# Patient Record
Sex: Male | Born: 1935 | Race: White | Hispanic: No | Marital: Married | Smoking: Former smoker
Health system: Southern US, Community
[De-identification: ages and names within clinical notes are randomized; demographics above are authoritative.]

## PROBLEM LIST (undated history)

## (undated) DIAGNOSIS — I509 Heart failure, unspecified: Secondary | ICD-10-CM

## (undated) DIAGNOSIS — I2510 Atherosclerotic heart disease of native coronary artery without angina pectoris: Secondary | ICD-10-CM

## (undated) DIAGNOSIS — E119 Type 2 diabetes mellitus without complications: Secondary | ICD-10-CM

## (undated) DIAGNOSIS — K219 Gastro-esophageal reflux disease without esophagitis: Secondary | ICD-10-CM

## (undated) DIAGNOSIS — F32A Depression, unspecified: Secondary | ICD-10-CM

## (undated) DIAGNOSIS — F419 Anxiety disorder, unspecified: Secondary | ICD-10-CM

## (undated) DIAGNOSIS — I10 Essential (primary) hypertension: Secondary | ICD-10-CM

## (undated) DIAGNOSIS — N183 Chronic kidney disease, stage 3 unspecified: Secondary | ICD-10-CM

## (undated) DIAGNOSIS — A159 Respiratory tuberculosis unspecified: Secondary | ICD-10-CM

## (undated) DIAGNOSIS — E1165 Type 2 diabetes mellitus with hyperglycemia: Secondary | ICD-10-CM

## (undated) DIAGNOSIS — J189 Pneumonia, unspecified organism: Secondary | ICD-10-CM

## (undated) DIAGNOSIS — F329 Major depressive disorder, single episode, unspecified: Secondary | ICD-10-CM

## (undated) DIAGNOSIS — E785 Hyperlipidemia, unspecified: Secondary | ICD-10-CM

## (undated) DIAGNOSIS — T6701XA Heatstroke and sunstroke, initial encounter: Secondary | ICD-10-CM

## (undated) DIAGNOSIS — M6200 Separation of muscle (nontraumatic), unspecified site: Secondary | ICD-10-CM

## (undated) DIAGNOSIS — N289 Disorder of kidney and ureter, unspecified: Secondary | ICD-10-CM

## (undated) DIAGNOSIS — C44301 Unspecified malignant neoplasm of skin of nose: Secondary | ICD-10-CM

## (undated) DIAGNOSIS — M545 Low back pain: Secondary | ICD-10-CM

## (undated) DIAGNOSIS — E1129 Type 2 diabetes mellitus with other diabetic kidney complication: Secondary | ICD-10-CM

## (undated) DIAGNOSIS — R972 Elevated prostate specific antigen [PSA]: Secondary | ICD-10-CM

## (undated) DIAGNOSIS — I359 Nonrheumatic aortic valve disorder, unspecified: Secondary | ICD-10-CM

## (undated) DIAGNOSIS — I119 Hypertensive heart disease without heart failure: Secondary | ICD-10-CM

## (undated) HISTORY — PX: SKIN CANCER DESTRUCTION: SHX778

## (undated) HISTORY — DX: Nonrheumatic aortic valve disorder, unspecified: I35.9

## (undated) HISTORY — DX: Disorder of kidney and ureter, unspecified: N28.9

## (undated) HISTORY — DX: Type 2 diabetes mellitus with hyperglycemia: E11.65

## (undated) HISTORY — DX: Gastro-esophageal reflux disease without esophagitis: K21.9

## (undated) HISTORY — DX: Hyperlipidemia, unspecified: E78.5

## (undated) HISTORY — DX: Atherosclerotic heart disease of native coronary artery without angina pectoris: I25.10

## (undated) HISTORY — DX: Low back pain: M54.5

## (undated) HISTORY — DX: Separation of muscle (nontraumatic), unspecified site: M62.00

## (undated) HISTORY — DX: Type 2 diabetes mellitus with other diabetic kidney complication: E11.29

## (undated) HISTORY — DX: Hypertensive heart disease without heart failure: I11.9

## (undated) HISTORY — DX: Elevated prostate specific antigen (PSA): R97.20

---
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
## 1999-03-03 HISTORY — PX: SP INTRO URET CATH PERC: HXRAD350

## 2000-03-18 ENCOUNTER — Inpatient Hospital Stay (HOSPITAL_COMMUNITY): Admission: EM | Admit: 2000-03-18 | Discharge: 2000-03-20 | Payer: Self-pay | Admitting: Emergency Medicine

## 2000-03-18 ENCOUNTER — Encounter: Payer: Self-pay | Admitting: Emergency Medicine

## 2000-03-22 ENCOUNTER — Ambulatory Visit (HOSPITAL_COMMUNITY): Admission: RE | Admit: 2000-03-22 | Discharge: 2000-03-22 | Payer: Self-pay | Admitting: Internal Medicine

## 2000-05-03 HISTORY — PX: CORONARY ANGIOPLASTY WITH STENT PLACEMENT: SHX49

## 2000-05-08 ENCOUNTER — Ambulatory Visit (HOSPITAL_COMMUNITY): Admission: RE | Admit: 2000-05-08 | Discharge: 2000-05-09 | Payer: Self-pay | Admitting: Cardiology

## 2000-10-31 HISTORY — PX: TRANSURETHRAL RESECTION OF PROSTATE: SHX73

## 2000-11-25 ENCOUNTER — Encounter (INDEPENDENT_AMBULATORY_CARE_PROVIDER_SITE_OTHER): Payer: Self-pay | Admitting: Specialist

## 2000-11-25 ENCOUNTER — Inpatient Hospital Stay (HOSPITAL_COMMUNITY): Admission: RE | Admit: 2000-11-25 | Discharge: 2000-11-27 | Payer: Self-pay | Admitting: Urology

## 2002-05-06 DIAGNOSIS — G4730 Sleep apnea, unspecified: Secondary | ICD-10-CM | POA: Insufficient documentation

## 2004-11-13 DIAGNOSIS — H5410 Blindness, one eye, low vision other eye, unspecified eyes: Secondary | ICD-10-CM | POA: Insufficient documentation

## 2004-11-13 DIAGNOSIS — N400 Enlarged prostate without lower urinary tract symptoms: Secondary | ICD-10-CM | POA: Insufficient documentation

## 2005-04-25 DIAGNOSIS — Z125 Encounter for screening for malignant neoplasm of prostate: Secondary | ICD-10-CM | POA: Insufficient documentation

## 2006-06-17 DIAGNOSIS — E785 Hyperlipidemia, unspecified: Secondary | ICD-10-CM | POA: Insufficient documentation

## 2007-06-27 ENCOUNTER — Ambulatory Visit: Payer: Self-pay | Admitting: Cardiology

## 2007-06-27 ENCOUNTER — Inpatient Hospital Stay (HOSPITAL_COMMUNITY): Admission: EM | Admit: 2007-06-27 | Discharge: 2007-06-30 | Payer: Self-pay | Admitting: Emergency Medicine

## 2007-06-30 ENCOUNTER — Encounter (INDEPENDENT_AMBULATORY_CARE_PROVIDER_SITE_OTHER): Payer: Self-pay | Admitting: Internal Medicine

## 2007-09-26 DIAGNOSIS — R609 Edema, unspecified: Secondary | ICD-10-CM | POA: Insufficient documentation

## 2007-09-30 DIAGNOSIS — R809 Proteinuria, unspecified: Secondary | ICD-10-CM | POA: Insufficient documentation

## 2010-08-10 DIAGNOSIS — G4730 Sleep apnea, unspecified: Secondary | ICD-10-CM

## 2010-08-10 DIAGNOSIS — E785 Hyperlipidemia, unspecified: Secondary | ICD-10-CM

## 2010-08-10 DIAGNOSIS — N183 Chronic kidney disease, stage 3 unspecified: Secondary | ICD-10-CM | POA: Insufficient documentation

## 2010-08-10 DIAGNOSIS — I2510 Atherosclerotic heart disease of native coronary artery without angina pectoris: Secondary | ICD-10-CM

## 2010-08-10 DIAGNOSIS — N400 Enlarged prostate without lower urinary tract symptoms: Secondary | ICD-10-CM

## 2010-08-10 DIAGNOSIS — I10 Essential (primary) hypertension: Secondary | ICD-10-CM

## 2010-08-10 DIAGNOSIS — M6200 Separation of muscle (nontraumatic), unspecified site: Secondary | ICD-10-CM

## 2010-08-10 DIAGNOSIS — L723 Sebaceous cyst: Secondary | ICD-10-CM

## 2010-08-10 DIAGNOSIS — R609 Edema, unspecified: Secondary | ICD-10-CM

## 2010-08-10 DIAGNOSIS — Z79899 Other long term (current) drug therapy: Secondary | ICD-10-CM

## 2010-08-10 DIAGNOSIS — R413 Other amnesia: Secondary | ICD-10-CM

## 2010-08-10 DIAGNOSIS — R972 Elevated prostate specific antigen [PSA]: Secondary | ICD-10-CM

## 2010-08-10 DIAGNOSIS — L570 Actinic keratosis: Secondary | ICD-10-CM

## 2010-08-10 DIAGNOSIS — R490 Dysphonia: Secondary | ICD-10-CM

## 2010-08-10 DIAGNOSIS — N289 Disorder of kidney and ureter, unspecified: Secondary | ICD-10-CM

## 2010-08-10 DIAGNOSIS — Z125 Encounter for screening for malignant neoplasm of prostate: Secondary | ICD-10-CM

## 2010-08-10 DIAGNOSIS — R809 Proteinuria, unspecified: Secondary | ICD-10-CM

## 2010-08-10 DIAGNOSIS — E1165 Type 2 diabetes mellitus with hyperglycemia: Secondary | ICD-10-CM

## 2010-08-15 NOTE — H&P (Signed)
NAMEGABRIELLA, GUILE                ACCOUNT NO.:  [id number]   MEDICAL RECORD NO.:  [id number]          PATIENT TYPE:  EMS   LOCATION:  MAJO                         FACILITY:  [facility]   PHYSICIAN:  Lonia Blood, M.D.      DATE OF BIRTH:  February 20, 1929   DATE OF ADMISSION:  06/27/2007  DATE OF DISCHARGE:                              HISTORY & PHYSICAL   PRIMARY CARE PHYSICIAN:  Lenon Curt. Chilton Si, M.D.   PRESENTING COMPLAINT:  Dizziness.   HISTORY OF PRESENT ILLNESS:  The patient is a 77 year old gentleman with  a history of hypertension and diabetes who has apparently been on a diet  for the past 4 months.  He has lost about 40 pounds within this period.  In the last month alone he has lost about 15 pounds.  The patient has  experienced slight dizziness in the past 3 weeks.  He went to see his  primary care physician today, that is Dr. Chilton Si, who reviewed his  medications.  At that time his blood pressure systolic according to the  patient was about 100.  While in the office the dose of his Hyzaar was  halved secondary to presumed low blood pressure and he was asked to  return in about a month.  After leaving the office the patient felt  dizzy and weak.  He checked his blood pressure at home and the systolic  blood pressure was in the 80s.  He called his doctor and was asked to  come to the hospital.  Here in the ED his blood pressure on arrival was  found to be 88/48.  He denied any chest pain.  No fever.  No recent  nausea, vomiting or diarrhea.  No abdominal pain.  The patient denied  any falls.   PAST MEDICAL HISTORY:  Significant for dyslipidemia, hypertension,  diabetes and reflux.   ALLERGIES:  He has no known drug allergies.   MEDICATIONS:  Include Hyzaar, atenolol, metformin, omeprazole and  simvastatin.   SOCIAL HISTORY:  The patient lives with his wife.  He denied any tobacco  or alcohol or IV drug use.   FAMILY HISTORY:  Is noncontributory due to the patient's age.   REVIEW OF SYSTEMS:  A 12 point review of systems performed is  essentially negative except per HPI.   PHYSICAL EXAMINATION:  VITAL SIGNS:  On his exam temperature 98.6.  Initial blood pressure 88/48, blood pressure has dropped to 73/41, pulse  68, respiratory rate of 12, sats 96% on room air.  GENERAL:  He is a pleasant man in no acute distress.  HEENT:  PERRL.  EOMI.  NECK:  Neck is supple.  No JVD, no lymphadenopathy.  RESPIRATORY:  He has good air entry bilaterally.  No wheezes, no rales.  CARDIOVASCULAR:  He has S1-S2.  No murmurs.  ABDOMEN:  Abdomen is soft, nontender with positive bowel sounds.  EXTREMITIES:  Extremities show no edema, cyanosis or clubbing.   LABS:  White count is 8.2, hemoglobin 13.0, platelet count 186.  Initial  cardiac enzymes negative.  Sodium 137, potassium 3.4, chloride 102,  BUN  35, creatinine 2.7, glucose 126, calcium ionized 0.99.  Chest x-ray  shows old granulomatous disease but no acute findings.  EKG shows normal  sinus rhythm with a rate of 71, poor R-wave progression and slightly low  voltage but no ST-T wave changes.  EKG is unchanged from previous in  August 2002.   ASSESSMENT:  Therefore, this is a 77 year old gentleman presenting with  hypotension.  His hypotension is most likely secondary to multiple blood  pressure medications in the setting of weight loss.  The fact that the  patient has lost significant weight in the past 4 months makes him  require less medication to control his blood pressure and most likely  this contributed to this hypotension.  He also has an element of acute  renal failure as according to the patient his labs have always been  fine.  We do not have an old creatinine but 2.7 is today.   PLAN:  1. Hypotension.  Will admit the patient, hold all antihypertensive      medications.  Hydrate him extensively with boluses of normal saline      and continue at high dose of 250 an hour.  We will check serial      cardiac  enzymes to rule out other differentials including acute      coronary syndrome.  Will also follow his renal function closely.  2. Acute renal failure.  Again, I believe this is prerenal.  Will      hydrate the patient and recheck his renal function.  If there is no      improvement we will do a full renal workup.  3. Hypokalemia.  This seems to be probably primarily from his      diuresis.  I will therefore replete his potassium.  4. Dyslipidemia.  Will check fasting lipid panel and continue with his      home medications as necessary.  5. Gastroesophageal reflux disease.  I will continue with his PPI in      the hospital.   Further treatment will depend on how the patient responds to initial  treatment in the hospital.      Lonia Blood, M.D.  Electronically Signed     LG/MEDQ  D:  06/27/2007  T:  06/29/2007  Job:  [id number]

## 2010-08-15 NOTE — Discharge Summary (Signed)
Jimmy Dawson, PRIES                ACCOUNT NO.:  [id number]   MEDICAL RECORD NO.:  [id number]          PATIENT TYPE:  INP   LOCATION:  6742                         FACILITY:  [facility]   PHYSICIAN:  Marcellus Scott, MD     DATE OF BIRTH:  08-25-1928   DATE OF ADMISSION:  06/27/2007  DATE OF DISCHARGE:  06/30/2007                               DISCHARGE SUMMARY   PRIMARY CARE PHYSICIAN:  Lenon Curt. Chilton Si, M.D.   CARDIOLOGIST:  Jaclyn Prime. Lucas Mallow, M.D.   DISCHARGE DIAGNOSES:  1. Hypotension.  2. Acute renal failure - resolved.  3. Coronary artery disease status post stent.  4. An episode of 8 beats nonsustained ventricular tachycardia.  5. Type 2 diabetes mellitus.  6. Hyperlipidemia.  7. Anemia.  8. Urinary tract infection.   DISCHARGE MEDICATIONS:  1. Atenolol 50 mg p.o. daily.  2. Metformin 500 mg p.o. daily.  3. Omeprazole 40 mg p.o. daily.  4. Simvastatin 80 mg p.o. daily.  5. Lasix 40 mg p.o. p.r.n.  6. Aspirin enteric-coated 325 mg p.o. daily.  7. Multivitamins one p.o. daily.  8. Metamucil.  9. Cipro 250 mg p.o. twice daily for 3 days.   MEDICATION THAT IS HELD:  Hyzaar.   PROCEDURE:  1. 2-D echocardiogram.  Summary:  Overall left ventricular systolic      function was normal.  Left ventricular ejection fraction was      estimated at range being 55-60%.  There were no left ventricular      regional wall motion abnormalities.  Left ventricular wall      thickness was mildly increased.  Noncoronary cusp appears to be      calcified with reduced excursions.  There was trivial aortic      valvular regurgitation.  There was mild mitral annular      calcification.  The left atrium was mildly dilated.  The estimated      peak pulmonary artery systolic pressure was mildly increased.  2. Chest x-ray on June 27, 2007.  Impression:  Old granulomatous      disease.  No acute thoracic findings are identified.   PERTINENT LABORATORY DATA:  Urine culture greater than 100,000 E coli.  Basic metabolic panel today was unremarkable with BUN 13, creatinine  0.95.  CBC with hemoglobin 11.9, hematocrit 35.3, white blood cell 6.9,  platelets 159.  Troponin was negative and so were the rest of the  cardiac enzymes.  Serum cortisol 5.8.  TSH 1.454.  Hemoglobin A1c 6.7.  Lipid panel with cholesterol 82, triglyceride 61, HDL 22, LDL 48, VLDL  is 12.  BNP of less than 30.  Urinalysis with small leukocytes, 3-6  white blood cells and rare bacteria.   CONSULTATIONS:  None.   HOSPITAL COURSE AND PATIENT DISPOSITION:  Please refer to the history  and physical note for initial admission details.  In summary, Jimmy Dawson  is a 77 year old gentleman with history of hypertension, diabetes,  coronary artery disease who has been dieting for 4 months and has  intentionally lost about 40 pounds of weight.  He had been experiencing  dizziness in the past 3 weeks and saw his primary care physician who  reviewed his medications, and his blood pressure was noted to be 100  mmHg systolic following which his Hyzaar was reduced and he was asked to  return in a month's time.  The patient indicates that over the last few  weeks, the patient has been running low blood pressures in the 80s even  at home.  In any event, on further evaluation in the ED, he was found to  have a blood pressure of 88/48 and even dropped down to 73/41 mmHg, and  he had acute renal failure with BUN of 35, creatinine of 2.7.  Following  this, the patient was admitted to the hospital for further evaluation  and management.   Problem 1.  Hypotension.  This was most likely secondary to his  antihypertensive medications and dehydration.  The patient's  antihypertensive medications were held.  The patient was hydrated with  IV normal saline with improvement in his blood pressures.  Then his  atenolol was started at a low-dose which he has tolerated and no further  symptoms.   Problem 2.  Acute renal failure secondary to ARB and  dehydration.  The  patient's medications were held.  The patient was hydrated with IV  fluids, and there was resolution of his acute renal failure.   Problem 3.  Coronary artery disease status post stent.  No symptoms.  Echocardiogram was done with good LV function.  The patient is to follow  up with his cardiologist and continue his home medications.   Problem 4.  Episode of nonsustained ventricular tachycardia,  asymptomatic.  The patient's electrolytes were checked and repleted as  needed.  His echocardiogram was checked, and he has normal LV function.  The patient is to continue his beta blockers and follow up with his  cardiologist.   Problem 5.  Type 2 diabetes.  Will need tighter control as an  outpatient.   Problem 6.  Urinary tract infection in a male patient with significant  urine analysis.  Will treat empirically with 3 days of Cipro and  followup.      Marcellus Scott, MD  Electronically Signed     AH/MEDQ  D:  06/30/2007  T:  06/30/2007  Job:  [id number]   cc:   Lenon Curt Chilton Si, M.D.  Jaclyn Prime. Lucas Mallow, M.D.

## 2010-08-18 NOTE — Cardiovascular Report (Signed)
[place]. [facility]  Patient:    Jimmy Dawson, Jimmy Dawson                  MRN: [id number] Proc. Date: 05/08/00 Adm. Date:  [id number] Attending:  Clovis Cao CC:         Trudee Kuster, M.D.  Cath Lab   Cardiac Catheterization  PROCEDURE: 1. Left heart catheterization. 2. Coronary coronary angiography. 3. Left internal mammary artery coronary angiography. 4. Left ventricular coronary angiography. 5. Abdominal aortogram. 6. Angioplasty with PTCA and stent placement of the mid LAD. 7. Perclose of the right femoral artery.  INDICATIONS:  This 77 year old male patient of Dr. Jearl Klinefelter had the recent finding of an abnormal EKG.  An echocardiogram done at [facility] showed left ventricular dysfunction in the anterior segment.  He denies any history of chest pain.  He has a history of hypertension, mild diabetes, and mild problems with his cholesterol.  He has a positive family history of coronary artery disease.  DESCRIPTION OF PROCEDURE:  After signing an informed consent, the patient was premedicated with 50 mg of Benadryl intravenously and brought to the cardiac catheterization lab.  His right groin was prepped and draped in the usual sterile fashion and anesthetized locally with 1% lidocaine.  A 6 French introducer sheath was inserted percutaneously into the right femoral artery. A 6 French #4 Judkins coronary catheters were used to make injections into the coronary arteries.  The right coronary catheter was used to make a midstream injection into the left subclavian artery visualizing the left internal mammary artery.  A 6 French pigtail catheter was used to measure pressures in the left ventricle and aorta and to make midstream injections into the left ventricle and abdominal aorta.  After noting total occlusion of his mid-LAD we discussed this finding with the patient and with his permission, we proceeded with an angioplasty  procedure.  We selected a 6 [place] guide catheter which was advanced to the root of the aorta.  The Hi-Torque floppy guide wire was then inserted through the guide catheter and advanced into the LAD.  After mild difficulty it was passed through the totally occluded lesion in the mid LAD and safely positioned in the distal LAD.  We then selected a 3.0 x 15 mm crossail balloon catheter which was advanced over the guide wire and positioned within the LAD lesion.  Two inflations were made, the first at 6 atmospheres for 18 seconds and the second at 12 atmospheres for 30 seconds. After this second inflation of the balloon catheter, it was removed and injections showed a partial opening and reestablishment of slow antegrade flow.  Nitroglycerin 100 units intracoronary was then given into the LAD.  We then selected a 3.5 x 13 mm Penta stent deployment system.  After proper preparation, it was inserted over the guide wire in position within the mid LAD lesion.  The stent was then deployed with one inflation at 18 atmospheres for 27 seconds.  After the stent was deployed, the deployment balloon was removed and injection again into the LAD showed an excellent angiographic result with 0% residual lesion and reestablishment of normal Timi 3 antegrade flow.  There was no evidence of clot and no evidence of dissection.  Also noted was a large first anterolateral branch just prior to this lesion which has a severe 95% stenosis at its ostium.  Multiple attempts were made at passing the guide wire into this first anterolateral branch without  success. At the end of the procedure, the catheter and sheath were removed from the right femoral artery and hemostasis was easily obtained with a Perclose closure system.  MEDICATIONS GIVEN:  Heparin 5000 units IV, Integrilin IV bolus and drip per pharmacy protocol.  Nitroglycerin intracoronary 100 units.  HEYMODYNAMIC DATA:  Left ventricular pressure 140/20-38,  aortic pressure 140/74 with a mean of 103.  Left ventricular ejection fraction was estimated at 40%.  CINE FINDINGS:  Coronary coronary angiography.  Left coronary artery.  The ostium and left main appear normal.  Left anterior descending.  The mid LAD is totally occluded and the mid and distal segment is filled retrograde during injections into the right coronary artery.  The first two septal branches appear normal.  The first diagonal branch has a severe 95% stenosis at its ostium.  There was slow antegrade flow.  There were 0 antegrade flow in the mid LAD.  Circumflex coronary artery.  The circumflex coronary artery has a mildly dilated area of ectasia in its middle segment, otherwise, the circumflex appears normal with very good antegrade flow.  Right coronary artery.  The right coronary artery is mildly irregular in its proximal segment.  There are several mild areas of ectasia in the proximal and middle segment.  There is no significant stenotic area.  The distal right coronary artery appears normal and is a large dominant vessel supplying the posterior descending and posterolateral circulation.  The mid distal and second anterolateral branch of the LAD fill retrograde during injections into the right coronary artery.  Left internal mammary artery.  Appears normal.  Left ventricular cineangiogram.  The left ventricular chamber size is mildly enlarged.  The overall left ventricular contractility is mild to moderately decreased with an ejection fraction estimated at approximately 40%.  There is moderate to severe anterior hypokinesia.  Abdominal aortogram.  The abdominal aorta and renal arteries appear normal.  ANGIOPLASTY CINES:  Cines taken during the angioplasty procedure shows proper positioning of the guide wire through the totally occluded mid LAD lesion. Further cines shows partial opening after the balloon angioplasty with Timi 2  antegrade flow.  Further cines  showed proper positioning of the stent deployment system and final injections into the LAD shows an excellent angiographic result with proper positioning of the stent with full deployment and reestablishment of normal Timi 3 antegrade flow.  There is no evidence of dissection or clot.  The first anterolateral branch remained unchanged and no crossing was made with the guide wire.  FINAL DIAGNOSES: 1. Single vessel coronary artery disease with total occlusion of the mid    left anterior descending and 95% lesion in the first diagonal branch. 2. Normal left internal mammary artery. 3. Mild ectasia in the circumflex and right coronary arteries. 4. Moderate left ventricular dysfunction with ejection fraction of    approximately 40% and severe anterior hypokinesia. 5. Normal abdominal aorta and renal arteries. 6. Successful angioplasty with percutaneous transluminal coronary angioplasty    and stenting of the mid left anterior descending lesion. 7. Unsuccessful attempt to cross the first anterolateral lesion. 8. Successful Perclose of the right femoral artery.  DISPOSITION:  The patient was admitted to 6500 for monitoring overnight and will plan to discharge in the a.m. DD:  05/08/00 TD:  05/09/00 Job: [id number]

## 2010-08-18 NOTE — Discharge Summary (Signed)
[place]. [facility]  Patient:    Jimmy Dawson, Jimmy Dawson                  MRN: [id number] Adm. Date:  [id number] Attending:  Clovis Cao CC:         Aram Candela. Aleen Campi, M.D.  Trudee Kuster, M.D.  Veverly Fells. Vernie Ammons, M.D.   Discharge Summary  CHIEF COMPLAINT:  This 77 year old male, patient of Dr. Jearl Klinefelter, was admitted because of recent abnormal findings, including abnormal electrocardiography, shortness of breath with exertion, as well as a strong family history of coronary artery disease.  In light of these findings, he was admitted for cardiac catheterization rather than undergoing stress test beforehand.  PAST MEDICAL HISTORY: FAMILY HISTORY: SOCIAL HISTORY: REVIEW OF SYSTEMS:  Included in the history and physical and copy of the outside consult note to be found in the chart.  PHYSICAL EXAMINATION:  VITAL SIGNS: Blood pressure 120/70, pulse 75 and regular, respirations 18 and unlabored. GENERAL: He was a well-developed, well-nourished man in no acute distress.  His respiratory effort was normal. The apical impulse was cryptic.  The left border of the cardiac illness was ill-defined.  The heart rate, which had been 100 beats per minute when he was seen in the office, was down to 75.  There was no gallop sound.  The remainder of the physical examination was unremarkable.  LABORATORY DATA:  Unremarkable.  HOSPITAL COURSE:  The patient was taken to the cardiac catheterization on May 08, 2000, by Dr. Aleen Campi and cardiac catheterization revealed single vessel coronary artery disease with total occlusion of the mid left anterior descending and 95% stenosis of the first diagonal.  There was moderate left ventricular dysfunction.  Ejection fraction was estimated at 40%, as had been the case in the echocardiogram.  PROCEDURE:  There was successful PTA and stenting of the mid LAD.  There was an unsuccessful attempt to cross the first diagonal  lesion.  There was successful Perclose of the right femoral artery and postoperative antiplatelet therapy was carried out with double bolus Integrilin.  The patient had minimal oozing.  There was Perclose closure.  His postoperative course was unremarkable.  The following morning, he walked well.  He noted that he could already feel a difference in his breathing, and that he can now take deep breaths more easily than he could previously.  The patient was considered stable and ready for discharge after walking without destabilization of the groin.  FINAL DIAGNOSES: 1. Congestive heart failure. 2. Coronary artery disease. 3. Benign prostatic hypertrophy, followed by Dr. Vernie Ammons.  RECOMMENDATIONS:  Diet; cholesterol lowering diet.  Activity; walking as tolerated.  He is to do no heavy lifting for three days.  Wound care; he may drive short distances, especially important in view of his wifes current invalid status.  Smoking; do not smoke.  The essential nature of [place] great objections to his smoking are strongly emphasized.  DISCHARGE MEDICATIONS: 1. Plavix 75 mg daily. 2. Aspirin 325 mg daily. 3. Darvocet-N 100 as needed for pain (he has previously had back pain, but    ibuprofen is now inappropriate). 4. Toprol XL 25 mg daily. 5. Altace 5 mg daily. 6. Proscar. 7. Flomax.  FOLLOW-UP:  With Dr. Lucas Mallow in one week.  CONDITION ON DISCHARGE:  Improved and stable.  PROCEDURE:  Cardiac catheterization with PTCA and stent as noted above. Complications from operation none. DD:  05/09/00 TD:  05/09/00 Job: [id number]

## 2010-08-18 NOTE — Consult Note (Signed)
[place]. [facility]  Patient:    Jimmy Dawson, Jimmy Dawson                  MRN: [id number] Proc. Date: 03/18/00 Adm. Date:  [id number] Attending:  Thedore Mins CC:         Trudee Kuster, M.D.   Consultation Report  REASON FOR CONSULTATION:  Urinary retention.  BRIEF HISTORY:  This nice 77 year old male is admitted to the hospital at this time for acute urinary retention.  He was recently seen in [facility] office for bronchitis.  The patient had a new patient evaluation at that time as well.  He did have mild to moderate voiding symptomatology, specifically nocturia x 2-3, somewhat decreased force of stream, frequency, and mild urgency.  He denies any prior history of kidney infections, bladder infections, or urologic problems other than one past kidney stone and erectile dysfunction.  He was seen within the past two to three years by a urologist in [place]. Apparently his prostate "checked out all right."  After being on Detrol for urinary frequency and being placed on a decongestant the patient developed decreased urinary flow last week and eventually, by Friday, was having difficulty urinating.  He was taken off the Detrol at that time.  A PSA was drawn then returning 22.4.  He continued to have voiding problems over the weekend and presented to Dr. Antonietta Breach office today.  He was sent to the emergency room where a catheter was placed and over 2 L was obtained.  He is admitted at this time for further evaluation and management of an elevated PSA, acute urinary retention, and a possible clinical picture of bowel obstruction.  PHYSICAL EXAMINATION:  GENERAL:  Very pleasant elderly male.  He was eating a liquid diet, sitting at bed side.  VITAL SIGNS:  Blood pressure 130/73, pulse 88, respiratory rate 20.  He is afebrile.  ABDOMEN:  Obese and somewhat distended with tympany in the upper abdomen. There was no rebound,  guarding, or masses.  No hepatosplenomegaly was present. There were no inguinal hernias.  There was no flank masses or CVA tenderness.  GENITOURINARY:  Phallus was uncircumcised.  No plaques, lesions, or fibrotic areas were noted.  Foreskin was easily retracted without lesions.  There was a Foley catheter present at the meatus.  RECTAL:  Normal sphincter tone.  Gland 50 g.  No tenderness.  No nodularity. Nothing suspicious in nature was present on this examination.  IMPRESSION: 1. Acute urinary retention, resolved with Foley catheter placement. 2. Hematuria most likely due to over distention of bladder and mucosal    bleeding, should resolve. 3. Elevated PSA.  I feel that this is most likely due to acute urinary    retention as this was taken Friday after the patient had been on his    anticholinergic and decongestant. 4. Benign prostatic hypertrophy with mild to moderate symptomatology prior to    recent medical management.  PLAN: 1. Stop anticholinergic (Detrol) and decongestant. 2. Start Flomax 0.4 mg q.d.  This will provide an alpha blockade and decrease    outlet resistance. 3. Leave catheter in two to three days.  I think that we should give him a    voiding trial after that with removing the catheter in the morning.  Most    likely this will be able to be done with the patient at home if he goes    home within the next day or two. 4.  Repeat PSA in three to four weeks.  With the PSA bumping up with the    retention and now with urethral catheterization, I feel it is most likely    best to wait quite a while.  I would expect that this PSA will go back down    to normal.  I am not suspicious for prostate cancer at this time. DD:  03/19/00 TD:  03/19/00 Job: [id number]

## 2010-08-18 NOTE — Op Note (Signed)
[facility]  Patient:    Jimmy Dawson, Jimmy Dawson Visit Number: [id number] MRN: [id number]          Service Type: SUR Location: 3W 0349 01 Attending Physician:  Liborio Nixon Proc. Date: 11/25/00 Adm. Date:  11/25/2000   CC:         Lenon Curt. Cassell Clement, M.D.  Jaclyn Prime. Lucas Mallow, M.D.   Operative Report  PREOPERATIVE DIAGNOSIS:  Benign prostatic hypertrophy with retention.  POSTOPERATIVE DIAGNOSIS:  Benign prostatic hypertrophy with retention.  PRINCIPAL PROCEDURE:  TURP.  SURGEON:  Bertram Millard. Dahlstedt, M.D.  ANESTHESIA:  Subarachnoid block.  COMPLICATIONS:  None.  BRIEF HISTORY:  A 77 year old man, who presented to my attention in December of last year with retention.  At that time, he was on an anticholinergic and an alpha agonist for upper respiratory symptoms.  Despite being off of both of these medications, he remained in retention for some time.  He was on intermittent catheterization.  This eventually resolved over a period of a month or two.  However, the patient has had significant urinary symptoms since that time.  His treatment has necessitated Flomax and Proscar for maximal medical management.  Despite this, he has persistent, severe urinary symptoms and desires management.  The risks and complications as well as alternatives of TURP were discussed with the patient.  He understands these and desires to proceed.  DESCRIPTION OF PROCEDURE:  The patient was administered a subarachnoid block. Preoperative IV antibiotics were administered, and he was placed in the dorsal lithotomy position.  Genitalia and perineum were prepped and draped.  The urethra was calibrated to 32 [place] with [employer].  Resectoscope sheath was placed with the element.  Resection was begun at the 12 oclock position from bladder neck down to the verumontanum area.  Visualization was then afforded of both ureteral orifices which were right near a  large, intravesical median lobe.  The median lobe was resected down to the surgical capsule, taking care not to injure either ureteral orifice.  The right and left lateral lobes were then resected down to the surgical capsule, starting anteriorly and working posteriorly.  In this manner, the prostatic fossa opened up quite nicely.  Small bleeders were electrocoagulated.  Chips were removed from the bladder.  The prostate and bladder were reinspected.  No chips were seen, and no bleeding was seen in the prostatic fossa.  At this point, the scope was removed, 24 [place] three-way Foley catheter was placed and the balloon filled with 30 cc of saline.  This was hooked to overhead irrigation.  The patient tolerated the procedure well.  He was taken to PACU in stable condition. Attending Physician:  Liborio Nixon DD:  11/25/00 TD:  11/26/00 Job: [id number]

## 2010-08-18 NOTE — H&P (Signed)
[facility]  Patient:    Jimmy Dawson, Jimmy Dawson                  MRN: [id number] Adm. Date:  [id number] Attending:  Thedore Mins                         History and Physical  CHIEF COMPLAINT: Urinary retention.  HISTORY OF PRESENT ILLNESS: This patient is a 77 year old man seen in the outpatient setting three days ago for complaint of inability to void.  The patient had been started on Detrol approximately two days prior as he was complaining of urinary frequency and some nocturia with no obvious cause.  The patient had examination at that time which did not show any gross abnormalities.  The patient was told to stop the Detrol and we would follow this up.  An attempt was made at in-and-out catheterization during the outpatient visit but this was unsuccessful.  The patient was told to return or come to the ED if he continued to have voiding difficulties, which he in fact did continue to have.  The patient came to the emergency department and after successful Foley catheter placement 2300 cc of urine was obtained.  The patient has also been complaining of inability to have a bowel movement over the past several days to a week.  The patient denies any melena or hematochezia.  The patient denies any nausea or vomiting.  PAST MEDICAL HISTORY: None.  MEDICATIONS:  1. Allegra p.r.n.  2. Recent Detrol samples given.  FAMILY HISTORY: No history of malignancy.  Father had coronary artery disease. Siblings with diabetes and coronary artery disease.  SOCIAL HISTORY: The patient does not use tobacco or alcohol.  He is married. He was a Psychologist, forensic.  REVIEW OF SYSTEMS: As above, the patient had been having some urinary urgency prior to samples of Detrol.  The patient denies any chest pain or shortness of breath.  The patient denies any changes in his weight.  The patient did have episodes of constipation over the past week but typically had normal  bowel habits.  PHYSICAL EXAMINATION:  VITAL SIGNS: The patient is afebrile with stable vital signs.  HEENT: Head normocephalic.  PERRL.  HEART: Regular rhythm.  No murmurs, rubs, or gallops.  LUNGS: Clear bilaterally.  ABDOMEN: Distended, with positive bowel sounds.  EXTREMITIES: No clubbing, cyanosis, or edema.  GU/RECTAL: Prostate smooth and round, no obvious mass per my examination or per examination of EDP.  Hematuria in Foley bag after initial clear urine. LABORATORY DATA: KUB shows distended bowel.  CT scan was done without IV contrast secondary to creatinine of 2.3 but no masses were shown, but a bowel obstruction or dilatation was evident.  PSA level from March 15, 2000 was 22.4.  ASSESSMENT/PLAN:  1. Urinary retention.  The patient had urinary retention which is probably a     combination of an enlarged prostate and Detrol.  The patient did retain     2300 cc of urine so a Foley will need to be placed and remain for several     days.  Urology service consultation will be obtained to clarify issues of     prostatic specific antigen being so increased, and the patients symptoms.     The patient probably will need to have some type of study performed by     urology and I will defer to their opinion.  2. Bowel obstruction.  This is of some concern, as the patient could possibly     have prostate cancer.  The patient does have positive bowel sounds and did     have a bowel movement prior to my examining him.  For now we will place     the patient on a clear liquid diet and advance this as tolerated.     Should this problem continue, then the patient may need general surgery     service evaluation.  3. Acute renal insufficiency.  The patient has a creatinine of 2.3 on the     obtained i-STAT.  His initial creatinine was 1.6 in my office.  The     patient has no history of renal problems, so the most likely culprit is     acute retention/obstruction.  We will follow this  during his     hospitalization.  I suspect this will resolve with hydration and time. D:  03/18/00 TD:  03/19/00 Job: [id number]

## 2010-08-18 NOTE — Discharge Summary (Signed)
[facility]  Patient:    TREVIOUS, RAMPEY Visit Number: [id number] MRN: [id number]          Service Type: SUR Location: 3W 0349 01 Attending Physician:  Liborio Nixon Dictated by:   Bertram Millard. Dahlstedt, M.D. Admit Date:  11/25/2000 Discharge Date: 11/27/2000                             Discharge Summary  PRIMARY DIAGNOSES: 1. Benign prostatic hypertrophy. 2. Retention, specified. 3. Coronary artery disease. 4. History of old myocardial infarction. 5. Diabetes mellitus type 2, controlled. 6. Hypertension.  PRINCIPAL PROCEDURE:  November 25, 2000: Transurethral resection of prostate.  BRIEF HISTORY:  This is an elderly male with obstructive uropathy. He has been followed by Dr. Retta Diones for quite a few months for urinary retention which eventually resolved. He still has significant lower urinary tract symptoms and is on both Flomax and Proscar for maximal medical management. He has persistent severe voiding symptoms and at this time, he desires surgical management. He is aware of transurethral resection of the prostate, as well a other alternatives including incision of the prostate, microwave and radio frequency ablation. He desires to proceed with the surgical management.  HOSPITAL COURSE:  The patient was admitted to Dr. Barbra Sarks service and underwent transurethral resection of the prostate. He tolerated the procedure well. Postoperative course was uncomplicated. He was treated first with bladder irrigations, and then with just catheter drainage. His catheter was removed on postoperative day #2. At that time, he was voiding well without difficulty.  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  As he was doing well, he was discharged from the hospital.  DISCHARGE FOLLOWUP:   He will follow up with Dr. Retta Diones in approximately two  weeks.  CONDITION ON DISCHARGE:  Improved.  ACTIVITY:  Discharge on a limited activities.  DISCHARGE MEDICATIONS:  Regular preoperative medications plus Bactrim DS one p.o. q.d. x 10 days. Dictated by:   Bertram Millard. Dahlstedt, M.D. Attending Physician:  Liborio Nixon DD:  12/18/00 TD:  12/18/00 Job: [phone]

## 2010-12-25 LAB — CBC
HCT: 35.3 — ABNORMAL LOW
Hemoglobin: 12.1 — ABNORMAL LOW
MCHC: 33.6
MCV: 88
MCV: 88.6
Platelets: 159
Platelets: 186
RBC: 4.03 — ABNORMAL LOW
RDW: 13.3
RDW: 13.4
RDW: 13.6
WBC: 6.9
WBC: 8.2

## 2010-12-25 LAB — POCT I-STAT, CHEM 8
HCT: 39
Hemoglobin: 13.3
Potassium: 3.4 — ABNORMAL LOW
Sodium: 137
TCO2: 26

## 2010-12-25 LAB — BASIC METABOLIC PANEL
BUN: 13
BUN: 14
CO2: 30
Calcium: 8.2 — ABNORMAL LOW
Calcium: 8.9
Chloride: 105
Creatinine, Ser: 0.95
Creatinine, Ser: 1.13
GFR calc Af Amer: 49 — ABNORMAL LOW
GFR calc Af Amer: >60
GFR calc non Af Amer: 40 — ABNORMAL LOW
GFR calc non Af Amer: >60
Glucose, Bld: 137 — ABNORMAL HIGH
Sodium: 136

## 2010-12-25 LAB — LIPID PANEL
Cholesterol: 82
HDL: 22 — ABNORMAL LOW
LDL Cholesterol: 48
Total CHOL/HDL Ratio: 3.7

## 2010-12-25 LAB — TROPONIN I: Troponin I: 0.03

## 2010-12-25 LAB — DIFFERENTIAL
Basophils Absolute: 0.1
Basophils Relative: 1
Eosinophils Absolute: 0.1
Lymphs Abs: 2.1
Neutrophils Relative %: 63

## 2010-12-25 LAB — URINE MICROSCOPIC-ADD ON

## 2010-12-25 LAB — TSH: TSH: 1.454

## 2010-12-25 LAB — CORTISOL-AM, BLOOD: Cortisol - AM: 5.8

## 2010-12-25 LAB — URINALYSIS, ROUTINE W REFLEX MICROSCOPIC
Hgb urine dipstick: NEGATIVE
Specific Gravity, Urine: 1.021
pH: 5

## 2010-12-25 LAB — CK TOTAL AND CKMB (NOT AT ARMC)
CK, MB: 2.3
Relative Index: 1.7
Total CK: 121

## 2010-12-25 LAB — URINE CULTURE

## 2010-12-25 LAB — POCT CARDIAC MARKERS: CKMB, poc: <1 — ABNORMAL LOW

## 2010-12-25 LAB — MAGNESIUM: Magnesium: 2.1

## 2010-12-25 LAB — HEMOGLOBIN A1C: Mean Plasma Glucose: 161

## 2012-03-15 ENCOUNTER — Encounter: Payer: Self-pay | Admitting: Internal Medicine

## 2012-06-18 ENCOUNTER — Ambulatory Visit (INDEPENDENT_AMBULATORY_CARE_PROVIDER_SITE_OTHER): Payer: Federal, State, Local not specified - PPO | Admitting: Internal Medicine

## 2012-06-18 ENCOUNTER — Encounter: Payer: Self-pay | Admitting: Internal Medicine

## 2012-06-18 VITALS — BP 118/68 | HR 100 | Temp 98.2°F | Resp 17 | Ht 67.0 in | Wt 190.2 lb

## 2012-06-18 DIAGNOSIS — E785 Hyperlipidemia, unspecified: Secondary | ICD-10-CM

## 2012-06-18 DIAGNOSIS — E1129 Type 2 diabetes mellitus with other diabetic kidney complication: Secondary | ICD-10-CM | POA: Insufficient documentation

## 2012-06-18 DIAGNOSIS — E119 Type 2 diabetes mellitus without complications: Secondary | ICD-10-CM

## 2012-06-18 DIAGNOSIS — F0789 Other personality and behavioral disorders due to known physiological condition: Secondary | ICD-10-CM

## 2012-06-18 DIAGNOSIS — R413 Other amnesia: Secondary | ICD-10-CM

## 2012-06-18 DIAGNOSIS — I10 Essential (primary) hypertension: Secondary | ICD-10-CM

## 2012-06-18 DIAGNOSIS — IMO0002 Reserved for concepts with insufficient information to code with codable children: Secondary | ICD-10-CM

## 2012-06-18 HISTORY — DX: Reserved for concepts with insufficient information to code with codable children: IMO0002

## 2012-06-18 HISTORY — DX: Type 2 diabetes mellitus with other diabetic kidney complication: E11.29

## 2012-06-18 NOTE — Assessment & Plan Note (Signed)
Recheck MMSE next visit.

## 2012-06-18 NOTE — Assessment & Plan Note (Signed)
Recheck lab next visit.

## 2012-06-18 NOTE — Assessment & Plan Note (Signed)
Continue current medications. 
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
## 2012-06-18 NOTE — Assessment & Plan Note (Signed)
Controlled adequately. Continue current medications

## 2012-06-18 NOTE — Patient Instructions (Signed)
Continue current medications. 

## 2012-10-09 ENCOUNTER — Encounter (HOSPITAL_COMMUNITY): Payer: Self-pay | Admitting: *Deleted

## 2012-10-09 ENCOUNTER — Emergency Department (HOSPITAL_COMMUNITY): Payer: Federal, State, Local not specified - PPO

## 2012-10-09 ENCOUNTER — Emergency Department (HOSPITAL_COMMUNITY)
Admission: EM | Admit: 2012-10-09 | Discharge: 2012-10-09 | Disposition: A | Discharge status: Home / Self Care | Payer: Federal, State, Local not specified - PPO | Attending: Emergency Medicine | Admitting: Emergency Medicine

## 2012-10-09 DIAGNOSIS — Z7982 Long term (current) use of aspirin: Secondary | ICD-10-CM | POA: Insufficient documentation

## 2012-10-09 DIAGNOSIS — K219 Gastro-esophageal reflux disease without esophagitis: Secondary | ICD-10-CM | POA: Insufficient documentation

## 2012-10-09 DIAGNOSIS — Y9389 Activity, other specified: Secondary | ICD-10-CM | POA: Insufficient documentation

## 2012-10-09 DIAGNOSIS — E785 Hyperlipidemia, unspecified: Secondary | ICD-10-CM | POA: Insufficient documentation

## 2012-10-09 DIAGNOSIS — Z8739 Personal history of other diseases of the musculoskeletal system and connective tissue: Secondary | ICD-10-CM | POA: Insufficient documentation

## 2012-10-09 DIAGNOSIS — IMO0002 Reserved for concepts with insufficient information to code with codable children: Secondary | ICD-10-CM | POA: Insufficient documentation

## 2012-10-09 DIAGNOSIS — E119 Type 2 diabetes mellitus without complications: Secondary | ICD-10-CM | POA: Insufficient documentation

## 2012-10-09 DIAGNOSIS — I10 Essential (primary) hypertension: Secondary | ICD-10-CM | POA: Insufficient documentation

## 2012-10-09 DIAGNOSIS — Z79899 Other long term (current) drug therapy: Secondary | ICD-10-CM | POA: Insufficient documentation

## 2012-10-09 DIAGNOSIS — Z87448 Personal history of other diseases of urinary system: Secondary | ICD-10-CM | POA: Insufficient documentation

## 2012-10-09 DIAGNOSIS — S060X9A Concussion with loss of consciousness of unspecified duration, initial encounter: Secondary | ICD-10-CM | POA: Insufficient documentation

## 2012-10-09 DIAGNOSIS — Y929 Unspecified place or not applicable: Secondary | ICD-10-CM | POA: Insufficient documentation

## 2012-10-09 DIAGNOSIS — W11XXXA Fall on and from ladder, initial encounter: Secondary | ICD-10-CM | POA: Insufficient documentation

## 2012-10-09 DIAGNOSIS — W19XXXA Unspecified fall, initial encounter: Secondary | ICD-10-CM

## 2012-10-09 NOTE — ED Provider Notes (Signed)
History    CSN: [id number] Arrival date & time 10/09/12  1757  First MD Initiated Contact with Patient 10/09/12 1812     Chief Complaint  Patient presents with  . Fall   (Consider location/radiation/quality/duration/timing/severity/associated sxs/prior Treatment) Patient is a 79 y.o. male presenting with fall. The history is provided by the patient.  Fall This is a new problem. Pertinent negatives include no chest pain, no abdominal pain, no headaches and no shortness of breath.   patient was putting a screw and on the ladder and he fell backwards and hit his head. No loss of consciousness. He has mild pain on his back that he states is from the spinal board he was on. He states remembers falling but then was a little hazy. No confusion now. No neck pain no abdominal pain. No chest pain. No numbness or weakness. He is not on anticoagulation Past Medical History  Diagnosis Date  . Type II or unspecified type diabetes mellitus without mention of complication, uncontrolled   . Other and unspecified hyperlipidemia   . Unspecified essential hypertension   . Unspecified disorder of kidney and ureter   . Diastasis of muscle   . Elevated prostate specific antigen (PSA)   . GERD (gastroesophageal reflux disease)    Past Surgical History  Procedure Laterality Date  . Finger surgery  1955    RIGHT INDEX AND RIGHT MIDDLE FINGER  . Sp intro uret cath perc  03-1999  . Coronary angioplasty with stent placement  05-2000   Family History  Problem Relation Age of Onset  . Heart disease Mother   . Diabetes Mother   . Heart disease Father   . Heart attack Father   . Heart disease Sister   . Diabetes Sister   . Heart disease Brother   . Diabetes Brother   . Diabetes Brother   . Heart disease Brother   . Diabetes Brother   . Heart disease Brother    History  Substance Use Topics  . Smoking status: Never Smoker   . Smokeless tobacco: Not on file  . Alcohol Use: No    Review of  Systems  Constitutional: Negative for activity change and appetite change.  HENT: Negative for neck stiffness.   Eyes: Negative for pain.  Respiratory: Negative for chest tightness and shortness of breath.   Cardiovascular: Negative for chest pain and leg swelling.  Gastrointestinal: Negative for nausea, vomiting, abdominal pain and diarrhea.  Genitourinary: Negative for flank pain.  Musculoskeletal: Positive for back pain.  Skin: Negative for rash.  Neurological: Negative for weakness, numbness and headaches.  Psychiatric/Behavioral: Negative for behavioral problems.    Allergies  Review of patient's allergies indicates no known allergies.  Home Medications   Current Outpatient Rx  Name  Route  Sig  Dispense  Refill  . aspirin 81 MG tablet   Oral   Take 81 mg by mouth at bedtime.          . furosemide (LASIX) 40 MG tablet   Oral   Take 40 mg by mouth daily as needed. Take one tablet once daily as needed to control swelling.         Marland Kitchen losartan-hydrochlorothiazide (HYZAAR) 100-25 MG per tablet   Oral   Take 1 tablet by mouth daily.         . metFORMIN (GLUCOPHAGE) 500 MG tablet   Oral   Take 1,000 mg by mouth 2 (two) times daily with a meal.          .  Multiple Vitamin (MULTIVITAMIN) tablet   Oral   Take 1 tablet by mouth daily.           Marland Kitchen omeprazole (PRILOSEC) 40 MG capsule   Oral   Take 40 mg by mouth 2 (two) times daily.          . psyllium (METAMUCIL) 58.6 % powder   Oral   Take by mouth at bedtime. Mix One teaspoon in water         . simvastatin (ZOCOR) 40 MG tablet   Oral   Take 40 mg by mouth at bedtime.           BP 122/65  Pulse 92  Temp(Src) 97.1 F (36.2 C) (Oral)  Resp 14  SpO2 99% Physical Exam  Nursing note and vitals reviewed. Constitutional: He is oriented to person, place, and time. He appears well-developed and well-nourished.  HENT:  Head: Normocephalic and atraumatic.  Eyes: EOM are normal. Pupils are equal, round,  and reactive to light.  Neck:  No midline tenderness. Cervical collar in place.  Cardiovascular: Normal rate, regular rhythm and normal heart sounds.   No murmur heard. Pulmonary/Chest: Effort normal and breath sounds normal.  Abdominal: Soft. Bowel sounds are normal. He exhibits no distension and no mass. There is no tenderness. There is no rebound and no guarding.  Musculoskeletal: Normal range of motion. He exhibits no edema.  Neurological: He is alert and oriented to person, place, and time. No cranial nerve deficit.  Skin: Skin is warm and dry.  Psychiatric: He has a normal mood and affect.    ED Course  Procedures (including critical care time) Labs Reviewed - No data to display Ct Head Wo Contrast  10/09/2012   *RADIOLOGY REPORT*  Clinical Data:  Fall with head and neck injury, headache and neck pain.  CT HEAD WITHOUT CONTRAST CT CERVICAL SPINE WITHOUT CONTRAST  Technique:  Multidetector CT imaging of the head and cervical spine was performed following the standard protocol without intravenous contrast.  Multiplanar CT image reconstructions of the cervical spine were also generated.  Comparison:  None The  CT HEAD  Findings: Atrophy and chronic small vessel white matter ischemic changes are noted. A remote left basil ganglia infarct is present. No acute intracranial abnormalities are identified, including mass lesion or mass effect, hydrocephalus, extra-axial fluid collection, midline shift, hemorrhage, or acute infarction.  The visualized bony calvarium is unremarkable.  IMPRESSION: No evidence of acute intracranial abnormality.  Atrophy, chronic small vessel white matter ischemic changes and remote left basil ganglia infarcts  CT CERVICAL SPINE  Findings: Normal alignment is noted. There is no evidence of acute fracture, subluxation or prevertebral soft tissue swelling. No focal bony lesions are identified. Moderate degenerative disc disease/spondylosis at C5-C6 is noted. Mild degenerative  disc disease at C4-C5 and C6-C7 noted. The soft tissue structures are within normal limits.  IMPRESSION: No static evidence of acute injury to the cervical spine.  Degenerative changes as described.   Original Report Authenticated By: Harmon Pier, M.D.   Ct Cervical Spine Wo Contrast  10/09/2012   *RADIOLOGY REPORT*  Clinical Data:  Fall with head and neck injury, headache and neck pain.  CT HEAD WITHOUT CONTRAST CT CERVICAL SPINE WITHOUT CONTRAST  Technique:  Multidetector CT imaging of the head and cervical spine was performed following the standard protocol without intravenous contrast.  Multiplanar CT image reconstructions of the cervical spine were also generated.  Comparison:  None The  CT HEAD  Findings: Atrophy and  chronic small vessel white matter ischemic changes are noted. A remote left basil ganglia infarct is present. No acute intracranial abnormalities are identified, including mass lesion or mass effect, hydrocephalus, extra-axial fluid collection, midline shift, hemorrhage, or acute infarction.  The visualized bony calvarium is unremarkable.  IMPRESSION: No evidence of acute intracranial abnormality.  Atrophy, chronic small vessel white matter ischemic changes and remote left basil ganglia infarcts  CT CERVICAL SPINE  Findings: Normal alignment is noted. There is no evidence of acute fracture, subluxation or prevertebral soft tissue swelling. No focal bony lesions are identified. Moderate degenerative disc disease/spondylosis at C5-C6 is noted. Mild degenerative disc disease at C4-C5 and C6-C7 noted. The soft tissue structures are within normal limits.  IMPRESSION: No static evidence of acute injury to the cervical spine.  Degenerative changes as described.   Original Report Authenticated By: Harmon Pier, M.D.   1. Fall, initial encounter   2. Concussion, with loss of consciousness of unspecified duration, initial encounter     MDM  Patient with fall. Had loss of consciousness. Negative head  CT and cervical spine CT. Pain with range of motion. No other apparent injury. Patient be discharged home  Juliet Rude. Rubin Payor, MD 10/09/12 1958

## 2012-10-09 NOTE — ED Notes (Signed)
The pt was on a ladder drilling and he lost his balance with the drill and fel from the ladder.  Poss loc confusion initially.  He arrived on a liong spine board  With c-collar from [facility].  Pt alert on arrival  He remembers what occurred with the fall . He had no pain anywhere just from the lsb.  No pain on arrival.  Iv per ems

## 2012-10-09 NOTE — ED Notes (Signed)
Pt taken out in a wheelchair. Safely aided into vehicle.

## 2012-10-20 ENCOUNTER — Other Ambulatory Visit: Payer: Self-pay | Admitting: Internal Medicine

## 2012-10-22 ENCOUNTER — Encounter: Payer: Self-pay | Admitting: Internal Medicine

## 2012-10-22 ENCOUNTER — Ambulatory Visit (INDEPENDENT_AMBULATORY_CARE_PROVIDER_SITE_OTHER): Payer: Federal, State, Local not specified - PPO | Admitting: Internal Medicine

## 2012-10-22 VITALS — BP 130/80 | HR 93 | Temp 97.4°F | Resp 16 | Ht 67.0 in | Wt 190.8 lb

## 2012-10-22 DIAGNOSIS — N289 Disorder of kidney and ureter, unspecified: Secondary | ICD-10-CM

## 2012-10-22 DIAGNOSIS — R413 Other amnesia: Secondary | ICD-10-CM

## 2012-10-22 DIAGNOSIS — R972 Elevated prostate specific antigen [PSA]: Secondary | ICD-10-CM

## 2012-10-22 DIAGNOSIS — E785 Hyperlipidemia, unspecified: Secondary | ICD-10-CM

## 2012-10-22 DIAGNOSIS — I679 Cerebrovascular disease, unspecified: Secondary | ICD-10-CM

## 2012-10-22 DIAGNOSIS — I10 Essential (primary) hypertension: Secondary | ICD-10-CM

## 2012-10-22 DIAGNOSIS — G319 Degenerative disease of nervous system, unspecified: Secondary | ICD-10-CM

## 2012-10-22 DIAGNOSIS — E119 Type 2 diabetes mellitus without complications: Secondary | ICD-10-CM

## 2012-10-22 NOTE — Progress Notes (Signed)
Subjective:    Patient ID: Jimmy Dawson, male    DOB: 08-07-1928, 79 y.o.   MRN: [id number]  HPI  Brain atrophy: noted on CT brain 10/09/12  Cerebrovascular disease: noted on CT brain  Hyperlipidemia: Under excellent control  PSA elevation : Followup with future lab work   Hypertension: Good control  Mild memory disturbance: noted prior to recent fall. Likely vascular in origin  Renal insufficiency asymptomatic. Routine followup with lab work in future visits  Type II or unspecified type diabetes mellitus without mention of complication, not stated as uncontrolled: Well controlled continue to follow with lab work on future visits  Current Outpatient Prescriptions on File Prior to Visit  Medication Sig Dispense Refill  . aspirin 81 MG tablet Take 81 mg by mouth at bedtime.       . furosemide (LASIX) 40 MG tablet Take 40 mg by mouth daily as needed. Take one tablet once daily as needed to control swelling.      Marland Kitchen losartan-hydrochlorothiazide (HYZAAR) 100-25 MG per tablet Take 1 tablet by mouth daily.      . metFORMIN (GLUCOPHAGE) 500 MG tablet Take 1,000 mg by mouth 2 (two) times daily with a meal.       . Multiple Vitamin (MULTIVITAMIN) tablet Take 1 tablet by mouth daily.        Marland Kitchen omeprazole (PRILOSEC) 40 MG capsule Take 40 mg by mouth 2 (two) times daily.       . psyllium (METAMUCIL) 58.6 % powder Take by mouth at bedtime. Mix One teaspoon in water      . simvastatin (ZOCOR) 40 MG tablet Take 40 mg by mouth at bedtime.        No current facility-administered medications on file prior to visit.     Review of Systems  Constitutional: Negative.  Negative for fever, chills, diaphoresis, activity change, appetite change and fatigue.  HENT: Positive for hearing loss. Negative for ear pain, nosebleeds, congestion, rhinorrhea, sinus pressure and tinnitus.        Wears prescription lenses. Otherwise negative in regards to symptoms.  Eyes:       Wears prescription lenses.  Negative for all other symptoms.  Respiratory: Negative for cough, choking, chest tightness, shortness of breath and wheezing.   Cardiovascular: Negative for chest pain, palpitations and leg swelling.  Gastrointestinal: Negative for abdominal pain, diarrhea, constipation and blood in stool.  Endocrine: Negative for cold intolerance, heat intolerance, polydipsia, polyphagia and polyuria.  Genitourinary: Negative for dysuria, urgency, frequency, flank pain, decreased urine volume, enuresis and difficulty urinating.  Musculoskeletal: Negative for myalgias, back pain, arthralgias and gait problem.  Skin: Negative.   Allergic/Immunologic: Negative.   Neurological: Negative for dizziness, tremors, speech difficulty, weakness, light-headedness, numbness and headaches.       Patient says he is aware that his memory is slipping.  Hematological: Negative.   Psychiatric/Behavioral: Negative.        Objective:BP 130/80  Pulse 93  Temp(Src) 97.4 F (36.3 C) (Oral)  Resp 16  Ht 5\' 7"  (1.702 m)  Wt 190 lb 12.8 oz (86.546 kg)  BMI 29.88 kg/m2  SpO2 95%    Physical Exam  Constitutional: He is oriented to person, place, and time. He appears well-developed and well-nourished.  HENT:  Significant hearing loss bilaterally. Interferes with conversation.  Eyes:  Wears prescription lenses, otherwise normal.  Neck: Neck supple. No JVD present. No tracheal deviation present. No thyromegaly present.  Cardiovascular: Normal rate, regular rhythm, normal heart sounds and intact distal  pulses.  Exam reveals no gallop.   No murmur heard. Pulmonary/Chest: Breath sounds normal. No respiratory distress. He has no wheezes. He has no rales. He exhibits no tenderness.  Abdominal: Soft. Bowel sounds are normal. He exhibits no mass. There is no tenderness.  Musculoskeletal: Normal range of motion.  Lymphadenopathy:    He has no cervical adenopathy.  Neurological: He is alert and oriented to person, place, and time.  No cranial nerve deficit. Coordination normal.  Wife has reported issues with his memory in the past. He now says that he is aware of some memory slippage. He does not feel that it incapacitates him in any way.  Skin: Skin is warm and dry. No rash noted. No erythema.  Psychiatric: He has a normal mood and affect. His behavior is normal. Judgment and thought content normal.     7/10/14CT CERVICAL SPINE  Findings: Normal alignment is noted. There is no evidence of acute fracture, subluxation or prevertebral soft tissue swelling. No focal bony lesions are identified. Moderate degenerative disc disease/spondylosis at C5-C6 is noted. Mild degenerative disc disease at C4-C5 and C6-C7 noted. The soft tissue structures are within normal limits.  IMPRESSION: No static evidence of acute injury to the cervical spine.  Degenerative changes as described  7/10/14CT HEAD  Findings: Atrophy and chronic small vessel white matter ischemic changes are noted. A remote left basil ganglia infarct is present. No acute intracranial abnormalities are identified, including mass lesion or mass effect, hydrocephalus, extra-axial fluid collection, midline shift, hemorrhage, or acute infarction.  The visualized bony calvarium is unremarkable.  IMPRESSION: No evidence of acute intracranial abnormality.  Atrophy, chronic small vessel white matter ischemic changes and remote left basil ganglia infarcts.  06/22/11 CBC; WBC 6.6, RBC 4.21, HGB 13.1 CMP; Glucose 88, BUN 18, Creatinine 0.75 Lipid Panel; Cholesterol 134, Triglycerides 50, HDL 43, LDL 81 10/19/11 BMP; Glucose 105, BUN 28, Creatinine 1.21 02/20/2012 BMP Glucose 118 Bun 16 Creatinine 1.02  HA1C 7.0  Lipid Panel Cholesterol 135 Triglycerides 64 Hdl 52 Ldl 70  A1c 7.6   7/21/14BMP: glu 114, BUN 49, creat2.62  Lipids; TC 142, trig 97, HDL 56, LDL 67  A1c 7.4    Assessment & Plan:  Brain atrophy: Chronic and possibly related to his memory  slippage  Cerebrovascular disease: Likely cause of some memory loss  Hyperlipidemia: Controlled   - Plan: Lipid panel  PSA elevation: Routine followup the future lab   - Plan: PSA  HTN (hypertension): Controlled  Mild memory disturbance: Mild memory loss. Continue to follow with periodic MMSE  Renal insufficiency: Routine followup lab work prior to next visit   - Plan: CMP  Type II or unspecified type diabetes mellitus without mention of complication, not stated as uncontrolled: Well controlled   - Plan: Hemoglobin A1c, CMP

## 2012-10-22 NOTE — Patient Instructions (Signed)
Continue current medications. 

## 2012-10-28 LAB — BASIC METABOLIC PANEL
BUN/Creatinine Ratio: 19 (ref 10–22)
BUN: 49 mg/dL — ABNORMAL HIGH (ref 8–27)
CO2: 25 mmol/L (ref 18–29)
Calcium: 10.6 mg/dL — ABNORMAL HIGH (ref 8.6–10.2)
Chloride: 97 mmol/L (ref 97–108)
Glucose: 114 mg/dL — ABNORMAL HIGH (ref 65–99)

## 2012-10-28 LAB — HGB A1C W/O EAG: Hgb A1c MFr Bld: 7.4 % — ABNORMAL HIGH (ref 4.8–5.6)

## 2012-10-28 LAB — LIPID PANEL W/O CHOL/HDL RATIO: Triglycerides: 97 mg/dL (ref 0–149)

## 2013-01-16 ENCOUNTER — Other Ambulatory Visit: Payer: Federal, State, Local not specified - PPO

## 2013-01-16 DIAGNOSIS — E785 Hyperlipidemia, unspecified: Secondary | ICD-10-CM

## 2013-01-16 DIAGNOSIS — R972 Elevated prostate specific antigen [PSA]: Secondary | ICD-10-CM

## 2013-01-16 DIAGNOSIS — E119 Type 2 diabetes mellitus without complications: Secondary | ICD-10-CM

## 2013-01-16 DIAGNOSIS — N289 Disorder of kidney and ureter, unspecified: Secondary | ICD-10-CM

## 2013-01-17 LAB — COMPREHENSIVE METABOLIC PANEL
Albumin: 4.7 g/dL (ref 3.5–4.7)
BUN: 22 mg/dL (ref 8–27)
CO2: 28 mmol/L (ref 18–29)
Chloride: 93 mmol/L — ABNORMAL LOW (ref 97–108)
Creatinine, Ser: 1.26 mg/dL (ref 0.76–1.27)
GFR calc Af Amer: 61 mL/min/{1.73_m2} (ref >59)
Glucose: 118 mg/dL — ABNORMAL HIGH (ref 65–99)
Total Bilirubin: 0.7 mg/dL (ref 0.0–1.2)
Total Protein: 7 g/dL (ref 6.0–8.5)

## 2013-01-17 LAB — LIPID PANEL
Cholesterol, Total: 144 mg/dL (ref 100–199)
HDL: 44 mg/dL (ref >39)
VLDL Cholesterol Cal: 18 mg/dL (ref 5–40)

## 2013-01-17 LAB — PSA: PSA: 2.4 ng/mL (ref 0.0–4.0)

## 2013-01-20 ENCOUNTER — Ambulatory Visit (INDEPENDENT_AMBULATORY_CARE_PROVIDER_SITE_OTHER): Payer: Federal, State, Local not specified - PPO | Admitting: Internal Medicine

## 2013-01-20 ENCOUNTER — Encounter: Payer: Self-pay | Admitting: Internal Medicine

## 2013-01-20 VITALS — BP 136/78 | HR 103 | Temp 98.4°F | Ht 66.5 in | Wt 187.2 lb

## 2013-01-20 DIAGNOSIS — I2510 Atherosclerotic heart disease of native coronary artery without angina pectoris: Secondary | ICD-10-CM

## 2013-01-20 DIAGNOSIS — IMO0001 Reserved for inherently not codable concepts without codable children: Secondary | ICD-10-CM

## 2013-01-20 DIAGNOSIS — N289 Disorder of kidney and ureter, unspecified: Secondary | ICD-10-CM

## 2013-01-20 DIAGNOSIS — E1165 Type 2 diabetes mellitus with hyperglycemia: Secondary | ICD-10-CM

## 2013-01-20 DIAGNOSIS — R413 Other amnesia: Secondary | ICD-10-CM

## 2013-01-20 DIAGNOSIS — I10 Essential (primary) hypertension: Secondary | ICD-10-CM

## 2013-01-20 DIAGNOSIS — E785 Hyperlipidemia, unspecified: Secondary | ICD-10-CM

## 2013-01-20 DIAGNOSIS — G4730 Sleep apnea, unspecified: Secondary | ICD-10-CM

## 2013-01-20 NOTE — Patient Instructions (Signed)
Continue current medications. 

## 2013-01-20 NOTE — Progress Notes (Signed)
Subjective:    Patient ID: Jimmy Dawson, male    DOB: [birth year], 79 y.o.   MRN: [id number]  Chief Complaint  Patient presents with  . Medical Managment of Chronic Issues    3 month f/u with labs printed    HPI DM (diabetes mellitus), type 2, uncontrolled: improved  Hyperlipidemia: stable  HTN (hypertension): controlled  Unspecified sleep apnea: no longer snores. Not using CPAP.  Renal insufficiency: improved  Mild memory disturbance: unchanged  Coronary atherosclerosis of unspecified type of vessel, native or graft: asymptomatic  Stressed by water leak and repairs at his home.  Has some diffuse itching that sometimes irritates him. Does not interfere with sleep. No associated rash.  Current Outpatient Prescriptions on File Prior to Visit  Medication Sig Dispense Refill  . aspirin 81 MG tablet Take 81 mg by mouth at bedtime.       . furosemide (LASIX) 40 MG tablet Take 40 mg by mouth daily as needed. Take one tablet once daily as needed to control swelling.      Marland Kitchen losartan-hydrochlorothiazide (HYZAAR) 100-25 MG per tablet Take 1 tablet by mouth daily.      . metFORMIN (GLUCOPHAGE) 500 MG tablet Take 1,000 mg by mouth 2 (two) times daily with a meal.       . Multiple Vitamin (MULTIVITAMIN) tablet Take 1 tablet by mouth daily.        Marland Kitchen omeprazole (PRILOSEC) 40 MG capsule Take 40 mg by mouth 2 (two) times daily.       . psyllium (METAMUCIL) 58.6 % powder Take by mouth at bedtime. Mix One teaspoon in water      . simvastatin (ZOCOR) 40 MG tablet Take 40 mg by mouth at bedtime.        No current facility-administered medications on file prior to visit.     Review of Systems  Constitutional: Negative.  Negative for fever, chills, diaphoresis, activity change, appetite change and fatigue.  HENT: Positive for hearing loss. Negative for congestion, ear pain, nosebleeds, rhinorrhea, sinus pressure and tinnitus.        Wears prescription lenses. Otherwise negative in  regards to symptoms.  Eyes:       Wears prescription lenses. Negative for all other symptoms.  Respiratory: Negative for cough, choking, chest tightness, shortness of breath and wheezing.   Cardiovascular: Negative for chest pain, palpitations and leg swelling.  Gastrointestinal: Negative for abdominal pain, diarrhea, constipation and blood in stool.  Endocrine: Negative for cold intolerance, heat intolerance, polydipsia, polyphagia and polyuria.  Genitourinary: Negative for dysuria, urgency, frequency, flank pain, decreased urine volume, enuresis and difficulty urinating.  Musculoskeletal: Negative for arthralgias, back pain, gait problem and myalgias.  Skin:       Has occasional, intermittent itching.  Allergic/Immunologic: Negative.   Neurological: Negative for dizziness, tremors, speech difficulty, weakness, light-headedness, numbness and headaches.       Patient says he is aware that his memory is slipping.  Hematological: Negative.   Psychiatric/Behavioral: Negative.        Objective:BP 136/78  Pulse 103  Temp(Src) 98.4 F (36.9 C) (Oral)  Ht 5' 6.5" (1.689 m)  Wt 187 lb 3.2 oz (84.913 kg)  BMI 29.77 kg/m2  SpO2 94%    Physical Exam  Constitutional: He is oriented to person, place, and time. He appears well-developed and well-nourished.  HENT:  Significant hearing loss bilaterally. Interferes with conversation.  Eyes:  Wears prescription lenses, otherwise normal.  Neck: Neck supple. No JVD present. No tracheal deviation  present. No thyromegaly present.  Cardiovascular: Normal rate, regular rhythm, normal heart sounds and intact distal pulses.  Exam reveals no gallop.   No murmur heard. Pulmonary/Chest: Breath sounds normal. No respiratory distress. He has no wheezes. He has no rales. He exhibits no tenderness.  Abdominal: Soft. Bowel sounds are normal. He exhibits no mass. There is no tenderness.  Musculoskeletal: Normal range of motion.  Lymphadenopathy:    He has no  cervical adenopathy.  Neurological: He is alert and oriented to person, place, and time. No cranial nerve deficit. Coordination normal.  Wife has reported issues with his memory in the past. He now says that he is aware of some memory slippage. He does not feel that it incapacitates him in any way.  Skin: Skin is warm and dry. No rash noted. No erythema.  Psychiatric: He has a normal mood and affect. His behavior is normal. Judgment and thought content normal.          Assessment & Plan:  1. Hyperlipidemia Controlled - Lipid panel; Future  2. DM (diabetes mellitus), type 2, uncontrolled Good control - Hemoglobin A1c; Future - Comprehensive metabolic panel; Future  3. HTN (hypertension) Controlled  4 Sleep apnea Unchanged  5. Renal insufficiency Unchanged - Comprehensive metabolic panel; Future  6. Mild memory disturbance Unchanged  7. Coronary atherosclerosis of unspecified type of vessel, native or graft No angina

## 2013-04-14 ENCOUNTER — Encounter: Payer: Self-pay | Admitting: Cardiology

## 2013-04-14 DIAGNOSIS — I2510 Atherosclerotic heart disease of native coronary artery without angina pectoris: Secondary | ICD-10-CM

## 2013-04-14 DIAGNOSIS — I359 Nonrheumatic aortic valve disorder, unspecified: Secondary | ICD-10-CM

## 2013-04-14 DIAGNOSIS — I119 Hypertensive heart disease without heart failure: Secondary | ICD-10-CM | POA: Insufficient documentation

## 2013-04-14 NOTE — Progress Notes (Unsigned)
Patient ID: Jimmy Dawson, male   DOB: 07/12/1928, 80 y.o.   MRN: [id number]   Jimmy Dawson, Jimmy Dawson    Date of visit:  04/14/2013 DOB:  07-19-1928    Age:  80 yrs. Medical record number:  [id number]     Account number:  [id number] Primary Care Provider: GREEN III, ARTHUR G ____________________________ CURRENT DIAGNOSES  1. CAD,Native  2. Aortic Valve Disorder  3. Hyperlipidemia  4. Hypertensive Heart Disease-Benign without CHF  5. Conduction Disorder-Left Bundle Branch Block  6. Diabetes Mellitus-NIDD  7. Stent Placement  8. GERD ____________________________ ALLERGIES  No Known Allergies ____________________________ MEDICATIONS  1. omeprazole 40 mg Capsule, Delayed Release(E.C.), 1 p.o. daily  2. multivitamin Tablet, 1 p.o. daily  3. simvastatin 40 mg tablet, 1 p.o. daily  4. Metamucil 3.4 gram/12 gram Powder, qd  5. furosemide 40 mg tablet, PRN  6. losartan-hydrochlorothiazide 100-25 mg tablet, 1 p.o. daily  7. aspirin 81 mg chewable tablet, 1 p.o. daily  8. metformin 500 mg tablet, 2 p.o. b.i.d. ____________________________ CHIEF COMPLAINTS  Followup of CAD,Native ____________________________ HISTORY OF PRESENT ILLNESS Patient seen for cardiac followup. He has been doing well since he was previously here. He is exercising on a regular basis and his lipids were reviewed today and show excellent control. He denies angina and has no PND, orthopnea, syncope, palpitations, or claudication. 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 ____________________________ PAST HISTORY  Past Medical Illnesses:  hypertension, DM-non-insulin dependent, hyperlipidemia, obesity, GERD, BPH, TIA;  Cardiovascular Illnesses:  CAD, aortic sclerosis;  Surgical Procedures:  TURP;  NYHA Classification:  II;  Cardiology Procedures-Invasive:  cardiac cath (left) February 2002, Penta stent February 2002 Dr. Glade Lloyd;  Cardiology Procedures-Noninvasive:  treadmill cardiolite December 2009, echocardiogram July 2011,  echocardiogram July 2013;  Cardiac Cath Results:  normal Left main, occluded mid LAD, 95% stenosis proximal Diag 1, scattered irregularities CFX, scattered irregularities RCA, stent placed mid LAD;  LVEF of 45% documented via echocardiogram on 10/17/2011,   ____________________________ CARDIO-PULMONARY TEST DATES EKG Date:  04/14/2013;   Cardiac Cath Date:  05/08/2000;  Stent Placement Date: 05/08/2000;  Nuclear Study Date:  03/03/2008;  Echocardiography Date: 10/17/2011;  Chest Xray Date: 06/27/2007;   ____________________________ FAMILY HISTORY Brother -- Brother dead, Myocardial infarction Brother -- Brother dead, Unknown disease Brother -- Brother dead, Unknown disease Father -- Father dead, Myocardial infarction Mother -- Mother dead, Diabetes mellitus, Coronary Artery Disease ____________________________ SOCIAL HISTORY Alcohol Use:  does not use alcohol;  Smoking:  nonsmoker;  Diet:  regular diet;  Lifestyle:  widower and remarried 12 years;  Exercise:  no regular exercise;  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 Eyes: decreased acuity O.D. Respiratory: denies dyspnea, cough, wheezing or hemoptysis. Cardiovascular:  please review HPI Abdominal: denies dyspepsia, GI bleeding, constipation, or diarrhea Genitourinary-Male: no dysuria, urgency, frequency, or nocturia  Musculoskeletal:  chronic low back pain Neurological:  denies headaches, stroke, or TIA  ____________________________ PHYSICAL EXAMINATION VITAL SIGNS  Blood Pressure:  132/80 Sitting, Right arm, regular cuff  , 130/84 Standing, Right arm and regular cuff   Pulse:  100/min. Weight:  194.00 lbs. Height:  69"BMI: 28  Constitutional:  pleasant white male in no acute distress, mildly obese Head:  normocephalic, normal hair pattern, no masses or tenderness Eyes:  strabismus ENT:  ears, nose and throat  reveal no gross abnormalities.  Dentition good. Neck:  supple, without massess. No JVD,  thyromegaly or carotid bruits. Carotid upstroke normal. Chest:  normal symmetry, clear to auscultation and percussion. Cardiac:  regular rhythm, normal S1 and S2, no murmur, grade 1/6 systolic murmur at aortic area radiating to neck Peripheral Pulses:  femoral pulses 2+, posterior tibial pulses 3+ Extremities & Back:  mild bilateral venous insufficiency changes present, no edema present Neurological:  no gross motor or sensory deficits noted, affect appropriate, oriented x3. ____________________________ MOST RECENT LIPID PANEL 01/16/13  CHOL TOTL 144 mg/dl, LDL 82 NM, HDL 44 mg/dl, TRIGLYCER 89 mg/dl and CHOL/HDL 3.3 (Calc) ____________________________ IMPRESSIONS/PLAN 1. Coronary artery disease with previous stenting with no angina 2. Hypertension controlled 3. Left bundle branch block 4. Hyperlipidemia 5. Very mild aortic valve disease  Recommendations:  He continues to do clinically well and appears much younger than his stated age. I will see him in followup in one year and he is to call if there are problems. EKG shows sinus with a LBBB pattern. ____________________________ TODAYS ORDERS  1. 12 Lead EKG: Today  2. Return Visit: 1 year  3. 12 Lead EKG: 1 year                       ____________________________ Cardiology Physician:  Kerry Hough MD [facility]

## 2013-05-11 ENCOUNTER — Other Ambulatory Visit: Payer: Self-pay | Admitting: Internal Medicine

## 2013-05-22 ENCOUNTER — Other Ambulatory Visit: Payer: Federal, State, Local not specified - PPO

## 2013-05-22 DIAGNOSIS — N289 Disorder of kidney and ureter, unspecified: Secondary | ICD-10-CM

## 2013-05-22 DIAGNOSIS — E785 Hyperlipidemia, unspecified: Secondary | ICD-10-CM

## 2013-05-22 DIAGNOSIS — IMO0002 Reserved for concepts with insufficient information to code with codable children: Secondary | ICD-10-CM

## 2013-05-22 DIAGNOSIS — E1165 Type 2 diabetes mellitus with hyperglycemia: Secondary | ICD-10-CM

## 2013-05-23 LAB — LIPID PANEL
CHOL/HDL RATIO: 3.3 ratio (ref 0.0–5.0)
Cholesterol, Total: 140 mg/dL (ref 100–199)
HDL: 42 mg/dL (ref >39)
LDL Calculated: 78 mg/dL (ref 0–99)
Triglycerides: 99 mg/dL (ref 0–149)
VLDL Cholesterol Cal: 20 mg/dL (ref 5–40)

## 2013-05-23 LAB — COMPREHENSIVE METABOLIC PANEL
A/G RATIO: 1.8 (ref 1.1–2.5)
ALK PHOS: 69 IU/L (ref 39–117)
ALT: 15 IU/L (ref 0–44)
AST: 20 IU/L (ref 0–40)
Albumin: 4.6 g/dL (ref 3.5–4.7)
BILIRUBIN TOTAL: 0.7 mg/dL (ref 0.0–1.2)
BUN / CREAT RATIO: 18 (ref 10–22)
BUN: 25 mg/dL (ref 8–27)
CO2: 26 mmol/L (ref 18–29)
CREATININE: 1.37 mg/dL — AB (ref 0.76–1.27)
Calcium: 9.8 mg/dL (ref 8.6–10.2)
Chloride: 96 mmol/L — ABNORMAL LOW (ref 97–108)
GFR calc Af Amer: 55 mL/min/{1.73_m2} — ABNORMAL LOW (ref >59)
GFR, EST NON AFRICAN AMERICAN: 48 mL/min/{1.73_m2} — AB (ref >59)
GLOBULIN, TOTAL: 2.5 g/dL (ref 1.5–4.5)
Glucose: 128 mg/dL — ABNORMAL HIGH (ref 65–99)
Potassium: 4 mmol/L (ref 3.5–5.2)
Sodium: 141 mmol/L (ref 134–144)
Total Protein: 7.1 g/dL (ref 6.0–8.5)

## 2013-05-23 LAB — HEMOGLOBIN A1C
Est. average glucose Bld gHb Est-mCnc: 166 mg/dL
Hgb A1c MFr Bld: 7.4 % — ABNORMAL HIGH (ref 4.8–5.6)

## 2013-05-26 ENCOUNTER — Ambulatory Visit: Payer: Federal, State, Local not specified - PPO | Admitting: Internal Medicine

## 2013-06-02 ENCOUNTER — Ambulatory Visit: Payer: Federal, State, Local not specified - PPO | Admitting: Internal Medicine

## 2013-06-19 ENCOUNTER — Other Ambulatory Visit: Payer: Self-pay | Admitting: *Deleted

## 2013-06-19 MED ORDER — METFORMIN HCL 500 MG PO TABS: 1000.0000 mg | ORAL_TABLET | Freq: Two times a day (BID) | ORAL | Status: DC

## 2013-06-19 MED ORDER — SIMVASTATIN 40 MG PO TABS: ORAL_TABLET | ORAL | Status: DC

## 2013-06-19 MED ORDER — LOSARTAN POTASSIUM-HCTZ 100-25 MG PO TABS: ORAL_TABLET | ORAL | Status: DC

## 2013-06-19 MED ORDER — OMEPRAZOLE 40 MG PO CPDR: DELAYED_RELEASE_CAPSULE | ORAL | Status: DC

## 2013-06-19 NOTE — Telephone Encounter (Signed)
Patient Requested 

## 2013-06-23 ENCOUNTER — Ambulatory Visit (INDEPENDENT_AMBULATORY_CARE_PROVIDER_SITE_OTHER): Payer: Federal, State, Local not specified - PPO | Admitting: Internal Medicine

## 2013-06-23 ENCOUNTER — Encounter: Payer: Self-pay | Admitting: Internal Medicine

## 2013-06-23 VITALS — BP 122/70 | HR 90 | Ht 66.5 in | Wt 192.0 lb

## 2013-06-23 DIAGNOSIS — E785 Hyperlipidemia, unspecified: Secondary | ICD-10-CM

## 2013-06-23 DIAGNOSIS — E119 Type 2 diabetes mellitus without complications: Secondary | ICD-10-CM

## 2013-06-23 DIAGNOSIS — H6120 Impacted cerumen, unspecified ear: Secondary | ICD-10-CM

## 2013-06-23 DIAGNOSIS — N289 Disorder of kidney and ureter, unspecified: Secondary | ICD-10-CM

## 2013-06-23 DIAGNOSIS — I359 Nonrheumatic aortic valve disorder, unspecified: Secondary | ICD-10-CM

## 2013-06-23 DIAGNOSIS — I2510 Atherosclerotic heart disease of native coronary artery without angina pectoris: Secondary | ICD-10-CM

## 2013-06-23 NOTE — Progress Notes (Signed)
Patient ID: Jimmy Dawson, male   DOB: 03-08-1929, 80 y.o.   MRN: [id number]    Location:    [facility]  Place of Service:  OFFICE   No Known Allergies  Chief Complaint  Patient presents with  . Medical Managment of Chronic Issues    4 month follow-up and discuss labs (copy printed)     HPI:  Aortic valve disease: unchanged  Hyperlipidemia: controlled  Type II or unspecified type diabetes mellitus without mention of complication: controlled  CAD (coronary artery disease): no angina  Renal insufficiency: unchanged  Impacted cerumen: hearing loss    Medications: Patient's Medications  New Prescriptions   No medications on file  Previous Medications   ASPIRIN 81 MG TABLET    Take 81 mg by mouth at bedtime.    FLUZONE HIGH-DOSE INJECTION       FUROSEMIDE (LASIX) 40 MG TABLET    Take 40 mg by mouth daily as needed. Take one tablet once daily as needed to control swelling.   LOSARTAN-HYDROCHLOROTHIAZIDE (HYZAAR) 100-25 MG PER TABLET    Take one tablet by mouth once daily to control blood pressure   METFORMIN (GLUCOPHAGE) 500 MG TABLET    Take 2 tablets (1,000 mg total) by mouth 2 (two) times daily with a meal.   MULTIPLE VITAMIN (MULTIVITAMIN) TABLET    Take 1 tablet by mouth daily.     OMEPRAZOLE (PRILOSEC) 40 MG CAPSULE    Take one capsule twice daily for acid reduction   PSYLLIUM (METAMUCIL) 58.6 % POWDER    Take by mouth at bedtime. Mix One teaspoon in water   SIMVASTATIN (ZOCOR) 40 MG TABLET    Take one tablet by mouth once daily for cholesterol  Modified Medications   No medications on file  Discontinued Medications   No medications on file     Review of Systems  Constitutional: Negative.  Negative for fever, chills, diaphoresis, activity change, appetite change and fatigue.  HENT: Positive for hearing loss. Negative for congestion, ear pain, nosebleeds, rhinorrhea, sinus pressure and tinnitus.        Wears prescription lenses. Otherwise negative in regards to  symptoms. Wax in both EAC and impacted in right ear.  Eyes:       Wears prescription lenses. Negative for all other symptoms.  Respiratory: Negative for cough, choking, chest tightness, shortness of breath and wheezing.   Cardiovascular: Negative for chest pain, palpitations and leg swelling.  Gastrointestinal: Negative for abdominal pain, diarrhea, constipation and blood in stool.  Endocrine: Negative for cold intolerance, heat intolerance, polydipsia, polyphagia and polyuria.  Genitourinary: Negative for dysuria, urgency, frequency, flank pain, decreased urine volume, enuresis and difficulty urinating.  Musculoskeletal: Negative for arthralgias, back pain, gait problem and myalgias.  Skin:       Has occasional, intermittent itching.  Allergic/Immunologic: Negative.   Neurological: Negative for dizziness, tremors, speech difficulty, weakness, light-headedness, numbness and headaches.       Patient says he is aware that his memory is slipping.  Hematological: Negative.   Psychiatric/Behavioral: Negative.     Filed Vitals:   06/23/13 1505  BP: 122/70  Pulse: 90  Height: 5' 6.5" (1.689 m)  Weight: 192 lb (87.091 kg)  SpO2: 94%   Physical Exam  Constitutional: He is oriented to person, place, and time. He appears well-developed and well-nourished.  HENT:  Significant hearing loss bilaterally. Interferes with conversation. Wax occlusion of right EAC.  Eyes:  Wears prescription lenses, otherwise normal.  Neck: Neck supple. No JVD  present. No tracheal deviation present. No thyromegaly present.  Cardiovascular: Normal rate, regular rhythm, normal heart sounds and intact distal pulses.  Exam reveals no gallop.   No murmur heard. Pulmonary/Chest: Breath sounds normal. No respiratory distress. He has no wheezes. He has no rales. He exhibits no tenderness.  Abdominal: Soft. Bowel sounds are normal. He exhibits no mass. There is no tenderness.  Musculoskeletal: Normal range of motion.    Chronnic back discomfort with movement.  Lymphadenopathy:    He has no cervical adenopathy.  Neurological: He is alert and oriented to person, place, and time. No cranial nerve deficit. Coordination normal.  Wife has reported issues with his memory in the past. He now says that he is aware of some memory slippage. He does not feel that it incapacitates him in any way.  Skin: Skin is warm and dry. No rash noted. No erythema.  Psychiatric: He has a normal mood and affect. His behavior is normal. Judgment and thought content normal.     Labs reviewed: Appointment on 05/22/2013  Component Date Value Ref Range Status  . Hemoglobin A1C 05/22/2013 7.4* 4.8 - 5.6 % Final   Comment:          Increased risk for diabetes: 5.7 - 6.4                                   Diabetes: >6.4                                   Glycemic control for adults with diabetes: <7.0  . Estimated average glucose 05/22/2013 166   Final  . Glucose 05/22/2013 128* 65 - 99 mg/dL Final  . BUN 05/22/2013 25  8 - 27 mg/dL Final  . Creatinine, Ser 05/22/2013 1.37* 0.76 - 1.27 mg/dL Final  . GFR calc non Af Amer 05/22/2013 48* >59 mL/min/1.73 Final  . GFR calc Af Amer 05/22/2013 55* >59 mL/min/1.73 Final  . BUN/Creatinine Ratio 05/22/2013 18  10 - 22 Final  . Sodium 05/22/2013 141  134 - 144 mmol/L Final  . Potassium 05/22/2013 4.0  3.5 - 5.2 mmol/L Final  . Chloride 05/22/2013 96* 97 - 108 mmol/L Final  . CO2 05/22/2013 26  18 - 29 mmol/L Final  . Calcium 05/22/2013 9.8  8.6 - 10.2 mg/dL Final  . Total Protein 05/22/2013 7.1  6.0 - 8.5 g/dL Final  . Albumin 05/22/2013 4.6  3.5 - 4.7 g/dL Final  . Globulin, Total 05/22/2013 2.5  1.5 - 4.5 g/dL Final  . Albumin/Globulin Ratio 05/22/2013 1.8  1.1 - 2.5 Final  . Total Bilirubin 05/22/2013 0.7  0.0 - 1.2 mg/dL Final  . Alkaline Phosphatase 05/22/2013 69  39 - 117 IU/L Final  . AST 05/22/2013 20  0 - 40 IU/L Final  . ALT 05/22/2013 15  0 - 44 IU/L Final  . Cholesterol, Total  05/22/2013 140  100 - 199 mg/dL Final  . Triglycerides 05/22/2013 99  0 - 149 mg/dL Final  . HDL 05/22/2013 42  >39 mg/dL Final   Comment: According to ATP-III Guidelines, HDL-C >59 mg/dL is considered a                          negative risk factor for CHD.  Marland Kitchen VLDL Cholesterol Cal 05/22/2013 20  5 -  40 mg/dL Final  . LDL Calculated 05/22/2013 78  0 - 99 mg/dL Final  . Chol/HDL Ratio 05/22/2013 3.3  0.0 - 5.0 ratio units Final   Comment:                                   T. Chol/HDL Ratio                                                                      Men  Women                                                        1/2 Avg.Risk  3.4    3.3                                                            Avg.Risk  5.0    4.4                                                         2X Avg.Risk  9.6    7.1                                                         3X Avg.Risk 23.4   11.0      Assessment/Plan  1. Aortic valve disease unchanged  2. Hyperlipidemia - Lipid panel; Future  3. Type II or unspecified type diabetes mellitus without mention of complication, not stated as uncontrolled - Hemoglobin A1c; Future - Microalbumin, urine  4. CAD (coronary artery disease) stable  5. Renal insufficiency Continue to monitor - Basic metabolic panel; Future  6. Impacted cerumen Lavaged in office today

## 2013-06-23 NOTE — Patient Instructions (Signed)
Continue medications as listed 

## 2013-06-29 ENCOUNTER — Other Ambulatory Visit: Payer: Self-pay | Admitting: *Deleted

## 2013-06-29 MED ORDER — SIMVASTATIN 40 MG PO TABS: ORAL_TABLET | ORAL | Status: DC

## 2013-06-29 NOTE — Telephone Encounter (Signed)
Patient requested 

## 2013-07-01 ENCOUNTER — Other Ambulatory Visit: Payer: Self-pay | Admitting: *Deleted

## 2013-07-01 DIAGNOSIS — E1165 Type 2 diabetes mellitus with hyperglycemia: Principal | ICD-10-CM

## 2013-07-01 DIAGNOSIS — IMO0001 Reserved for inherently not codable concepts without codable children: Secondary | ICD-10-CM

## 2013-07-09 ENCOUNTER — Encounter: Payer: Self-pay | Admitting: Internal Medicine

## 2013-09-30 DIAGNOSIS — J189 Pneumonia, unspecified organism: Secondary | ICD-10-CM

## 2013-09-30 HISTORY — DX: Pneumonia, unspecified organism: J18.9

## 2013-10-03 ENCOUNTER — Inpatient Hospital Stay (HOSPITAL_COMMUNITY)
Admission: EM | Admit: 2013-10-03 | Discharge: 2013-10-05 | DRG: 189 | Disposition: A | Discharge status: Home / Self Care | Payer: Medicare Other | Attending: Internal Medicine | Admitting: Internal Medicine

## 2013-10-03 ENCOUNTER — Encounter (HOSPITAL_COMMUNITY): Payer: Self-pay | Admitting: Emergency Medicine

## 2013-10-03 ENCOUNTER — Emergency Department (HOSPITAL_COMMUNITY): Payer: Medicare Other

## 2013-10-03 DIAGNOSIS — J9601 Acute respiratory failure with hypoxia: Secondary | ICD-10-CM

## 2013-10-03 DIAGNOSIS — I119 Hypertensive heart disease without heart failure: Secondary | ICD-10-CM | POA: Diagnosis present

## 2013-10-03 DIAGNOSIS — Z833 Family history of diabetes mellitus: Secondary | ICD-10-CM

## 2013-10-03 DIAGNOSIS — I110 Hypertensive heart disease with heart failure: Secondary | ICD-10-CM | POA: Diagnosis present

## 2013-10-03 DIAGNOSIS — I509 Heart failure, unspecified: Secondary | ICD-10-CM | POA: Diagnosis present

## 2013-10-03 DIAGNOSIS — E119 Type 2 diabetes mellitus without complications: Secondary | ICD-10-CM

## 2013-10-03 DIAGNOSIS — I2510 Atherosclerotic heart disease of native coronary artery without angina pectoris: Secondary | ICD-10-CM | POA: Diagnosis present

## 2013-10-03 DIAGNOSIS — Z7982 Long term (current) use of aspirin: Secondary | ICD-10-CM

## 2013-10-03 DIAGNOSIS — K219 Gastro-esophageal reflux disease without esophagitis: Secondary | ICD-10-CM | POA: Diagnosis present

## 2013-10-03 DIAGNOSIS — E1165 Type 2 diabetes mellitus with hyperglycemia: Secondary | ICD-10-CM

## 2013-10-03 DIAGNOSIS — Z8249 Family history of ischemic heart disease and other diseases of the circulatory system: Secondary | ICD-10-CM

## 2013-10-03 DIAGNOSIS — J9600 Acute respiratory failure, unspecified whether with hypoxia or hypercapnia: Principal | ICD-10-CM | POA: Diagnosis present

## 2013-10-03 DIAGNOSIS — I5031 Acute diastolic (congestive) heart failure: Secondary | ICD-10-CM | POA: Diagnosis present

## 2013-10-03 DIAGNOSIS — Z9861 Coronary angioplasty status: Secondary | ICD-10-CM

## 2013-10-03 DIAGNOSIS — Z79899 Other long term (current) drug therapy: Secondary | ICD-10-CM

## 2013-10-03 DIAGNOSIS — Z87891 Personal history of nicotine dependence: Secondary | ICD-10-CM

## 2013-10-03 DIAGNOSIS — I5033 Acute on chronic diastolic (congestive) heart failure: Secondary | ICD-10-CM | POA: Diagnosis present

## 2013-10-03 DIAGNOSIS — T502X5A Adverse effect of carbonic-anhydrase inhibitors, benzothiadiazides and other diuretics, initial encounter: Secondary | ICD-10-CM | POA: Diagnosis not present

## 2013-10-03 DIAGNOSIS — IMO0001 Reserved for inherently not codable concepts without codable children: Secondary | ICD-10-CM | POA: Diagnosis present

## 2013-10-03 DIAGNOSIS — I359 Nonrheumatic aortic valve disorder, unspecified: Secondary | ICD-10-CM | POA: Diagnosis present

## 2013-10-03 DIAGNOSIS — E876 Hypokalemia: Secondary | ICD-10-CM | POA: Diagnosis present

## 2013-10-03 DIAGNOSIS — E785 Hyperlipidemia, unspecified: Secondary | ICD-10-CM | POA: Diagnosis present

## 2013-10-03 DIAGNOSIS — J189 Pneumonia, unspecified organism: Secondary | ICD-10-CM | POA: Diagnosis present

## 2013-10-03 LAB — STREP PNEUMONIAE URINARY ANTIGEN: Strep Pneumo Urinary Antigen: NEGATIVE

## 2013-10-03 LAB — URINALYSIS, ROUTINE W REFLEX MICROSCOPIC
BILIRUBIN URINE: NEGATIVE
Glucose, UA: NEGATIVE mg/dL
Hgb urine dipstick: NEGATIVE
Ketones, ur: NEGATIVE mg/dL
Leukocytes, UA: NEGATIVE
NITRITE: NEGATIVE
Protein, ur: NEGATIVE mg/dL
Specific Gravity, Urine: 1.008 (ref 1.005–1.030)
UROBILINOGEN UA: 0.2 mg/dL (ref 0.0–1.0)
pH: 7.5 (ref 5.0–8.0)

## 2013-10-03 LAB — CBC
HCT: 34.7 % — ABNORMAL LOW (ref 39.0–52.0)
HCT: 35.6 % — ABNORMAL LOW (ref 39.0–52.0)
HEMOGLOBIN: 10.8 g/dL — AB (ref 13.0–17.0)
Hemoglobin: 10.8 g/dL — ABNORMAL LOW (ref 13.0–17.0)
MCH: 27.3 pg (ref 26.0–34.0)
MCH: 26.7 pg (ref 26.0–34.0)
MCHC: 31.1 g/dL (ref 30.0–36.0)
MCHC: 30.3 g/dL (ref 30.0–36.0)
MCV: 87.8 fL (ref 78.0–100.0)
MCV: 87.9 fL (ref 78.0–100.0)
PLATELETS: 256 10*3/uL (ref 150–400)
Platelets: 231 10*3/uL (ref 150–400)
RBC: 3.95 MIL/uL — ABNORMAL LOW (ref 4.22–5.81)
RBC: 4.05 MIL/uL — ABNORMAL LOW (ref 4.22–5.81)
RDW: 14.5 % (ref 11.5–15.5)
RDW: 14.7 % (ref 11.5–15.5)
WBC: 10.7 10*3/uL — AB (ref 4.0–10.5)
WBC: 12.6 10*3/uL — ABNORMAL HIGH (ref 4.0–10.5)

## 2013-10-03 LAB — BASIC METABOLIC PANEL
Anion gap: 15 (ref 5–15)
BUN: 22 mg/dL (ref 6–23)
CALCIUM: 9.1 mg/dL (ref 8.4–10.5)
CO2: 28 mEq/L (ref 19–32)
Chloride: 98 mEq/L (ref 96–112)
Creatinine, Ser: 1.02 mg/dL (ref 0.50–1.35)
GFR, EST AFRICAN AMERICAN: 76 mL/min — AB (ref >90)
GFR, EST NON AFRICAN AMERICAN: 66 mL/min — AB (ref >90)
GLUCOSE: 166 mg/dL — AB (ref 70–99)
POTASSIUM: 3.5 meq/L — AB (ref 3.7–5.3)
Sodium: 141 mEq/L (ref 137–147)

## 2013-10-03 LAB — TROPONIN I
Troponin I: <0.3 ng/mL (ref <0.30)
Troponin I: <0.3 ng/mL (ref <0.30)

## 2013-10-03 LAB — HEMOGLOBIN A1C
HEMOGLOBIN A1C: 7.3 % — AB (ref <5.7)
Mean Plasma Glucose: 163 mg/dL — ABNORMAL HIGH (ref <117)

## 2013-10-03 LAB — CREATININE, SERUM
CREATININE: 1.05 mg/dL (ref 0.50–1.35)
GFR calc Af Amer: 74 mL/min — ABNORMAL LOW (ref >90)
GFR calc non Af Amer: 64 mL/min — ABNORMAL LOW (ref >90)

## 2013-10-03 LAB — GLUCOSE, CAPILLARY
Glucose-Capillary: 127 mg/dL — ABNORMAL HIGH (ref 70–99)
Glucose-Capillary: 146 mg/dL — ABNORMAL HIGH (ref 70–99)

## 2013-10-03 LAB — PRO B NATRIURETIC PEPTIDE: Pro B Natriuretic peptide (BNP): 3031 pg/mL — ABNORMAL HIGH (ref 0–450)

## 2013-10-03 LAB — CBG MONITORING, ED: Glucose-Capillary: 179 mg/dL — ABNORMAL HIGH (ref 70–99)

## 2013-10-03 MED ORDER — IPRATROPIUM BROMIDE 0.02 % IN SOLN
0.5000 mg | Freq: Four times a day (QID) | RESPIRATORY_TRACT | Status: DC
  Administered 2013-10-03 – 2013-10-05 (×9): 0.5 mg via RESPIRATORY_TRACT
  Filled 2013-10-03 (×10): qty 2.5

## 2013-10-03 MED ORDER — FLUTICASONE PROPIONATE 50 MCG/ACT NA SUSP: 1.0000 | Freq: Every day | NASAL | Status: DC | PRN

## 2013-10-03 MED ORDER — ACETAMINOPHEN 325 MG PO TABS: 650.0000 mg | ORAL_TABLET | ORAL | Status: DC | PRN

## 2013-10-03 MED ORDER — LEVALBUTEROL HCL 0.63 MG/3ML IN NEBU
0.6300 mg | INHALATION_SOLUTION | Freq: Four times a day (QID) | RESPIRATORY_TRACT | Status: DC
  Administered 2013-10-03 – 2013-10-05 (×9): 0.63 mg via RESPIRATORY_TRACT
  Filled 2013-10-03 (×17): qty 3

## 2013-10-03 MED ORDER — FUROSEMIDE 10 MG/ML IJ SOLN
40.0000 mg | Freq: Two times a day (BID) | INTRAMUSCULAR | Status: DC
  Administered 2013-10-03 – 2013-10-04 (×2): 40 mg via INTRAVENOUS
  Filled 2013-10-03 (×2): qty 4

## 2013-10-03 MED ORDER — DEXTROSE 5 % IV SOLN
1.0000 g | INTRAVENOUS | Status: DC
  Administered 2013-10-03: 1 g via INTRAVENOUS
  Filled 2013-10-03 (×2): qty 10

## 2013-10-03 MED ORDER — SODIUM CHLORIDE 0.9 % IV SOLN
250.0000 mL | INTRAVENOUS | Status: DC | PRN
Start: 2013-10-03 — End: 2013-10-05

## 2013-10-03 MED ORDER — POTASSIUM CHLORIDE CRYS ER 20 MEQ PO TBCR
40.0000 meq | EXTENDED_RELEASE_TABLET | Freq: Every day | ORAL | Status: DC
  Administered 2013-10-03 – 2013-10-05 (×3): 40 meq via ORAL
  Filled 2013-10-03 (×2): qty 2

## 2013-10-03 MED ORDER — ASPIRIN EC 81 MG PO TBEC
81.0000 mg | DELAYED_RELEASE_TABLET | Freq: Every day | ORAL | Status: DC
  Administered 2013-10-03 – 2013-10-04 (×2): 81 mg via ORAL
  Filled 2013-10-03 (×4): qty 1

## 2013-10-03 MED ORDER — INSULIN ASPART 100 UNIT/ML ~~LOC~~ SOLN: 0.0000 [IU] | Freq: Every day | SUBCUTANEOUS | Status: DC

## 2013-10-03 MED ORDER — ASPIRIN 81 MG PO TABS: 81.0000 mg | ORAL_TABLET | Freq: Every day | ORAL | Status: DC

## 2013-10-03 MED ORDER — ENOXAPARIN SODIUM 40 MG/0.4ML ~~LOC~~ SOLN: 40.0000 mg | SUBCUTANEOUS | Status: DC

## 2013-10-03 MED ORDER — PANTOPRAZOLE SODIUM 40 MG PO TBEC
80.0000 mg | DELAYED_RELEASE_TABLET | Freq: Every day | ORAL | Status: DC
  Administered 2013-10-04 – 2013-10-05 (×2): 80 mg via ORAL
  Filled 2013-10-03: qty 2

## 2013-10-03 MED ORDER — SODIUM CHLORIDE 0.9 % IJ SOLN: 3.0000 mL | INTRAMUSCULAR | Status: DC | PRN

## 2013-10-03 MED ORDER — ONE-DAILY MULTI VITAMINS PO TABS: 1.0000 | ORAL_TABLET | Freq: Every day | ORAL | Status: DC

## 2013-10-03 MED ORDER — SIMVASTATIN 40 MG PO TABS
40.0000 mg | ORAL_TABLET | Freq: Every day | ORAL | Status: DC
  Administered 2013-10-03 – 2013-10-04 (×2): 40 mg via ORAL
  Filled 2013-10-03 (×3): qty 1

## 2013-10-03 MED ORDER — LISINOPRIL 5 MG PO TABS
5.0000 mg | ORAL_TABLET | Freq: Every day | ORAL | Status: DC
  Administered 2013-10-03 – 2013-10-05 (×3): 5 mg via ORAL
  Filled 2013-10-03 (×3): qty 1

## 2013-10-03 MED ORDER — ADULT MULTIVITAMIN W/MINERALS CH
1.0000 | ORAL_TABLET | Freq: Every day | ORAL | Status: DC
  Administered 2013-10-03 – 2013-10-05 (×3): 1 via ORAL
  Filled 2013-10-03 (×3): qty 1

## 2013-10-03 MED ORDER — SODIUM CHLORIDE 0.9 % IJ SOLN
3.0000 mL | Freq: Two times a day (BID) | INTRAMUSCULAR | Status: DC
  Administered 2013-10-03 – 2013-10-05 (×3): 3 mL via INTRAVENOUS

## 2013-10-03 MED ORDER — INSULIN ASPART 100 UNIT/ML ~~LOC~~ SOLN
0.0000 [IU] | Freq: Three times a day (TID) | SUBCUTANEOUS | Status: DC
  Administered 2013-10-03: 1 [IU] via SUBCUTANEOUS
  Administered 2013-10-04: 3 [IU] via SUBCUTANEOUS
  Administered 2013-10-04 – 2013-10-05 (×3): 2 [IU] via SUBCUTANEOUS
  Administered 2013-10-05: 1 [IU] via SUBCUTANEOUS

## 2013-10-03 MED ORDER — DEXTROSE 5 % IV SOLN
500.0000 mg | Freq: Once | INTRAVENOUS | Status: AC
  Administered 2013-10-03: 500 mg via INTRAVENOUS
  Filled 2013-10-03: qty 500

## 2013-10-03 MED ORDER — DEXTROSE 5 % IV SOLN
500.0000 mg | INTRAVENOUS | Status: DC
  Administered 2013-10-03: 500 mg via INTRAVENOUS
  Filled 2013-10-03 (×2): qty 500

## 2013-10-03 MED ORDER — SODIUM CHLORIDE 0.9 % IJ SOLN
3.0000 mL | Freq: Two times a day (BID) | INTRAMUSCULAR | Status: DC
  Administered 2013-10-03 – 2013-10-05 (×5): 3 mL via INTRAVENOUS

## 2013-10-03 MED ORDER — ONDANSETRON HCL 4 MG/2ML IJ SOLN: 4.0000 mg | Freq: Four times a day (QID) | INTRAMUSCULAR | Status: DC | PRN

## 2013-10-03 MED ORDER — ENOXAPARIN SODIUM 40 MG/0.4ML ~~LOC~~ SOLN
40.0000 mg | SUBCUTANEOUS | Status: DC
  Administered 2013-10-03 – 2013-10-04 (×2): 40 mg via SUBCUTANEOUS
  Filled 2013-10-03 (×3): qty 0.4

## 2013-10-03 MED ORDER — CARVEDILOL 3.125 MG PO TABS
3.1250 mg | ORAL_TABLET | Freq: Two times a day (BID) | ORAL | Status: DC
  Administered 2013-10-03 – 2013-10-05 (×4): 3.125 mg via ORAL
  Filled 2013-10-03 (×6): qty 1

## 2013-10-03 MED ORDER — SODIUM CHLORIDE 0.9 % IV SOLN: 250.0000 mL | INTRAVENOUS | Status: DC | PRN

## 2013-10-03 MED ORDER — DEXTROSE 5 % IV SOLN
1.0000 g | Freq: Once | INTRAVENOUS | Status: AC
  Administered 2013-10-03: 1 g via INTRAVENOUS
  Filled 2013-10-03: qty 10

## 2013-10-03 NOTE — Evaluation (Signed)
Physical Therapy Evaluation Patient Details Name: Jimmy Dawson MRN: [id number] DOB: December 24, 1928 Today's Date: 10/03/2013   History of Present Illness  80 year old male with history of CAD, hypertension, diabetes, hyperlipidemia presented to ED with shortness of breath and coughing, worse in the last 4 days. Patient lives at home with his wife, reported that he has been feeling short of breath for at least a month, however his coughing with productive phlegm has worsened in the last 4 days. He reports yellowish phlegm, no significant fevers or chills but difficulty breathing.  pt adm 10/03/13 (day of PT evaluation)   Clinical Impression  Pt adm from home due to the above. Appears to be at baseline from mobility standpoint. Was able to ambulate hallway and up/down flight of steps without LOB and at mod I to supervision level. Pt educated on deep breathing technique throughout session for energy conservation.  No further acute PT needs warranted at this time. Thanks for the consult.     Follow Up Recommendations No PT follow up    Equipment Recommendations  None recommended by PT    Recommendations for Other Services       Precautions / Restrictions Precautions Precautions: None Restrictions Weight Bearing Restrictions: No      Mobility  Bed Mobility Overal bed mobility: Independent                Transfers Overall transfer level: Modified independent Equipment used: None             General transfer comment: no sway or LOB noted   Ambulation/Gait Ambulation/Gait assistance: Modified independent (Device/Increase time) Ambulation Distance (Feet): 250 Feet Assistive device: None Gait Pattern/deviations: Trunk flexed;Wide base of support (bil knees flexed) Gait velocity: WFL Gait velocity interpretation: at or above normal speed for age/gender General Gait Details: pt reports gt abnormalities are his baseline due to previous "mini stroke"; no LOB noted; pt  performed high level balance activities without LOB; O2 while ambulating on RA 87 % recovered quickly to 100% with cues for deep breathing   Stairs Stairs: Yes Stairs assistance: Supervision Stair Management: One rail Right;Alternating pattern;Forwards Number of Stairs: 9 General stair comments: demo good safety awareness with stair managment; min cues for safety; no LOB noted   Wheelchair Mobility    Modified Rankin (Stroke Patients Only)       Balance Overall balance assessment: Modified Independent                           High level balance activites: Direction changes;Sudden stops;Head turns;Turns High Level Balance Comments: no LOB or need for AD noted with high level balance activities              Pertinent Vitals/Pain No c/o pain; O2 on RA while ambulating 87%; recovered quickly to 100% with deep breathing cues    Home Living Family/patient expects to be discharged to:: Private residence Living Arrangements: Spouse/significant other Available Help at Discharge: Family;Available 24 hours/day Type of Home: House Home Access: Stairs to enter Entrance Stairs-Rails: Right;Left;Can reach both Entrance Stairs-Number of Steps: 4 Home Layout: Two level Home Equipment: None Additional Comments: pt has been able to go up to 2nd floor to shave and shower in the "big" bathroom  upstairs     Prior Function Level of Independence: Independent         Comments: drives and works in his shop for fun     [place]  Hand: Right    Extremity/Trunk Assessment   Upper Extremity Assessment: Overall WFL for tasks assessed           Lower Extremity Assessment: Overall WFL for tasks assessed      Cervical / Trunk Assessment: Normal  Communication   Communication: HOH  Cognition Arousal/Alertness: Awake/alert Behavior During Therapy: WFL for tasks assessed/performed Overall Cognitive Status: Within Functional Limits for tasks assessed                       General Comments      Exercises        Assessment/Plan    PT Assessment Patent does not need any further PT services  PT Diagnosis     PT Problem List    PT Treatment Interventions     PT Goals (Current goals can be found in the Care Plan section) Acute Rehab PT Goals PT Goal Formulation: No goals set, d/c therapy    Frequency     Barriers to discharge        Co-evaluation               End of Session   Activity Tolerance: Patient tolerated treatment well Patient left: in bed;with call bell/phone within reach;with bed alarm set;with nursing/sitter in room Nurse Communication: Mobility status;Precautions         Time: 1533-1550 PT Time Calculation (min): 17 min   Charges:   PT Evaluation $Initial PT Evaluation Tier I: 1 Procedure PT Treatments $Gait Training: 8-22 mins   PT G CodesGustavus Bryant, [place]  [phone] 10/03/2013, 4:10 PM

## 2013-10-03 NOTE — ED Provider Notes (Signed)
CSN: [id number]     Arrival date & time 10/03/13  5027 History   First MD Initiated Contact with Patient 10/03/13 [phone]     Chief Complaint  Patient presents with  . Shortness of Breath     (Consider location/radiation/quality/duration/timing/severity/associated sxs/prior Treatment) Patient is a 80 y.o. male presenting with shortness of breath.  Shortness of Breath Severity:  Moderate Onset quality:  Gradual Duration:  4 weeks (Worse over past few days.) Timing:  Constant Progression:  Worsening Chronicity:  New Context: activity   Context: not URI   Context comment:  Worse at night, when laying down. Relieved by:  Rest and sitting up Worsened by:  Exertion (Laying flat) Associated symptoms: cough and sputum production (clear)   Associated symptoms: no abdominal pain, no chest pain and no fever     Past Medical History  Diagnosis Date  . Type II or unspecified type diabetes mellitus without mention of complication, uncontrolled   . Hyperlipidemia   . Hypertensive heart disease   . Unspecified disorder of kidney and ureter   . Diastasis of muscle   . Elevated prostate specific antigen (PSA)   . GERD (gastroesophageal reflux disease)   . CAD (coronary artery disease)   . Aortic valve disease    Past Surgical History  Procedure Laterality Date  . Finger surgery  1955    RIGHT INDEX AND RIGHT MIDDLE FINGER  . Sp intro uret cath perc  03-1999  . Coronary angioplasty with stent placement  05-2000   Family History  Problem Relation Age of Onset  . Heart disease Mother   . Diabetes Mother   . Heart disease Father   . Heart attack Father   . Heart disease Sister   . Diabetes Sister   . Heart disease Brother   . Diabetes Brother   . Diabetes Brother   . Heart disease Brother   . Diabetes Brother   . Heart disease Brother    History  Substance Use Topics  . Smoking status: Former Smoker    Quit date: 04/02/1968  . Smokeless tobacco: Not on file  . Alcohol Use: No     Review of Systems  Constitutional: Negative for fever.  Respiratory: Positive for cough, sputum production (clear) and shortness of breath.   Cardiovascular: Negative for chest pain.  Gastrointestinal: Negative for abdominal pain.  All other systems reviewed and are negative.     Allergies  Review of patient's allergies indicates no known allergies.  Home Medications   Prior to Admission medications   Medication Sig Start Date End Date Taking? Authorizing Provider  aspirin 81 MG tablet Take 81 mg by mouth at bedtime.    Yes Historical Provider, MD  fluticasone (FLONASE) 50 MCG/ACT nasal spray Place 1 spray into both nostrils daily as needed for allergies or rhinitis.   Yes Historical Provider, MD  furosemide (LASIX) 40 MG tablet Take 40 mg by mouth daily as needed. Take one tablet once daily as needed to control swelling.   Yes Historical Provider, MD  losartan-hydrochlorothiazide (HYZAAR) 100-25 MG per tablet Take one tablet by mouth once daily to control blood pressure 06/19/13  Yes Estill Dooms, MD  metFORMIN (GLUCOPHAGE) 500 MG tablet Take 2 tablets (1,000 mg total) by mouth 2 (two) times daily with a meal. 06/19/13  Yes Estill Dooms, MD  Multiple Vitamin (MULTIVITAMIN) tablet Take 1 tablet by mouth daily.    Yes Historical Provider, MD  omeprazole (PRILOSEC) 40 MG capsule Take one capsule  twice daily for acid reduction 06/19/13  Yes Estill Dooms, MD  simvastatin (ZOCOR) 40 MG tablet Take one tablet by mouth once daily for cholesterol 06/29/13  Yes Tiffany L Reed, DO  FLUZONE HIGH-DOSE injection  12/24/12   Historical Provider, MD   BP 121/75  Pulse 100  Temp(Src) 97.8 F (36.6 C) (Oral)  Resp 29  SpO2 94% Physical Exam  Nursing note and vitals reviewed. Constitutional: He is oriented to person, place, and time. He appears well-developed and well-nourished. No distress.  HENT:  Head: Normocephalic and atraumatic.  Mouth/Throat: Oropharynx is clear and moist.  Eyes:  Conjunctivae are normal. Pupils are equal, round, and reactive to light. No scleral icterus.  Neck: Neck supple.  Cardiovascular: Normal rate, regular rhythm, normal heart sounds and intact distal pulses.   No murmur heard. Pulmonary/Chest: Breath sounds normal. No stridor. Tachypnea (mild) noted. No respiratory distress. He has no wheezes. He has no rales.  Coarse upper airway noise  Abdominal: Soft. He exhibits no distension. There is no tenderness.  Musculoskeletal: Normal range of motion. He exhibits edema (1+ BLE).  Neurological: He is alert and oriented to person, place, and time.  Skin: Skin is warm and dry. No rash noted.  Psychiatric: He has a normal mood and affect. His behavior is normal.    ED Course  Procedures (including critical care time) Labs Review Labs Reviewed  CBC - Abnormal; Notable for the following:    WBC 10.7 (*)    RBC 3.95 (*)    Hemoglobin 10.8 (*)    HCT 34.7 (*)    All other components within normal limits  PRO B NATRIURETIC PEPTIDE - Abnormal; Notable for the following:    Pro B Natriuretic peptide (BNP) 3031.0 (*)    All other components within normal limits  BASIC METABOLIC PANEL - Abnormal; Notable for the following:    Potassium 3.5 (*)    Glucose, Bld 166 (*)    GFR calc non Af Amer 66 (*)    GFR calc Af Amer 76 (*)    All other components within normal limits  CBG MONITORING, ED - Abnormal; Notable for the following:    Glucose-Capillary 179 (*)    All other components within normal limits  CULTURE, BLOOD (ROUTINE X 2)  CULTURE, BLOOD (ROUTINE X 2)  TROPONIN I    Imaging Review Dg Chest 2 View  10/03/2013   CLINICAL DATA:  Productive cough for 2 weeks  EXAM: CHEST  2 VIEW  COMPARISON:  06/27/2007  FINDINGS: Left basilar airspace disease which may reflect atelectasis versus developing pneumonia.  There is no pleural effusion or pneumothorax. The heart and mediastinal contours are unremarkable. There is thoracic aortic atherosclerosis.  The  osseous structures are unremarkable.  IMPRESSION: Left basilar airspace disease which may reflect atelectasis versus developing pneumonia. Follow up PA and lateral chest recommended.   Electronically Signed   By: Kathreen Devoid   On: 10/03/2013 10:46   All radiology studies independently viewed by me.      EKG Interpretation   Date/Time:  Saturday October 03 2013 09:39:16 EDT Ventricular Rate:  105 PR Interval:  167 QRS Duration: 86 QT Interval:  382 QTC Calculation: 505 R Axis:   -52 Text Interpretation:  Sinus tachycardia LAE, consider biatrial enlargement  Left anterior fascicular block Anteroseptal infarct, old Nonspecific T  abnormalities, lateral leads Prolonged QT interval compared to prior, rate  faster and now with LAFB morphology Confirmed by [facility]  MD, TREY ([id number])  on 10/03/2013 10:42:09 AM      MDM   Final diagnoses:  CAP (community acquired pneumonia)    80 year old male presenting with shortness of breath and cough. Symptoms have been progressive over past 4 weeks, but much worse in the past few days. I suspect he has developed some baseline congestive heart failure with an acute pneumonia on top. We'll treat for community acquired pneumonia. Admit to internal medicine.    Houston Siren III, MD 10/03/13 [phone]

## 2013-10-03 NOTE — H&P (Signed)
History and Physical       Hospital Admission Note Date: 10/03/2013  Patient name: Jimmy Dawson Medical record number: [id number] Date of birth: 25-Mar-1929 Age: 80 y.o. Gender: male  PCP: GREEN, Viviann Spare, MD    Chief Complaint:  Shortness of breath with coughing worse over the last 4 days  HPI: Patient is a 80 year old male with history of CAD, hypertension, diabetes, hyperlipidemia presented to ED with shortness of breath and coughing, worse in the last 4 days. Patient lives at home with his wife, reported that he has been feeling short of breath for at least a month, however his coughing with productive phlegm has worsened in the last 4 days. He reports yellowish phlegm, no significant fevers or chills but difficulty breathing. He also reports that he sleeps in a recliner and has noticed swelling in his feet or ankles, abdominal distention. BNP in ED 3031, chest x-ray showed left basilar airspace disease may reflect that atelectasis versus developing pneumonia  Review of Systems:  Constitutional: Denies fever, chills, diaphoresis, + poor appetite and fatigue.  HEENT: Denies photophobia, eye pain, redness, hearing loss, ear pain, + congestion, sore throat, rhinorrhea, sneezing, mouth sores, trouble swallowing, neck pain, neck stiffness and tinnitus.   Respiratory: Please see history of present illness Cardiovascular: Denies chest pain, palpitations, + feet and ankle swelling.  Gastrointestinal: Denies nausea, vomiting, abdominal pain, diarrhea, constipation, blood in stool and abdominal distention.  Genitourinary: Denies dysuria, urgency, frequency, hematuria, flank pain and difficulty urinating.  Musculoskeletal: Denies myalgias, back pain, joint swelling, arthralgias and gait problem.  Skin: Denies pallor, rash and wound.  Neurological: Denies dizziness, seizures, syncope, weakness, light-headedness, numbness and headaches.   Hematological: Denies adenopathy. Easy bruising, personal or family bleeding history  Psychiatric/Behavioral: Denies suicidal ideation, mood changes, confusion, nervousness, sleep disturbance and agitation  Past Medical History: Past Medical History  Diagnosis Date  . Type II or unspecified type diabetes mellitus without mention of complication, uncontrolled   . Hyperlipidemia   . Hypertensive heart disease   . Unspecified disorder of kidney and ureter   . Diastasis of muscle   . Elevated prostate specific antigen (PSA)   . GERD (gastroesophageal reflux disease)   . CAD (coronary artery disease)   . Aortic valve disease    Past Surgical History  Procedure Laterality Date  . Finger surgery  1955    RIGHT INDEX AND RIGHT MIDDLE FINGER  . Sp intro uret cath perc  03-1999  . Coronary angioplasty with stent placement  05-2000    Medications: Prior to Admission medications   Medication Sig Start Date End Date Taking? Authorizing Provider  aspirin 81 MG tablet Take 81 mg by mouth at bedtime.    Yes Historical Provider, MD  fluticasone (FLONASE) 50 MCG/ACT nasal spray Place 1 spray into both nostrils daily as needed for allergies or rhinitis.   Yes Historical Provider, MD  furosemide (LASIX) 40 MG tablet Take 40 mg by mouth daily as needed. Take one tablet once daily as needed to control swelling.   Yes Historical Provider, MD  losartan-hydrochlorothiazide (HYZAAR) 100-25 MG per tablet Take one tablet by mouth once daily to control blood pressure 06/19/13  Yes Estill Dooms, MD  metFORMIN (GLUCOPHAGE) 500 MG tablet Take 2 tablets (1,000 mg total) by mouth 2 (two) times daily with a meal. 06/19/13  Yes Estill Dooms, MD  Multiple Vitamin (MULTIVITAMIN) tablet Take 1 tablet by mouth daily.    Yes Historical Provider, MD  omeprazole ([place])  40 MG capsule Take one capsule twice daily for acid reduction 06/19/13  Yes Estill Dooms, MD  simvastatin (ZOCOR) 40 MG tablet Take one tablet by  mouth once daily for cholesterol 06/29/13  Yes Ohlman, DO  [place] injection  12/24/12   Historical Provider, MD    Allergies:  No Known Allergies  Social History:  reports that he quit smoking about 45 years ago. He does not have any smokeless tobacco history on file. He reports that he does not drink alcohol or use illicit drugs.  Family History: Family History  Problem Relation Age of Onset  . Heart disease Mother   . Diabetes Mother   . Heart disease Father   . Heart attack Father   . Heart disease Sister   . Diabetes Sister   . Heart disease Brother   . Diabetes Brother   . Diabetes Brother   . Heart disease Brother   . Diabetes Brother   . Heart disease Brother     Physical Exam: Blood pressure 117/68, pulse 98, temperature 97.8 F (36.6 C), temperature source Oral, resp. rate 25, SpO2 96.00%. General: Alert, awake, oriented x3, in no acute distress. HEENT: normocephalic, atraumatic, anicteric sclera, pink conjunctiva, pupils equal and reactive to light and accomodation, oropharynx clear Neck: supple, no masses or lymphadenopathy, no goiter, no bruits  Heart: Regular rate and rhythm, without murmurs, rubs or gallops. Lungs: Coarse breath sounds bilaterally Abdomen: Soft, nontender, nondistended, positive bowel sounds, no masses. Extremities: No clubbing, cyanosis, 1+ edema with positive pedal pulses. Neuro: Grossly intact, no focal neurological deficits, strength 5/5 upper and lower extremities bilaterally Psych: alert and oriented x 3, normal mood and affect Skin: no rashes or lesions, warm and dry   LABS on Admission:  Basic Metabolic Panel:  Recent Labs Lab 10/03/13 1001  NA 141  K 3.5*  CL 98  CO2 28  GLUCOSE 166*  BUN 22  CREATININE 1.02  CALCIUM 9.1   Liver Function Tests: No results found for this basename: AST, ALT, ALKPHOS, BILITOT, PROT, ALBUMIN,  in the last 168 hours No results found for this basename: LIPASE, AMYLASE,  in  the last 168 hours No results found for this basename: AMMONIA,  in the last 168 hours CBC:  Recent Labs Lab 10/03/13 1001  WBC 10.7*  HGB 10.8*  HCT 34.7*  MCV 87.8  PLT 256   Cardiac Enzymes:  Recent Labs Lab 10/03/13 1001  TROPONINI <0.30   BNP: No components found with this basename: POCBNP,  CBG:  Recent Labs Lab 10/03/13 0953  GLUCAP 179*     Radiological Exams on Admission: Dg Chest 2 View  10/03/2013   CLINICAL DATA:  Productive cough for 2 weeks  EXAM: CHEST  2 VIEW  COMPARISON:  06/27/2007  FINDINGS: Left basilar airspace disease which may reflect atelectasis versus developing pneumonia.  There is no pleural effusion or pneumothorax. The heart and mediastinal contours are unremarkable. There is thoracic aortic atherosclerosis.  The osseous structures are unremarkable.  IMPRESSION: Left basilar airspace disease which may reflect atelectasis versus developing pneumonia. Follow up PA and lateral chest recommended.   Electronically Signed   By: Kathreen Devoid   On: 10/03/2013 10:46    Assessment/Plan Principal Problem:   Acute respiratory failure due to community acquired pneumonia and acute exacerbation of CHF - Admit to telemetry, placed on IV Zithromax, Rocephin, Xopenex and Atrovent scheduled nebs, O2 - Obtain blood cultures, sputum cultures, urine legionella antigen, urine strep  antigen - Place on IV diuresis, see below  Active Problems: Acute exacerbation of CHF: Elevated BNP with pulmonary congestion, orthopnea, PND, pedal edema suggest acute and chronic CHF, patient was on Lasix as needed PTA - Prior echo in 2013 showed EF of 45-50%, placed on CHF pathway, daily weights, strict I's and O's - Placed on IV Lasix, Coreg, lisinopril, continue aspirin - Obtain 2-D echo, serial troponin    Hyperlipidemia - Continue Zocor    CAD (coronary artery disease) - Obtain 2-D echo, management as #2  Hypertension:  - Currently stable    Hypokalemia -  Replaced  Diabetes mellitus:  - Hold metformin, placed on sliding scale insulin  DVT prophylaxis: Lovenox  CODE STATUS: Full code  Family Communication: Admission, patients condition and plan of care including tests being ordered have been discussed with the patient who indicates understanding and agree with the plan and Code Status   Further plan will depend as patient's clinical course evolves and further radiologic and laboratory data become available.   Time Spent on Admission: 1 hour  Laporchia Nakajima M.D. [facility] 10/03/2013, 1:35 PM Pager: [phone]  If 7PM-7AM, please contact night-coverage [email or web address] Password [email or web address]  **Disclaimer: This note was dictated with voice recognition software. Similar sounding words can inadvertently be transcribed and this note may contain transcription errors which may not have been corrected upon publication of note.**

## 2013-10-03 NOTE — Progress Notes (Signed)
Sputum cup placed in room, RN made aware.

## 2013-10-03 NOTE — ED Notes (Signed)
Pt reports feeling SHOB that started 4 weeks ago . Pt also reports a productive cough.

## 2013-10-04 ENCOUNTER — Inpatient Hospital Stay (HOSPITAL_COMMUNITY): Payer: Medicare Other

## 2013-10-04 DIAGNOSIS — I5031 Acute diastolic (congestive) heart failure: Secondary | ICD-10-CM

## 2013-10-04 DIAGNOSIS — I359 Nonrheumatic aortic valve disorder, unspecified: Secondary | ICD-10-CM

## 2013-10-04 LAB — BASIC METABOLIC PANEL
ANION GAP: 12 (ref 5–15)
BUN: 22 mg/dL (ref 6–23)
CALCIUM: 9.1 mg/dL (ref 8.4–10.5)
CO2: 33 meq/L — AB (ref 19–32)
Chloride: 95 mEq/L — ABNORMAL LOW (ref 96–112)
Creatinine, Ser: 1.17 mg/dL (ref 0.50–1.35)
GFR calc Af Amer: 65 mL/min — ABNORMAL LOW (ref >90)
GFR calc non Af Amer: 56 mL/min — ABNORMAL LOW (ref >90)
Glucose, Bld: 137 mg/dL — ABNORMAL HIGH (ref 70–99)
POTASSIUM: 3.5 meq/L — AB (ref 3.7–5.3)
Sodium: 140 mEq/L (ref 137–147)

## 2013-10-04 LAB — GLUCOSE, CAPILLARY
GLUCOSE-CAPILLARY: 187 mg/dL — AB (ref 70–99)
Glucose-Capillary: 153 mg/dL — ABNORMAL HIGH (ref 70–99)
Glucose-Capillary: 244 mg/dL — ABNORMAL HIGH (ref 70–99)
Glucose-Capillary: 108 mg/dL — ABNORMAL HIGH (ref 70–99)

## 2013-10-04 LAB — CBC
HCT: 33.1 % — ABNORMAL LOW (ref 39.0–52.0)
Hemoglobin: 10.4 g/dL — ABNORMAL LOW (ref 13.0–17.0)
MCH: 27.2 pg (ref 26.0–34.0)
MCHC: 31.4 g/dL (ref 30.0–36.0)
MCV: 86.6 fL (ref 78.0–100.0)
PLATELETS: 263 10*3/uL (ref 150–400)
RBC: 3.82 MIL/uL — AB (ref 4.22–5.81)
RDW: 14.6 % (ref 11.5–15.5)
WBC: 10.1 10*3/uL (ref 4.0–10.5)

## 2013-10-04 LAB — LEGIONELLA ANTIGEN, URINE: Legionella Antigen, Urine: NEGATIVE

## 2013-10-04 LAB — TROPONIN I: Troponin I: <0.3 ng/mL (ref <0.30)

## 2013-10-04 MED ORDER — LEVOFLOXACIN 500 MG PO TABS
500.0000 mg | ORAL_TABLET | Freq: Every day | ORAL | Status: DC
  Administered 2013-10-04 – 2013-10-05 (×2): 500 mg via ORAL
  Filled 2013-10-04 (×2): qty 1

## 2013-10-04 MED ORDER — FUROSEMIDE 10 MG/ML IJ SOLN
20.0000 mg | Freq: Two times a day (BID) | INTRAMUSCULAR | Status: DC
  Administered 2013-10-04 – 2013-10-05 (×2): 20 mg via INTRAVENOUS
  Filled 2013-10-04 (×2): qty 2

## 2013-10-04 MED ORDER — AZITHROMYCIN 500 MG PO TABS
500.0000 mg | ORAL_TABLET | Freq: Every day | ORAL | Status: DC
  Filled 2013-10-04: qty 1

## 2013-10-04 NOTE — Progress Notes (Signed)
[facility] PROGRESS NOTE  Agastya Meister KPT:[id number] DOB: Feb 25, 1929 DOA: 10/03/2013 PCP: Estill Dooms, MD  Assessment/Plan: Principal Problem: 1. Acute respiratory failure: Much improved. Secondary to heart failure and pneumonia. Weaned off of oxygen, check ambulatory pulse ox  Other medical issues 2. Hyperlipidemia: Continue statin  3. CAD (coronary artery disease) 4. Hypertensive heart disease  5. CAP (community acquired pneumonia): White count normalized. Change IV antibiotics to by mouth.  6. Acute exacerbation of CHF (congestive heart failure): Mild diastolic dysfunction. Await followup echocardiogram. Continue IV Lasix. Likely should be fully diuresed by tomorrow 7. Hypokalemia: Secondary to diuresis. Replacing.   Code Status: Full code  Family Communication: Spoke with wife by phone Disposition Plan: Home likely tomorrow. Once weaned off of oxygen   Consultants:  None  Procedures:  Echocardiogram-results pending  Antibiotics:  IV Rocephin 7/4 only  By mouth Zithromax 7/4-7/5  Levaquin by mouth 7/5-present  HPI/Subjective: Patient feeling much better, feels that breathing is close to baseline  Objective: Filed Vitals:   10/04/13 1348  BP: 109/68  Pulse: 86  Temp: 97.5 F (36.4 C)  Resp: 20    Intake/Output Summary (Last 24 hours) at 10/04/13 1521 Last data filed at 10/04/13 1321  Gross per 24 hour  Intake   1220 ml  Output   2550 ml  Net  -1330 ml   Filed Weights   10/03/13 1357 10/04/13 0341  Weight: 92.5 kg (203 lb 14.8 oz) 89.2 kg (196 lb 10.4 oz)    Exam:   General:  Alert and oriented x3, no acute distress  Cardiovascular: Regular rate and rhythm, S1-S2  Respiratory: Decreased breath sounds bibasilar  Abdomen: Soft, nontender, nondistended, positive bowel sounds  Musculoskeletal: No clubbing or cyanosis, trace edema   Data Reviewed: Basic Metabolic Panel:  Recent Labs Lab 10/03/13 1001 10/03/13 1602  10/04/13 0116  NA 141  --  140  K 3.5*  --  3.5*  CL 98  --  95*  CO2 28  --  33*  GLUCOSE 166*  --  137*  BUN 22  --  22  CREATININE 1.02 1.05 1.17  CALCIUM 9.1  --  9.1   Liver Function Tests: No results found for this basename: AST, ALT, ALKPHOS, BILITOT, PROT, ALBUMIN,  in the last 168 hours No results found for this basename: LIPASE, AMYLASE,  in the last 168 hours No results found for this basename: AMMONIA,  in the last 168 hours CBC:  Recent Labs Lab 10/03/13 1001 10/03/13 1602 10/04/13 0116  WBC 10.7* 12.6* 10.1  HGB 10.8* 10.8* 10.4*  HCT 34.7* 35.6* 33.1*  MCV 87.8 87.9 86.6  PLT 256 231 263   Cardiac Enzymes:  Recent Labs Lab 10/03/13 1001 10/03/13 1602 10/03/13 2010 10/04/13 0116  TROPONINI <0.30 <0.30 <0.30 <0.30   BNP (last 3 results)  Recent Labs  10/03/13 1001  PROBNP 3031.0*   CBG:  Recent Labs Lab 10/03/13 0953 10/03/13 1601 10/03/13 2111 10/04/13 0802 10/04/13 1116  GLUCAP 179* 127* 146* 153* 187*    Recent Results (from the past 240 hour(s))  CULTURE, BLOOD (ROUTINE X 2)     Status: None   Collection Time    10/03/13 11:12 AM      Result Value Ref Range Status   Specimen Description BLOOD RIGHT ARM   Final   Special Requests BOTTLES DRAWN AEROBIC AND ANAEROBIC [facility]   Final   Culture  Setup Time     Final   Value: 10/03/2013 18:30  Performed at [facility]     Final   Value:        BLOOD CULTURE RECEIVED NO GROWTH TO DATE CULTURE WILL BE HELD FOR 5 DAYS BEFORE ISSUING A FINAL NEGATIVE REPORT     Performed at [facility]   Report Status PENDING   Incomplete  CULTURE, BLOOD (ROUTINE X 2)     Status: None   Collection Time    10/03/13 11:20 AM      Result Value Ref Range Status   Specimen Description BLOOD LEFT ARM   Final   Special Requests BOTTLES DRAWN AEROBIC AND ANAEROBIC 6CC   Final   Culture  Setup Time     Final   Value: 10/03/2013 18:30     Performed at [facility]    Culture     Final   Value:        BLOOD CULTURE RECEIVED NO GROWTH TO DATE CULTURE WILL BE HELD FOR 5 DAYS BEFORE ISSUING A FINAL NEGATIVE REPORT     Performed at [facility]   Report Status PENDING   Incomplete  CULTURE, BLOOD (ROUTINE X 2)     Status: None   Collection Time    10/03/13  4:02 PM      Result Value Ref Range Status   Specimen Description BLOOD RIGHT ARM   Final   Special Requests BOTTLES DRAWN AEROBIC ONLY 10CC   Final   Culture  Setup Time     Final   Value: 10/03/2013 19:49     Performed at [facility]   Culture     Final   Value:        BLOOD CULTURE RECEIVED NO GROWTH TO DATE CULTURE WILL BE HELD FOR 5 DAYS BEFORE ISSUING A FINAL NEGATIVE REPORT     Performed at [facility]   Report Status PENDING   Incomplete  CULTURE, BLOOD (ROUTINE X 2)     Status: None   Collection Time    10/03/13  4:10 PM      Result Value Ref Range Status   Specimen Description BLOOD LEFT ARM   Final   Special Requests BOTTLES DRAWN AEROBIC ONLY 10CC   Final   Culture  Setup Time     Final   Value: 10/03/2013 19:49     Performed at [facility]   Culture     Final   Value:        BLOOD CULTURE RECEIVED NO GROWTH TO DATE CULTURE WILL BE HELD FOR 5 DAYS BEFORE ISSUING A FINAL NEGATIVE REPORT     Performed at [facility]   Report Status PENDING   Incomplete     Studies: Dg Chest 2 View  10/04/2013   CLINICAL DATA:  Shortness of breath  EXAM: CHEST  2 VIEW  COMPARISON:  10/03/2013  FINDINGS: Patchy changes are again noted in the left lung base. The cardiac shadow is stable. The lungs are otherwise clear. No acute bony abnormality is seen. Stable compression deformity is noted in the mid thoracic spine.  IMPRESSION: Stable left basilar changes.   Electronically Signed   By: Inez Catalina M.D.   On: 10/04/2013 08:06   Dg Chest 2 View  10/03/2013   CLINICAL DATA:  Productive cough for 2 weeks  EXAM: CHEST  2 VIEW  COMPARISON:  06/27/2007  FINDINGS: Left  basilar airspace disease which may reflect atelectasis versus developing pneumonia.  There is  no pleural effusion or pneumothorax. The heart and mediastinal contours are unremarkable. There is thoracic aortic atherosclerosis.  The osseous structures are unremarkable.  IMPRESSION: Left basilar airspace disease which may reflect atelectasis versus developing pneumonia. Follow up PA and lateral chest recommended.   Electronically Signed   By: Kathreen Devoid   On: 10/03/2013 10:46    Scheduled Meds: . aspirin EC  81 mg Oral QHS  . carvedilol  3.125 mg Oral BID WC  . enoxaparin (LOVENOX) injection  40 mg Subcutaneous Q24H  . furosemide  20 mg Intravenous Q12H  . insulin aspart  0-5 Units Subcutaneous QHS  . insulin aspart  0-9 Units Subcutaneous TID WC  . levalbuterol  0.63 mg Nebulization Q6H   And  . ipratropium  0.5 mg Nebulization Q6H  . levofloxacin  500 mg Oral Daily  . lisinopril  5 mg Oral Daily  . multivitamin with minerals  1 tablet Oral Daily  . pantoprazole  80 mg Oral Daily  . potassium chloride  40 mEq Oral Daily  . simvastatin  40 mg Oral q1800  . sodium chloride  3 mL Intravenous Q12H  . sodium chloride  3 mL Intravenous Q12H   Continuous Infusions:   Principal Problem:   Acute respiratory failure Active Problems:   Hyperlipidemia   CAD (coronary artery disease)   Hypertensive heart disease   CAP (community acquired pneumonia)   Acute exacerbation of CHF (congestive heart failure)   Hypokalemia    Time spent: 15 minutes    [facility] Pager [phone]. If 7PM-7AM, please contact night-coverage at [email or web address], password [email or web address] 10/04/2013, 3:21 PM  LOS: 1 day

## 2013-10-04 NOTE — Progress Notes (Signed)
Echocardiogram 2D Echocardiogram has been performed.  Jimmy Dawson 10/04/2013, 9:35 AM

## 2013-10-05 DIAGNOSIS — E785 Hyperlipidemia, unspecified: Secondary | ICD-10-CM

## 2013-10-05 LAB — BASIC METABOLIC PANEL
Anion gap: 14 (ref 5–15)
BUN: 26 mg/dL — AB (ref 6–23)
CALCIUM: 9.2 mg/dL (ref 8.4–10.5)
CO2: 29 mEq/L (ref 19–32)
Chloride: 96 mEq/L (ref 96–112)
Creatinine, Ser: 1.18 mg/dL (ref 0.50–1.35)
GFR calc Af Amer: 64 mL/min — ABNORMAL LOW (ref >90)
GFR, EST NON AFRICAN AMERICAN: 55 mL/min — AB (ref >90)
GLUCOSE: 141 mg/dL — AB (ref 70–99)
Potassium: 3.7 mEq/L (ref 3.7–5.3)
Sodium: 139 mEq/L (ref 137–147)

## 2013-10-05 LAB — URINE CULTURE
CULTURE: NO GROWTH
Colony Count: NO GROWTH

## 2013-10-05 LAB — GLUCOSE, CAPILLARY
GLUCOSE-CAPILLARY: 145 mg/dL — AB (ref 70–99)
Glucose-Capillary: 195 mg/dL — ABNORMAL HIGH (ref 70–99)

## 2013-10-05 LAB — PRO B NATRIURETIC PEPTIDE: PRO B NATRI PEPTIDE: 1639 pg/mL — AB (ref 0–450)

## 2013-10-05 MED ORDER — CARVEDILOL 3.125 MG PO TABS: 3.1250 mg | ORAL_TABLET | Freq: Two times a day (BID) | ORAL | Status: DC

## 2013-10-05 MED ORDER — AZITHROMYCIN 500 MG PO TABS: 500.0000 mg | ORAL_TABLET | Freq: Every day | ORAL | Status: AC

## 2013-10-05 NOTE — Progress Notes (Signed)
UR completed Lise Pincus K. Tamaya Pun, RN, BSN, [place], CCM  10/05/2013 3:07 PM

## 2013-10-05 NOTE — Discharge Instructions (Signed)

## 2013-10-05 NOTE — Progress Notes (Signed)
CARDIAC REHAB PHASE I   PRE:  Rate/Rhythm: 90 SR    BP: sitting 104/70    SaO2: 95 RA  MODE:  Ambulation: 380 ft   POST:  Rate/Rhythm: 119 ST    BP: sitting 128/80     SaO2: 98 RA  Pt feeling better, denies SOB walking. HR up to 119 ST. SaO2 98 RA. Pt sts he has weak hips since his CVA and requires rest at times. Discussed low sodium, daily wts and walking daily. Pt voiced understanding with good retention. Gave HF booklet and walking guidelines.  [id number]   Josephina Shih Grayson CES, ACSM 10/05/2013 11:26 AM     90 SR

## 2013-10-05 NOTE — Discharge Summary (Signed)
Physician Discharge Summary  Jimmy Dawson WUJ:[id number] DOB: 11-Mar-1929 DOA: 10/03/2013  PCP: Estill Dooms, MD  Admit date: 10/03/2013 Discharge date: 10/05/2013  Time spent: 25 minutes  Recommendations for Outpatient Follow-up:  1. New medication: Coreg 3.125 mg by mouth twice a day 2. Patient will followup with his primary care physician in the next 2 weeks 3. New medication: Zithromax 500 mg by mouth daily times the next 4 days  Discharge Diagnoses:  Principal Problem:   Acute respiratory failure Active Problems:   Hyperlipidemia   CAD (coronary artery disease)   Hypertensive heart disease   CAP (community acquired pneumonia)   Acute diastolic congestive heart failure   Hypokalemia   Discharge Condition: improved, being discharged home  Diet recommendation: heart healthy  Filed Weights   10/03/13 1357 10/04/13 0341 10/05/13 0524  Weight: 92.5 kg (203 lb 14.8 oz) 89.2 kg (196 lb 10.4 oz) 85.367 kg (188 lb 3.2 oz)    History of present illness:  Patient is an 80 year old white male with past medical history of diastolic heart failure and diabetes who presented to the emergency room on 7/4 with 4 days of shortness of breath plus cough with yellowish phlegm. In the emergency room, he was noted to have a BNP of 3031 and a chest x-ray noting left basilar air space disease. Patient was started on Lasix and antibiotics and admitted to the hospitalist service.  Hospital Course:  Principal Problem:   Acute respiratory failure: Patient responded well to oxygen plus antibiotics plus diuretics. By day of discharge, he was able to ambulate in the hallway off of oxygen maintaining oxygen saturations at 95%. Active Problems:   Hyperlipidemia: Stable continue on statin   CAD (coronary artery disease)   Hypertensive heart disease: Stable.see below for addition of beta blocker.    CAP (community acquired pneumonia): Patient initially started on IV Rocephin and Zithromax. By day  discharge was changed over to by mouth Zithromax.    Acute diastolic congestive heart failure:with diuretics, patient initially diuresed 1.5 L and was down 5 pounds from admission upon discharge. It was noted that he was not on a beta blocker and Coreg was added low-dose to his regimen.already on an ACE inhibitor.   Hypokalemia: With diuresis, patient required potassium replacement  Procedures:  none  Consultations:  none  Discharge Exam: Filed Vitals:   10/05/13 0524  BP: 108/71  Pulse: 88  Temp: 97.8 F (36.6 C)  Resp: 18    General: Alert and oriented x3, no acute distress Cardiovascular: regular rate and rhythm, N5-A2, soft 2/6 systolic ejection murmur Respiratory: clear to auscultation bilaterally  Discharge Instructions You were cared for by a hospitalist during your hospital stay. If you have any questions about your discharge medications or the care you received while you were in the hospital after you are discharged, you can call the unit and asked to speak with the hospitalist on call if the hospitalist that took care of you is not available. Once you are discharged, your primary care physician will handle any further medical issues. Please note that NO REFILLS for any discharge medications will be authorized once you are discharged, as it is imperative that you return to your primary care physician (or establish a relationship with a primary care physician if you do not have one) for your aftercare needs so that they can reassess your need for medications and monitor your lab values.  Discharge Instructions   Diet - low sodium heart healthy  Complete by:  As directed      Increase activity slowly    Complete by:  As directed             Medication List         aspirin 81 MG tablet  Take 81 mg by mouth at bedtime.     azithromycin 500 MG tablet  Commonly known as:  ZITHROMAX  Take 1 tablet (500 mg total) by mouth daily.     carvedilol 3.125 MG tablet   Commonly known as:  COREG  Take 1 tablet (3.125 mg total) by mouth 2 (two) times daily with a meal.     fluticasone 50 MCG/ACT nasal spray  Commonly known as:  FLONASE  Place 1 spray into both nostrils daily as needed for allergies or rhinitis.     FLUZONE HIGH-DOSE injection  Generic drug:  influenza vac split trivalent high-dose     furosemide 40 MG tablet  Commonly known as:  LASIX  Take 40 mg by mouth daily as needed. Take one tablet once daily as needed to control swelling.     losartan-hydrochlorothiazide 100-25 MG per tablet  Commonly known as:  HYZAAR  Take one tablet by mouth once daily to control blood pressure     metFORMIN 500 MG tablet  Commonly known as:  GLUCOPHAGE  Take 2 tablets (1,000 mg total) by mouth 2 (two) times daily with a meal.     multivitamin tablet  Take 1 tablet by mouth daily.     omeprazole 40 MG capsule  Commonly known as:  PRILOSEC  Take one capsule twice daily for acid reduction     simvastatin 40 MG tablet  Commonly known as:  ZOCOR  Take one tablet by mouth once daily for cholesterol       No Known Allergies     Follow-up Information   Follow up with GREEN, Viviann Spare, MD In 1 month. (July 22 @1130am )    Specialty:  Internal Medicine   Contact information:   [place] [address] [phone]        The results of significant diagnostics from this hospitalization (including imaging, microbiology, ancillary and laboratory) are listed below for reference.    Significant Diagnostic Studies: Dg Chest 2 View  10/04/2013   CLINICAL DATA:  Shortness of breath  EXAM: CHEST  2 VIEW  COMPARISON:  10/03/2013  FINDINGS: Patchy changes are again noted in the left lung base. The cardiac shadow is stable. The lungs are otherwise clear. No acute bony abnormality is seen. Stable compression deformity is noted in the mid thoracic spine.  IMPRESSION: Stable left basilar changes.   Electronically Signed   By: Inez Catalina M.D.    On: 10/04/2013 08:06   Dg Chest 2 View  10/03/2013   CLINICAL DATA:  Productive cough for 2 weeks  EXAM: CHEST  2 VIEW  COMPARISON:  06/27/2007  FINDINGS: Left basilar airspace disease which may reflect atelectasis versus developing pneumonia.  There is no pleural effusion or pneumothorax. The heart and mediastinal contours are unremarkable. There is thoracic aortic atherosclerosis.  The osseous structures are unremarkable.  IMPRESSION: Left basilar airspace disease which may reflect atelectasis versus developing pneumonia. Follow up PA and lateral chest recommended.   Electronically Signed   By: Kathreen Devoid   On: 10/03/2013 10:46    Microbiology: Recent Results (from the past 240 hour(s))  CULTURE, BLOOD (ROUTINE X 2)     Status: None  Collection Time    10/03/13 11:12 AM      Result Value Ref Range Status   Specimen Description BLOOD RIGHT ARM   Final   Special Requests BOTTLES DRAWN AEROBIC AND ANAEROBIC [facility]   Final   Culture  Setup Time     Final   Value: 10/03/2013 18:30     Performed at [facility]   Culture     Final   Value:        BLOOD CULTURE RECEIVED NO GROWTH TO DATE CULTURE WILL BE HELD FOR 5 DAYS BEFORE ISSUING A FINAL NEGATIVE REPORT     Performed at [facility]   Report Status PENDING   Incomplete  CULTURE, BLOOD (ROUTINE X 2)     Status: None   Collection Time    10/03/13 11:20 AM      Result Value Ref Range Status   Specimen Description BLOOD LEFT ARM   Final   Special Requests BOTTLES DRAWN AEROBIC AND ANAEROBIC 6CC   Final   Culture  Setup Time     Final   Value: 10/03/2013 18:30     Performed at [facility]   Culture     Final   Value:        BLOOD CULTURE RECEIVED NO GROWTH TO DATE CULTURE WILL BE HELD FOR 5 DAYS BEFORE ISSUING A FINAL NEGATIVE REPORT     Performed at [facility]   Report Status PENDING   Incomplete  CULTURE, BLOOD (ROUTINE X 2)     Status: None   Collection Time    10/03/13  4:02 PM      Result Value  Ref Range Status   Specimen Description BLOOD RIGHT ARM   Final   Special Requests BOTTLES DRAWN AEROBIC ONLY 10CC   Final   Culture  Setup Time     Final   Value: 10/03/2013 19:49     Performed at [facility]   Culture     Final   Value:        BLOOD CULTURE RECEIVED NO GROWTH TO DATE CULTURE WILL BE HELD FOR 5 DAYS BEFORE ISSUING A FINAL NEGATIVE REPORT     Performed at [facility]   Report Status PENDING   Incomplete  CULTURE, BLOOD (ROUTINE X 2)     Status: None   Collection Time    10/03/13  4:10 PM      Result Value Ref Range Status   Specimen Description BLOOD LEFT ARM   Final   Special Requests BOTTLES DRAWN AEROBIC ONLY 10CC   Final   Culture  Setup Time     Final   Value: 10/03/2013 19:49     Performed at [facility]   Culture     Final   Value:        BLOOD CULTURE RECEIVED NO GROWTH TO DATE CULTURE WILL BE HELD FOR 5 DAYS BEFORE ISSUING A FINAL NEGATIVE REPORT     Performed at [facility]   Report Status PENDING   Incomplete  URINE CULTURE     Status: None   Collection Time    10/03/13  6:08 PM      Result Value Ref Range Status   Specimen Description URINE, CLEAN CATCH   Final   Special Requests NONE   Final   Culture  Setup Time     Final   Value: 10/03/2013 18:47     Performed at [facility]  Colony Count     Final   Value: NO GROWTH     Performed at [facility]   Culture     Final   Value: NO GROWTH     Performed at [facility]   Report Status 10/05/2013 FINAL   Final     Labs: Basic Metabolic Panel:  Recent Labs Lab 10/03/13 1001 10/03/13 1602 10/04/13 0116 10/05/13 0232  NA 141  --  140 139  K 3.5*  --  3.5* 3.7  CL 98  --  95* 96  CO2 28  --  33* 29  GLUCOSE 166*  --  137* 141*  BUN 22  --  22 26*  CREATININE 1.02 1.05 1.17 1.18  CALCIUM 9.1  --  9.1 9.2   Liver Function Tests: No results found for this basename: AST, ALT, ALKPHOS, BILITOT, PROT, ALBUMIN,  in the last 168  hours No results found for this basename: LIPASE, AMYLASE,  in the last 168 hours No results found for this basename: AMMONIA,  in the last 168 hours CBC:  Recent Labs Lab 10/03/13 1001 10/03/13 1602 10/04/13 0116  WBC 10.7* 12.6* 10.1  HGB 10.8* 10.8* 10.4*  HCT 34.7* 35.6* 33.1*  MCV 87.8 87.9 86.6  PLT 256 231 263   Cardiac Enzymes:  Recent Labs Lab 10/03/13 1001 10/03/13 1602 10/03/13 2010 10/04/13 0116  TROPONINI <0.30 <0.30 <0.30 <0.30   BNP: BNP (last 3 results)  Recent Labs  10/03/13 1001 10/05/13 0232  PROBNP 3031.0* 1639.0*   CBG:  Recent Labs Lab 10/04/13 1116 10/04/13 1622 10/04/13 2122 10/05/13 0622 10/05/13 1121  GLUCAP 187* 244* 108* 145* 195*       Signed:  Bailley Guilford K  [facility] 10/05/2013, 7:49 PM

## 2013-10-05 NOTE — Care Management Note (Addendum)
  Page 1 of 1   10/05/2013     3:15:37 PM CARE MANAGEMENT NOTE 10/05/2013  Patient:  Jimmy Dawson, Jimmy Dawson   Account Number:  [id number]  Date Initiated:  10/05/2013  Documentation initiated by:  Mariann Laster  Subjective/Objective Assessment:   Admitted with CHF and CAPneumonia     Action/Plan:   CM to follow for disposition needs   Anticipated DC Date:  10/06/2013   Anticipated DC Plan:  HOME/SELF CARE         Choice offered to / List presented to:             Status of service:  Completed, signed off Medicare Important Message given?   (If response is "NO", the following Medicare IM given date fields will be blank) Date Medicare IM given:   Medicare IM given by:   Date Additional Medicare IM given:   Additional Medicare IM given by:    Discharge Disposition:  HOME/SELF CARE  Per UR Regulation:  Reviewed for med. necessity/level of care/duration of stay  If discussed at [place] of Stay Meetings, dates discussed:    Comments:  Kimm Ungaro RN, BSN, MSHL, CCM  Nurse - Case Manager,  ([facility])  [phone]  10/05/2013 Initial IM by admissions on 10/03/2013 Social:  From home with wife PT RECS:  None Disposition Plan:  Home / Self care.

## 2013-10-09 LAB — CULTURE, BLOOD (ROUTINE X 2)
CULTURE: NO GROWTH
CULTURE: NO GROWTH
Culture: NO GROWTH
Culture: NO GROWTH

## 2013-10-19 ENCOUNTER — Other Ambulatory Visit: Payer: Federal, State, Local not specified - PPO

## 2013-10-19 DIAGNOSIS — I119 Hypertensive heart disease without heart failure: Secondary | ICD-10-CM

## 2013-10-19 DIAGNOSIS — E119 Type 2 diabetes mellitus without complications: Secondary | ICD-10-CM

## 2013-10-19 DIAGNOSIS — E1165 Type 2 diabetes mellitus with hyperglycemia: Secondary | ICD-10-CM

## 2013-10-19 DIAGNOSIS — E785 Hyperlipidemia, unspecified: Secondary | ICD-10-CM

## 2013-10-19 DIAGNOSIS — N289 Disorder of kidney and ureter, unspecified: Secondary | ICD-10-CM

## 2013-10-19 DIAGNOSIS — IMO0001 Reserved for inherently not codable concepts without codable children: Secondary | ICD-10-CM

## 2013-10-20 LAB — LIPID PANEL
CHOL/HDL RATIO: 4 ratio (ref 0.0–5.0)
Cholesterol, Total: 157 mg/dL (ref 100–199)
HDL: 39 mg/dL — ABNORMAL LOW (ref >39)
LDL CALC: 97 mg/dL (ref 0–99)
Triglycerides: 106 mg/dL (ref 0–149)
VLDL CHOLESTEROL CAL: 21 mg/dL (ref 5–40)

## 2013-10-20 LAB — BASIC METABOLIC PANEL
BUN / CREAT RATIO: 24 — AB (ref 10–22)
BUN: 34 mg/dL — AB (ref 8–27)
CHLORIDE: 95 mmol/L — AB (ref 97–108)
CO2: 25 mmol/L (ref 18–29)
Calcium: 9.6 mg/dL (ref 8.6–10.2)
Creatinine, Ser: 1.42 mg/dL — ABNORMAL HIGH (ref 0.76–1.27)
GFR calc Af Amer: 52 mL/min/{1.73_m2} — ABNORMAL LOW (ref >59)
GFR calc non Af Amer: 45 mL/min/{1.73_m2} — ABNORMAL LOW (ref >59)
Glucose: 148 mg/dL — ABNORMAL HIGH (ref 65–99)
POTASSIUM: 4.1 mmol/L (ref 3.5–5.2)
SODIUM: 138 mmol/L (ref 134–144)

## 2013-10-20 LAB — HEMOGLOBIN A1C
Est. average glucose Bld gHb Est-mCnc: 177 mg/dL
Hgb A1c MFr Bld: 7.8 % — ABNORMAL HIGH (ref 4.8–5.6)

## 2013-10-20 LAB — MICROALBUMIN / CREATININE URINE RATIO
CREATININE UR: 248.7 mg/dL (ref 22.0–328.0)
MICROALB/CREAT RATIO: 41 mg/g creat — ABNORMAL HIGH (ref 0.0–30.0)
MICROALBUM., U, RANDOM: 102 ug/mL — AB (ref 0.0–17.0)

## 2013-10-21 ENCOUNTER — Ambulatory Visit (INDEPENDENT_AMBULATORY_CARE_PROVIDER_SITE_OTHER): Payer: Federal, State, Local not specified - PPO | Admitting: Internal Medicine

## 2013-10-21 ENCOUNTER — Encounter: Payer: Self-pay | Admitting: Internal Medicine

## 2013-10-21 VITALS — HR 86 | Temp 96.7°F | Resp 10 | Ht 65.08 in | Wt 191.4 lb

## 2013-10-21 DIAGNOSIS — N289 Disorder of kidney and ureter, unspecified: Secondary | ICD-10-CM

## 2013-10-21 DIAGNOSIS — E785 Hyperlipidemia, unspecified: Secondary | ICD-10-CM

## 2013-10-21 DIAGNOSIS — R413 Other amnesia: Secondary | ICD-10-CM

## 2013-10-21 DIAGNOSIS — B379 Candidiasis, unspecified: Secondary | ICD-10-CM | POA: Insufficient documentation

## 2013-10-21 DIAGNOSIS — I5032 Chronic diastolic (congestive) heart failure: Secondary | ICD-10-CM

## 2013-10-21 DIAGNOSIS — E119 Type 2 diabetes mellitus without complications: Secondary | ICD-10-CM

## 2013-10-21 DIAGNOSIS — I2510 Atherosclerotic heart disease of native coronary artery without angina pectoris: Secondary | ICD-10-CM

## 2013-10-21 DIAGNOSIS — J189 Pneumonia, unspecified organism: Secondary | ICD-10-CM

## 2013-10-21 DIAGNOSIS — I509 Heart failure, unspecified: Secondary | ICD-10-CM

## 2013-10-21 MED ORDER — NYSTATIN DOMESTIC POWD: Status: DC

## 2013-10-21 MED ORDER — CARVEDILOL 3.125 MG PO TABS: 3.1250 mg | ORAL_TABLET | Freq: Two times a day (BID) | ORAL | Status: DC

## 2013-10-21 MED ORDER — METFORMIN HCL 500 MG PO TABS: 1000.0000 mg | ORAL_TABLET | Freq: Two times a day (BID) | ORAL | Status: DC

## 2013-10-21 MED ORDER — SIMVASTATIN 40 MG PO TABS: ORAL_TABLET | ORAL | Status: DC

## 2013-10-21 NOTE — Progress Notes (Signed)
Patient ID: Jimmy Dawson, male   DOB: 05/14/1928, 80 y.o.   MRN: [id number]    Location:    [facility]  Place of Service:  Office    No Known Allergies  Chief Complaint  Patient presents with  . Medical Management of Chronic Issues    4 month follow-up, discus labs (copy printed)   . Cough    Patient with productive cough (yellowish)    HPI:   Hospitalized 10/03/13 to 10/05/13 with acute on chronic diastolic heart failure. Diuresed about 12 pounds. His dyspnea improved. He was started on Coreg.  Chronic diastolic congestive heart failure: improved on current meds. BUN and creatinine are rising.  Coronary artery disease involving native coronary artery of native heart without angina pectoris: no angina  Type II or unspecified type diabetes mellitus without mention of complication, not stated as uncontrolled: worse A1c and fasting glucose  CAP (community acquired pneumonia): resolved  Mild memory disturbance: unchanged  Renal insufficiency: worsening  Hyperlipidemia: controlled  He has concerns that he may have chronic yeast infections. He wants to go on nystatin chronically.   Medications: Patient's Medications  New Prescriptions   No medications on file  Previous Medications   ASPIRIN 81 MG TABLET    Take 81 mg by mouth at bedtime.    CARVEDILOL (COREG) 3.125 MG TABLET    Take 1 tablet (3.125 mg total) by mouth 2 (two) times daily with a meal.   FLUTICASONE (FLONASE) 50 MCG/ACT NASAL SPRAY    Place 1 spray into both nostrils daily as needed for allergies or rhinitis.   FUROSEMIDE (LASIX) 40 MG TABLET    Take 40 mg by mouth daily as needed. Take one tablet once daily as needed to control swelling.   LOSARTAN-HYDROCHLOROTHIAZIDE (HYZAAR) 100-25 MG PER TABLET    Take one tablet by mouth once daily to control blood pressure   METFORMIN (GLUCOPHAGE) 500 MG TABLET    Take 2 tablets (1,000 mg total) by mouth 2 (two) times daily with a meal.   MULTIPLE VITAMIN (MULTIVITAMIN)  TABLET    Take 1 tablet by mouth daily.    OMEPRAZOLE (PRILOSEC) 40 MG CAPSULE    Take one capsule twice daily for acid reduction   SIMVASTATIN (ZOCOR) 40 MG TABLET    Take one tablet by mouth once daily for cholesterol  Modified Medications   No medications on file  Discontinued Medications   FLUZONE HIGH-DOSE INJECTION         Review of Systems  Constitutional: Negative.  Negative for fever, chills, diaphoresis, activity change, appetite change and fatigue.  HENT: Positive for hearing loss. Negative for congestion, ear pain, nosebleeds, rhinorrhea, sinus pressure and tinnitus.        Wears prescription lenses. Otherwise negative in regards to symptoms. Wax in both EAC and impacted in right ear.  Eyes:       Wears prescription lenses. Negative for all other symptoms.  Respiratory: Negative for cough, choking, chest tightness, shortness of breath and wheezing.   Cardiovascular: Negative for chest pain, palpitations and leg swelling.  Gastrointestinal: Negative for abdominal pain, diarrhea, constipation and blood in stool.  Endocrine: Negative for cold intolerance, heat intolerance, polydipsia, polyphagia and polyuria.  Genitourinary: Negative for dysuria, urgency, frequency, flank pain, decreased urine volume, enuresis and difficulty urinating.  Musculoskeletal: Negative for arthralgias, back pain, gait problem and myalgias.  Skin:       Itching has resolved.  Allergic/Immunologic: Negative.   Neurological: Negative for dizziness, tremors, speech difficulty,  weakness, light-headedness, numbness and headaches.       Patient says he is aware that his memory is slipping.  Hematological: Negative.   Psychiatric/Behavioral: Negative.     Filed Vitals:   10/21/13 1122  Pulse: 86  Temp: 96.7 F (35.9 C)  TempSrc: Oral  Resp: 10  Height: 5' 5.08" (1.653 m)  Weight: 191 lb 6.4 oz (86.818 kg)  SpO2: 96%   Body mass index is 31.77 kg/(m^2).  Physical Exam  Constitutional: He is  oriented to person, place, and time. He appears well-developed and well-nourished.  HENT:  Significant hearing loss bilaterally. Interferes with conversation. Wax occlusion of right EAC.  Eyes:  Wears prescription lenses, otherwise normal.  Neck: Neck supple. No JVD present. No tracheal deviation present. No thyromegaly present.  Cardiovascular: Normal rate, regular rhythm, normal heart sounds and intact distal pulses.  Exam reveals no gallop.   No murmur heard. Pulmonary/Chest: Breath sounds normal. No respiratory distress. He has no wheezes. He has no rales. He exhibits no tenderness.  Abdominal: Soft. Bowel sounds are normal. He exhibits no mass. There is no tenderness.  Musculoskeletal: Normal range of motion.  Chronnic back discomfort with movement.  Lymphadenopathy:    He has no cervical adenopathy.  Neurological: He is alert and oriented to person, place, and time. No cranial nerve deficit. Coordination normal.  Wife has reported issues with his memory in the past. He now says that he is aware of some memory slippage. He does not feel that it incapacitates him in any way.  Skin: Skin is warm and dry. No rash noted. No erythema.  Lesion on the right shoulder that may be a ringworm.  Psychiatric: He has a normal mood and affect. His behavior is normal. Judgment and thought content normal.     Labs reviewed: Lab on 10/19/2013  Component Date Value Ref Range Status  . Hemoglobin A1C 10/19/2013 7.8* 4.8 - 5.6 % Final   Comment:          Increased risk for diabetes: 5.7 - 6.4                                   Diabetes: >6.4                                   Glycemic control for adults with diabetes: <7.0  . Estimated average glucose 10/19/2013 177   Final  . Cholesterol, Total 10/19/2013 157  100 - 199 mg/dL Final  . Triglycerides 10/19/2013 106  0 - 149 mg/dL Final  . HDL 10/19/2013 39* >39 mg/dL Final   Comment: According to ATP-III Guidelines, HDL-C >59 mg/dL is considered a                           negative risk factor for CHD.  Marland Kitchen VLDL Cholesterol Cal 10/19/2013 21  5 - 40 mg/dL Final  . LDL Calculated 10/19/2013 97  0 - 99 mg/dL Final  . Chol/HDL Ratio 10/19/2013 4.0  0.0 - 5.0 ratio units Final   Comment:                                   T. Chol/HDL Ratio  Men  Women                                                        1/2 Avg.Risk  3.4    3.3                                                            Avg.Risk  5.0    4.4                                                         2X Avg.Risk  9.6    7.1                                                         3X Avg.Risk 23.4   11.0  . Creatinine, Ur 10/19/2013 248.7  22.0 - 328.0 mg/dL Final  . Microalbum.,U,Random 10/19/2013 102.0* 0.0 - 17.0 ug/mL Final  . MICROALB/CREAT RATIO 10/19/2013 41.0* 0.0 - 30.0 mg/g creat Final  . Glucose 10/19/2013 148* 65 - 99 mg/dL Final  . BUN 10/19/2013 34* 8 - 27 mg/dL Final  . Creatinine, Ser 10/19/2013 1.42* 0.76 - 1.27 mg/dL Final  . GFR calc non Af Amer 10/19/2013 45* >59 mL/min/1.73 Final  . GFR calc Af Amer 10/19/2013 52* >59 mL/min/1.73 Final  . BUN/Creatinine Ratio 10/19/2013 24* 10 - 22 Final  . Sodium 10/19/2013 138  134 - 144 mmol/L Final  . Potassium 10/19/2013 4.1  3.5 - 5.2 mmol/L Final  . Chloride 10/19/2013 95* 97 - 108 mmol/L Final  . CO2 10/19/2013 25  18 - 29 mmol/L Final  . Calcium 10/19/2013 9.6  8.6 - 10.2 mg/dL Final  Admission on 10/03/2013, Discharged on 10/05/2013  Component Date Value Ref Range Status  . Glucose-Capillary 10/03/2013 179* 70 - 99 mg/dL Final  . Troponin I 10/03/2013 <0.30  <0.30 ng/mL Final   Comment:                                 Due to the release kinetics of cTnI,                          a negative result within the first hours                          of the onset of symptoms does not rule out                          myocardial infarction with certainty.  If myocardial infarction is still suspected,                          repeat the test at appropriate intervals.  . WBC 10/03/2013 10.7* 4.0 - 10.5 K/uL Final  . RBC 10/03/2013 3.95* 4.22 - 5.81 MIL/uL Final  . Hemoglobin 10/03/2013 10.8* 13.0 - 17.0 g/dL Final  . HCT 10/03/2013 34.7* 39.0 - 52.0 % Final  . MCV 10/03/2013 87.8  78.0 - 100.0 fL Final  . MCH 10/03/2013 27.3  26.0 - 34.0 pg Final  . MCHC 10/03/2013 31.1  30.0 - 36.0 g/dL Final  . RDW 10/03/2013 14.5  11.5 - 15.5 % Final  . Platelets 10/03/2013 256  150 - 400 K/uL Final  . Pro B Natriuretic peptide (BNP) 10/03/2013 3031.0* 0 - 450 pg/mL Final  . Sodium 10/03/2013 141  137 - 147 mEq/L Final  . Potassium 10/03/2013 3.5* 3.7 - 5.3 mEq/L Final  . Chloride 10/03/2013 98  96 - 112 mEq/L Final  . CO2 10/03/2013 28  19 - 32 mEq/L Final  . Glucose, Bld 10/03/2013 166* 70 - 99 mg/dL Final  . BUN 10/03/2013 22  6 - 23 mg/dL Final  . Creatinine, Ser 10/03/2013 1.02  0.50 - 1.35 mg/dL Final  . Calcium 10/03/2013 9.1  8.4 - 10.5 mg/dL Final  . GFR calc non Af Amer 10/03/2013 66* >90 mL/min Final  . GFR calc Af Amer 10/03/2013 76* >90 mL/min Final   Comment: (NOTE)                          The eGFR has been calculated using the CKD EPI equation.                          This calculation has not been validated in all clinical situations.                          eGFR's persistently <90 mL/min signify possible Chronic Kidney                          Disease.  . Anion gap 10/03/2013 15  5 - 15 Final  . Specimen Description 10/03/2013 BLOOD RIGHT ARM   Final  . Special Requests 10/03/2013 BOTTLES DRAWN AEROBIC AND ANAEROBIC [place]   Final  . Culture  Setup Time 10/03/2013    Final                   Value:10/03/2013 18:30                         Performed at [facility]  . Culture 10/03/2013    Final                   Value:NO GROWTH 5 DAYS                         Performed at [facility]  . Report  Status 10/03/2013 10/09/2013 FINAL   Final  . Specimen Description 10/03/2013 BLOOD LEFT ARM   Final  . Special Requests 10/03/2013 BOTTLES DRAWN AEROBIC AND ANAEROBIC [place]   Final  . Culture  Setup Time 10/03/2013    Final  Value:10/03/2013 18:30                         Performed at [facility]  . Culture 10/03/2013    Final                   Value:NO GROWTH 5 DAYS                         Performed at [facility]  . Report Status 10/03/2013 10/09/2013 FINAL   Final  . Specimen Description 10/03/2013 BLOOD LEFT ARM   Final  . Special Requests 10/03/2013 BOTTLES DRAWN AEROBIC ONLY 10CC   Final  . Culture  Setup Time 10/03/2013    Final                   Value:10/03/2013 19:49                         Performed at [facility]  . Culture 10/03/2013    Final                   Value:NO GROWTH 5 DAYS                         Performed at [facility]  . Report Status 10/03/2013 10/09/2013 FINAL   Final  . Specimen Description 10/03/2013 BLOOD RIGHT ARM   Final  . Special Requests 10/03/2013 BOTTLES DRAWN AEROBIC ONLY 10CC   Final  . Culture  Setup Time 10/03/2013    Final                   Value:10/03/2013 19:49                         Performed at [facility]  . Culture 10/03/2013    Final                   Value:NO GROWTH 5 DAYS                         Performed at [facility]  . Report Status 10/03/2013 10/09/2013 FINAL   Final  . Color, Urine 10/03/2013 YELLOW  YELLOW Final  . APPearance 10/03/2013 CLEAR  CLEAR Final  . Specific Gravity, Urine 10/03/2013 1.008  1.005 - 1.030 Final  . pH 10/03/2013 7.5  5.0 - 8.0 Final  . Glucose, UA 10/03/2013 NEGATIVE  NEGATIVE mg/dL Final  . Hgb urine dipstick 10/03/2013 NEGATIVE  NEGATIVE Final  . Bilirubin Urine 10/03/2013 NEGATIVE  NEGATIVE Final  . Ketones, ur 10/03/2013 NEGATIVE  NEGATIVE mg/dL Final  . Protein, ur 10/03/2013 NEGATIVE  NEGATIVE mg/dL Final  . Urobilinogen, UA  10/03/2013 0.2  0.0 - 1.0 mg/dL Final  . Nitrite 10/03/2013 NEGATIVE  NEGATIVE Final  . Leukocytes, UA 10/03/2013 NEGATIVE  NEGATIVE Final   MICROSCOPIC NOT DONE ON URINES WITH NEGATIVE PROTEIN, BLOOD, LEUKOCYTES, NITRITE, OR GLUCOSE <1000 mg/dL.  Marland Kitchen Specimen Description 10/03/2013 URINE, CLEAN CATCH   Final  . Special Requests 10/03/2013 NONE   Final  . Culture  Setup Time 10/03/2013    Final                   Value:10/03/2013 18:47  Performed at [facility]  . [place] Count 10/03/2013    Final                   Value:NO GROWTH                         Performed at [facility]  . Culture 10/03/2013    Final                   Value:NO GROWTH                         Performed at [facility]  . Report Status 10/03/2013 10/05/2013 FINAL   Final  . Specimen Description 10/03/2013 URINE, CLEAN CATCH   Final  . Special Requests 10/03/2013 NONE   Final  . Legionella Antigen, Urine 10/03/2013    Final                   Value:Negative for Legionella pneumophilia serogroup 1                         Performed at [facility]  . Report Status 10/03/2013 10/04/2013 FINAL   Final  . Strep Pneumo Urinary Antigen 10/03/2013 NEGATIVE  NEGATIVE Final   Comment:                                 Infection due to S. pneumoniae                          cannot be absolutely ruled out                          since the antigen present                          may be below the detection limit                          of the test.  . WBC 10/03/2013 12.6* 4.0 - 10.5 K/uL Final  . RBC 10/03/2013 4.05* 4.22 - 5.81 MIL/uL Final  . Hemoglobin 10/03/2013 10.8* 13.0 - 17.0 g/dL Final  . HCT 10/03/2013 35.6* 39.0 - 52.0 % Final  . MCV 10/03/2013 87.9  78.0 - 100.0 fL Final  . MCH 10/03/2013 26.7  26.0 - 34.0 pg Final  . MCHC 10/03/2013 30.3  30.0 - 36.0 g/dL Final  . RDW 10/03/2013 14.7  11.5 - 15.5 % Final  . Platelets 10/03/2013 231  150 - 400 K/uL Final  .  Creatinine, Ser 10/03/2013 1.05  0.50 - 1.35 mg/dL Final  . GFR calc non Af Amer 10/03/2013 64* >90 mL/min Final  . GFR calc Af Amer 10/03/2013 74* >90 mL/min Final   Comment: (NOTE)                          The eGFR has been calculated using the CKD EPI equation.                          This calculation has not been validated in all clinical situations.  eGFR's persistently <90 mL/min signify possible Chronic Kidney                          Disease.  . Troponin I 10/03/2013 <0.30  <0.30 ng/mL Final   Comment:                                 Due to the release kinetics of cTnI,                          a negative result within the first hours                          of the onset of symptoms does not rule out                          myocardial infarction with certainty.                          If myocardial infarction is still suspected,                          repeat the test at appropriate intervals.  . Troponin I 10/03/2013 <0.30  <0.30 ng/mL Final   Comment:                                 Due to the release kinetics of cTnI,                          a negative result within the first hours                          of the onset of symptoms does not rule out                          myocardial infarction with certainty.                          If myocardial infarction is still suspected,                          repeat the test at appropriate intervals.  . Troponin I 10/04/2013 <0.30  <0.30 ng/mL Final   Comment:                                 Due to the release kinetics of cTnI,                          a negative result within the first hours                          of the onset of symptoms does not rule out                          myocardial infarction with certainty.  If myocardial infarction is still suspected,                          repeat the test at appropriate intervals.  . Hemoglobin A1C 10/03/2013 7.3* <5.7 %  Final   Comment: (NOTE)                                                                                                                         According to the ADA Clinical Practice Recommendations for 2011, when                          HbA1c is used as a screening test:                           >=6.5%   Diagnostic of Diabetes Mellitus                                    (if abnormal result is confirmed)                          5.7-6.4%   Increased risk of developing Diabetes Mellitus                          References:Diagnosis and Classification of Diabetes Mellitus,Diabetes                          ZLDJ,5701,77(LTJQZ 1):S62-S69 and Standards of Medical Care in                                  Diabetes - 2011,Diabetes ESPQ,3300,76 (Suppl 1):S11-S61.  . Mean Plasma Glucose 10/03/2013 163* <117 mg/dL Final   Performed at [facility]  . Glucose-Capillary 10/03/2013 127* 70 - 99 mg/dL Final  . Comment 1 10/03/2013 Notify RN   Final  . Sodium 10/04/2013 140  137 - 147 mEq/L Final  . Potassium 10/04/2013 3.5* 3.7 - 5.3 mEq/L Final  . Chloride 10/04/2013 95* 96 - 112 mEq/L Final  . CO2 10/04/2013 33* 19 - 32 mEq/L Final  . Glucose, Bld 10/04/2013 137* 70 - 99 mg/dL Final  . BUN 10/04/2013 22  6 - 23 mg/dL Final  . Creatinine, Ser 10/04/2013 1.17  0.50 - 1.35 mg/dL Final  . Calcium 10/04/2013 9.1  8.4 - 10.5 mg/dL Final  . GFR calc non Af Amer 10/04/2013 56* >90 mL/min Final  . GFR calc Af Amer 10/04/2013 65* >90 mL/min Final   Comment: (NOTE)                          The eGFR  has been calculated using the CKD EPI equation.                          This calculation has not been validated in all clinical situations.                          eGFR's persistently <90 mL/min signify possible Chronic Kidney                          Disease.  . Anion gap 10/04/2013 12  5 - 15 Final  . WBC 10/04/2013 10.1  4.0 - 10.5 K/uL Final  . RBC 10/04/2013 3.82* 4.22 - 5.81 MIL/uL Final  .  Hemoglobin 10/04/2013 10.4* 13.0 - 17.0 g/dL Final  . HCT 10/04/2013 33.1* 39.0 - 52.0 % Final  . MCV 10/04/2013 86.6  78.0 - 100.0 fL Final  . MCH 10/04/2013 27.2  26.0 - 34.0 pg Final  . MCHC 10/04/2013 31.4  30.0 - 36.0 g/dL Final  . RDW 10/04/2013 14.6  11.5 - 15.5 % Final  . Platelets 10/04/2013 263  150 - 400 K/uL Final  . Glucose-Capillary 10/03/2013 146* 70 - 99 mg/dL Final  . Glucose-Capillary 10/04/2013 153* 70 - 99 mg/dL Final  . Glucose-Capillary 10/04/2013 187* 70 - 99 mg/dL Final  . Comment 1 10/04/2013 Notify RN   Final  . Glucose-Capillary 10/04/2013 244* 70 - 99 mg/dL Final  . Sodium 10/05/2013 139  137 - 147 mEq/L Final  . Potassium 10/05/2013 3.7  3.7 - 5.3 mEq/L Final  . Chloride 10/05/2013 96  96 - 112 mEq/L Final  . CO2 10/05/2013 29  19 - 32 mEq/L Final  . Glucose, Bld 10/05/2013 141* 70 - 99 mg/dL Final  . BUN 10/05/2013 26* 6 - 23 mg/dL Final  . Creatinine, Ser 10/05/2013 1.18  0.50 - 1.35 mg/dL Final  . Calcium 10/05/2013 9.2  8.4 - 10.5 mg/dL Final  . GFR calc non Af Amer 10/05/2013 55* >90 mL/min Final  . GFR calc Af Amer 10/05/2013 64* >90 mL/min Final   Comment: (NOTE)                          The eGFR has been calculated using the CKD EPI equation.                          This calculation has not been validated in all clinical situations.                          eGFR's persistently <90 mL/min signify possible Chronic Kidney                          Disease.  . Anion gap 10/05/2013 14  5 - 15 Final  . Glucose-Capillary 10/04/2013 108* 70 - 99 mg/dL Final  . Pro B Natriuretic peptide (BNP) 10/05/2013 1639.0* 0 - 450 pg/mL Final  . Glucose-Capillary 10/05/2013 145* 70 - 99 mg/dL Final  . Glucose-Capillary 10/05/2013 195* 70 - 99 mg/dL Final      Assessment/Plan  1. Chronic diastolic congestive heart failure Continue current medications - carvedilol (COREG) 3.125 MG tablet; Take 1 tablet (3.125 mg total) by mouth 2 (two) times daily with a meal.   Dispense: 60  tablet; Refill: 0 - Brain natriuretic peptide; Future  2. Coronary artery disease involving native coronary artery of native heart without angina pectoris stable  3. Type II or unspecified type diabetes mellitus without mention of complication, not stated as uncontrolled Follow diet. Recent elevation in A1c may have been related to acute intercurrent illness. - metFORMIN (GLUCOPHAGE) 500 MG tablet; Take 2 tablets (1,000 mg total) by mouth 2 (two) times daily with a meal.  Dispense: 360 tablet; Refill: 3 - Hemoglobin A1c; Future - Comprehensive metabolic panel; Future  4. CAP (community acquired pneumonia) resolved  5. Mild memory disturbance unchanged  6. Renal insufficiency Rising BUN and creatinine likely related to diuresis at hospital  7. Hyperlipidemia controlled - simvastatin (ZOCOR) 40 MG tablet; Take one tablet by mouth once daily for cholesterol  Dispense: 90 tablet; Refill: 3 - Lipid panel; Future  8. Monilia infection I advised him of my skepticism that he has a chronic yeast infection, but that I was willing to order the nystatin at his request - Nystatin Domestic POWD; 1/2 tsp in 6 oz water or juice daily for yeast infection  Dispense: 631 each; Refill: 1

## 2013-10-21 NOTE — Patient Instructions (Signed)
Prescriptions were sent to [facility]: metformin, simvastatin, Coreg, and nystatin.

## 2013-10-22 ENCOUNTER — Other Ambulatory Visit: Payer: Self-pay | Admitting: *Deleted

## 2013-10-22 DIAGNOSIS — B379 Candidiasis, unspecified: Secondary | ICD-10-CM

## 2013-10-22 DIAGNOSIS — E785 Hyperlipidemia, unspecified: Secondary | ICD-10-CM

## 2013-10-22 DIAGNOSIS — E119 Type 2 diabetes mellitus without complications: Secondary | ICD-10-CM

## 2013-10-22 MED ORDER — LOSARTAN POTASSIUM-HCTZ 100-25 MG PO TABS: ORAL_TABLET | ORAL | Status: DC

## 2013-10-22 MED ORDER — SIMVASTATIN 40 MG PO TABS: ORAL_TABLET | ORAL | Status: DC

## 2013-10-22 MED ORDER — METFORMIN HCL 500 MG PO TABS: 1000.0000 mg | ORAL_TABLET | Freq: Two times a day (BID) | ORAL | Status: DC

## 2013-10-22 MED ORDER — NYSTATIN DOMESTIC POWD: Status: DC

## 2013-10-22 NOTE — Telephone Encounter (Signed)
Wife called and stated that pharmacy didn't receive the Nystatin Powder. Faxed to pharmacy

## 2013-10-23 ENCOUNTER — Other Ambulatory Visit: Payer: Self-pay | Admitting: *Deleted

## 2013-10-23 DIAGNOSIS — I5032 Chronic diastolic (congestive) heart failure: Secondary | ICD-10-CM

## 2013-10-23 MED ORDER — CARVEDILOL 3.125 MG PO TABS: 3.1250 mg | ORAL_TABLET | Freq: Two times a day (BID) | ORAL | Status: DC

## 2013-10-26 ENCOUNTER — Telehealth: Payer: Self-pay

## 2013-10-26 NOTE — Telephone Encounter (Signed)
Jimmy Dawson from [facility] mail order the Rx Dr. Nyoka Cowden wrote for Nystatin power can not be taken by mouth. There is another one that can but the would need the dose and amount. [facility] [phone], ref # [id number]

## 2013-10-28 ENCOUNTER — Other Ambulatory Visit: Payer: Self-pay | Admitting: Internal Medicine

## 2013-10-28 DIAGNOSIS — B379 Candidiasis, unspecified: Secondary | ICD-10-CM

## 2013-10-28 MED ORDER — NYSTATIN PO POWD: ORAL | Status: DC

## 2013-10-28 NOTE — Telephone Encounter (Signed)
Discussed with Phil at [facility]. New order was given and entered in this pt's record.

## 2013-11-13 ENCOUNTER — Other Ambulatory Visit: Payer: Self-pay | Admitting: *Deleted

## 2013-11-13 DIAGNOSIS — E785 Hyperlipidemia, unspecified: Secondary | ICD-10-CM

## 2013-11-13 MED ORDER — SIMVASTATIN 40 MG PO TABS: ORAL_TABLET | ORAL | Status: DC

## 2013-11-13 NOTE — Telephone Encounter (Signed)
Patient requested Rx to be faxed to [place]

## 2014-01-14 ENCOUNTER — Other Ambulatory Visit: Payer: Federal, State, Local not specified - PPO

## 2014-01-14 ENCOUNTER — Ambulatory Visit (INDEPENDENT_AMBULATORY_CARE_PROVIDER_SITE_OTHER): Payer: Federal, State, Local not specified - PPO

## 2014-01-14 DIAGNOSIS — I5032 Chronic diastolic (congestive) heart failure: Secondary | ICD-10-CM

## 2014-01-14 DIAGNOSIS — E119 Type 2 diabetes mellitus without complications: Secondary | ICD-10-CM

## 2014-01-14 DIAGNOSIS — E785 Hyperlipidemia, unspecified: Secondary | ICD-10-CM

## 2014-01-14 DIAGNOSIS — Z23 Encounter for immunization: Secondary | ICD-10-CM

## 2014-01-15 LAB — LIPID PANEL
CHOL/HDL RATIO: 3.2 ratio (ref 0.0–5.0)
Cholesterol, Total: 133 mg/dL (ref 100–199)
HDL: 41 mg/dL (ref >39)
LDL Calculated: 72 mg/dL (ref 0–99)
Triglycerides: 100 mg/dL (ref 0–149)
VLDL Cholesterol Cal: 20 mg/dL (ref 5–40)

## 2014-01-15 LAB — COMPREHENSIVE METABOLIC PANEL
A/G RATIO: 1.7 (ref 1.1–2.5)
ALT: 16 IU/L (ref 0–44)
AST: 20 IU/L (ref 0–40)
Albumin: 4.5 g/dL (ref 3.5–4.7)
Alkaline Phosphatase: 68 IU/L (ref 39–117)
BILIRUBIN TOTAL: 0.7 mg/dL (ref 0.0–1.2)
BUN / CREAT RATIO: 19 (ref 10–22)
BUN: 25 mg/dL (ref 8–27)
CO2: 28 mmol/L (ref 18–29)
Calcium: 9.9 mg/dL (ref 8.6–10.2)
Chloride: 95 mmol/L — ABNORMAL LOW (ref 97–108)
Creatinine, Ser: 1.33 mg/dL — ABNORMAL HIGH (ref 0.76–1.27)
GFR calc Af Amer: 57 mL/min/{1.73_m2} — ABNORMAL LOW (ref >59)
GFR calc non Af Amer: 49 mL/min/{1.73_m2} — ABNORMAL LOW (ref >59)
Globulin, Total: 2.7 g/dL (ref 1.5–4.5)
Glucose: 134 mg/dL — ABNORMAL HIGH (ref 65–99)
POTASSIUM: 4.4 mmol/L (ref 3.5–5.2)
SODIUM: 139 mmol/L (ref 134–144)
Total Protein: 7.2 g/dL (ref 6.0–8.5)

## 2014-01-15 LAB — HEMOGLOBIN A1C
Est. average glucose Bld gHb Est-mCnc: 180 mg/dL
Hgb A1c MFr Bld: 7.9 % — ABNORMAL HIGH (ref 4.8–5.6)

## 2014-01-15 LAB — BRAIN NATRIURETIC PEPTIDE: BNP: 480.1 pg/mL — AB (ref 0.0–100.0)

## 2014-01-20 ENCOUNTER — Ambulatory Visit (INDEPENDENT_AMBULATORY_CARE_PROVIDER_SITE_OTHER): Payer: Federal, State, Local not specified - PPO | Admitting: Internal Medicine

## 2014-01-20 ENCOUNTER — Encounter: Payer: Self-pay | Admitting: Internal Medicine

## 2014-01-20 VITALS — BP 115/80 | HR 82 | Temp 97.8°F | Ht 65.08 in | Wt 196.8 lb

## 2014-01-20 DIAGNOSIS — N289 Disorder of kidney and ureter, unspecified: Secondary | ICD-10-CM

## 2014-01-20 DIAGNOSIS — IMO0002 Reserved for concepts with insufficient information to code with codable children: Secondary | ICD-10-CM

## 2014-01-20 DIAGNOSIS — E1165 Type 2 diabetes mellitus with hyperglycemia: Secondary | ICD-10-CM

## 2014-01-20 DIAGNOSIS — E785 Hyperlipidemia, unspecified: Secondary | ICD-10-CM

## 2014-01-20 DIAGNOSIS — I5032 Chronic diastolic (congestive) heart failure: Secondary | ICD-10-CM

## 2014-01-20 DIAGNOSIS — E1129 Type 2 diabetes mellitus with other diabetic kidney complication: Secondary | ICD-10-CM

## 2014-01-20 MED ORDER — GLIMEPIRIDE 2 MG PO TABS: ORAL_TABLET | ORAL | Status: DC

## 2014-01-20 NOTE — Progress Notes (Signed)
Patient ID: Jimmy Dawson, male   DOB: December 15, 1928, 80 y.o.   MRN: [id number]    Facility  [facility]    Place of Service:   OFFICE   No Known Allergies  Chief Complaint  Patient presents with  . Medical Management of Chronic Issues    3 month follow up    HPI:   Hospitalized 10/03/13 to 10/05/13 with pneumonia and acute respiratory failure. Complicated by acute respiratory failure and acute diastolic CHF.  Complains of allergy and teary eyes. Coughing.  DM (diabetes mellitus), type 2, uncontrolled, with renal complications: J6B still high at 7.9  Renal insufficiency: stable  Hyperlipidemia: cxontrolled  Chronic diastolic congestive heart failure: recent episode of dyspnea. Improved after he quit drinking so much water.   Medications: Patient's Medications  New Prescriptions   No medications on file  Previous Medications   ASPIRIN 81 MG TABLET    Take 81 mg by mouth at bedtime.    CARVEDILOL (COREG) 3.125 MG TABLET    Take 1 tablet (3.125 mg total) by mouth 2 (two) times daily with a meal.   FUROSEMIDE (LASIX) 40 MG TABLET    Take 40 mg by mouth daily as needed. Take one tablet once daily as needed to control swelling.   LOSARTAN-HYDROCHLOROTHIAZIDE (HYZAAR) 100-25 MG PER TABLET    Take one tablet by mouth once daily to control blood pressure   METFORMIN (GLUCOPHAGE) 500 MG TABLET    Take 2 tablets (1,000 mg total) by mouth 2 (two) times daily with a meal.   MULTIPLE VITAMIN (MULTIVITAMIN) TABLET    Take 1 tablet by mouth daily.    NYSTATIN POWD    50,000,000 units per 14 tsp bottle.   Take 1/8 tsp mixed in 6 oz. Water daily to treat chronic yeast infection   OMEPRAZOLE (PRILOSEC) 40 MG CAPSULE    Take one capsule twice daily for acid reduction   SIMVASTATIN (ZOCOR) 40 MG TABLET    Take one tablet by mouth once daily for cholesterol  Modified Medications   No medications on file  Discontinued Medications   FLUTICASONE (FLONASE) 50 MCG/ACT NASAL SPRAY    Place 1 spray into  both nostrils daily as needed for allergies or rhinitis.     Review of Systems  Constitutional: Negative.  Negative for fever, chills, diaphoresis, activity change, appetite change and fatigue.  HENT: Positive for hearing loss. Negative for congestion, ear pain, nosebleeds, rhinorrhea, sinus pressure and tinnitus.        Wears prescription lenses. Otherwise negative in regards to symptoms. Wax in both EAC and impacted in right ear.  Eyes:       Wears prescription lenses. Negative for all other symptoms.  Respiratory: Negative for cough, choking, chest tightness, shortness of breath and wheezing.   Cardiovascular: Negative for chest pain, palpitations and leg swelling.  Gastrointestinal: Negative for abdominal pain, diarrhea, constipation and blood in stool.  Endocrine: Negative for cold intolerance, heat intolerance, polydipsia, polyphagia and polyuria.  Genitourinary: Negative for dysuria, urgency, frequency, flank pain, decreased urine volume, enuresis and difficulty urinating.  Musculoskeletal: Negative for arthralgias, back pain, gait problem and myalgias.  Skin:       Itching has resolved.  Allergic/Immunologic: Negative.   Neurological: Negative for dizziness, tremors, speech difficulty, weakness, light-headedness, numbness and headaches.       Patient says he is aware that his memory is slipping.  Hematological: Negative.   Psychiatric/Behavioral: Negative.     Filed Vitals:   01/20/14 1433  BP: 115/80  Pulse: 82  Temp: 97.8 F (36.6 C)  TempSrc: Oral  Height: 5' 5.08" (1.653 m)  Weight: 196 lb 12.8 oz (89.268 kg)  SpO2: 98%   Body mass index is 32.67 kg/(m^2).  Physical Exam  Constitutional: He is oriented to person, place, and time. He appears well-developed and well-nourished.  HENT:  Significant hearing loss bilaterally. Interferes with conversation. Wax occlusion of right EAC.  Eyes:  Wears prescription lenses, otherwise normal.  Neck: Neck supple. No JVD  present. No tracheal deviation present. No thyromegaly present.  Cardiovascular: Normal rate, regular rhythm, normal heart sounds and intact distal pulses.  Exam reveals no gallop.   No murmur heard. Pulmonary/Chest: Breath sounds normal. No respiratory distress. He has no wheezes. He has no rales. He exhibits no tenderness.  Abdominal: Soft. Bowel sounds are normal. He exhibits no mass. There is no tenderness.  Musculoskeletal: Normal range of motion.  Chronnic back discomfort with movement.  Lymphadenopathy:    He has no cervical adenopathy.  Neurological: He is alert and oriented to person, place, and time. No cranial nerve deficit. Coordination normal.  Wife has reported issues with his memory in the past. He now says that he is aware of some memory slippage. He does not feel that it incapacitates him in any way.  Skin: Skin is warm and dry. No rash noted. No erythema.  Lesion on the right shoulder that may be a ringworm.  Psychiatric: He has a normal mood and affect. His behavior is normal. Judgment and thought content normal.     Labs reviewed: Appointment on 01/14/2014  Component Date Value Ref Range Status  . Hemoglobin A1C 01/14/2014 7.9* 4.8 - 5.6 % Final   Comment:          Increased risk for diabetes: 5.7 - 6.4                                   Diabetes: >6.4                                   Glycemic control for adults with diabetes: <7.0  . Estimated average glucose 01/14/2014 180   Final  . Glucose 01/14/2014 134* 65 - 99 mg/dL Final  . BUN 01/14/2014 25  8 - 27 mg/dL Final  . Creatinine, Ser 01/14/2014 1.33* 0.76 - 1.27 mg/dL Final  . GFR calc non Af Amer 01/14/2014 49* >59 mL/min/1.73 Final  . GFR calc Af Amer 01/14/2014 57* >59 mL/min/1.73 Final  . BUN/Creatinine Ratio 01/14/2014 19  10 - 22 Final  . Sodium 01/14/2014 139  134 - 144 mmol/L Final  . Potassium 01/14/2014 4.4  3.5 - 5.2 mmol/L Final  . Chloride 01/14/2014 95* 97 - 108 mmol/L Final  . CO2 01/14/2014 28   18 - 29 mmol/L Final  . Calcium 01/14/2014 9.9  8.6 - 10.2 mg/dL Final  . Total Protein 01/14/2014 7.2  6.0 - 8.5 g/dL Final  . Albumin 01/14/2014 4.5  3.5 - 4.7 g/dL Final  . Globulin, Total 01/14/2014 2.7  1.5 - 4.5 g/dL Final  . Albumin/Globulin Ratio 01/14/2014 1.7  1.1 - 2.5 Final  . Total Bilirubin 01/14/2014 0.7  0.0 - 1.2 mg/dL Final  . Alkaline Phosphatase 01/14/2014 68  39 - 117 IU/L Final  . AST 01/14/2014 20  0 - 40 IU/L  Final  . ALT 01/14/2014 16  0 - 44 IU/L Final  . Cholesterol, Total 01/14/2014 133  100 - 199 mg/dL Final   Comment: **Effective January 25, 2014 the reference interval**                            for Cholesterol, Total will be changing to:                                                   0 - 19 years       100 - 169                                                      >19 years       100 - 199  . Triglycerides 01/14/2014 100  0 - 149 mg/dL Final   Comment: **Effective January 25, 2014 the reference interval**                            for Triglycerides will be changing to:                                                   0 -  9 years         0 -  74                                                  10 - 19 years         0 -  89                                                      >19 years         0 - 149  . HDL 01/14/2014 41  >39 mg/dL Final   Comment: According to ATP-III Guidelines, HDL-C >59 mg/dL is considered a                          negative risk factor for CHD.  Marland Kitchen VLDL Cholesterol Cal 01/14/2014 20  5 - 40 mg/dL Final  . LDL Calculated 01/14/2014 72  0 - 99 mg/dL Final   Comment: **Effective January 25, 2014 the reference interval**                            for LDL Cholesterol Calc will be changing to:  0 - 19 years         0 - 109                                                      >19 years         0 - 99  . Chol/HDL Ratio 01/14/2014 3.2  0.0 - 5.0 ratio units Final   Comment:                                    T. Chol/HDL Ratio                                                                      Men  Women                                                        1/2 Avg.Risk  3.4    3.3                                                            Avg.Risk  5.0    4.4                                                         2X Avg.Risk  9.6    7.1                                                         3X Avg.Risk 23.4   11.0  . BNP 01/14/2014 480.1* 0.0 - 100.0 pg/mL Final     Assessment/Plan 1. DM (diabetes mellitus), type 2, uncontrolled, with renal complications Continue metformin - Hemoglobin A1c; Future - Basic metabolic panel; Future - Microalbumin, urine; Future - add glimepiride (AMARYL) 2 MG tablet; One daily with breakfast to control diabetes  Dispense: 30 tablet; Refill: 5  2. Renal insufficiency - Basic metabolic panel; Future  3. Hyperlipidemia - Lipid panel; Future  4. Chronic diastolic congestive heart failure - Brain natriuretic peptide; Future

## 2014-04-05 ENCOUNTER — Ambulatory Visit (INDEPENDENT_AMBULATORY_CARE_PROVIDER_SITE_OTHER): Payer: Medicare Other | Admitting: Nurse Practitioner

## 2014-04-05 ENCOUNTER — Encounter: Payer: Self-pay | Admitting: Nurse Practitioner

## 2014-04-05 VITALS — BP 144/88 | HR 84 | Temp 97.7°F | Wt 205.0 lb

## 2014-04-05 DIAGNOSIS — N289 Disorder of kidney and ureter, unspecified: Secondary | ICD-10-CM

## 2014-04-05 DIAGNOSIS — E1165 Type 2 diabetes mellitus with hyperglycemia: Secondary | ICD-10-CM

## 2014-04-05 DIAGNOSIS — J9601 Acute respiratory failure with hypoxia: Secondary | ICD-10-CM

## 2014-04-05 DIAGNOSIS — I5032 Chronic diastolic (congestive) heart failure: Secondary | ICD-10-CM

## 2014-04-05 DIAGNOSIS — IMO0002 Reserved for concepts with insufficient information to code with codable children: Secondary | ICD-10-CM

## 2014-04-05 DIAGNOSIS — E1129 Type 2 diabetes mellitus with other diabetic kidney complication: Secondary | ICD-10-CM

## 2014-04-05 MED ORDER — IPRATROPIUM BROMIDE 0.02 % IN SOLN: 0.5000 mg | Freq: Once | RESPIRATORY_TRACT | Status: DC

## 2014-04-05 MED ORDER — POTASSIUM CHLORIDE CRYS ER 20 MEQ PO TBCR: EXTENDED_RELEASE_TABLET | ORAL | Status: DC

## 2014-04-05 MED ORDER — IPRATROPIUM-ALBUTEROL 20-100 MCG/ACT IN AERS: 1.0000 | INHALATION_SPRAY | Freq: Once | RESPIRATORY_TRACT | Status: DC

## 2014-04-05 MED ORDER — AMOXICILLIN-POT CLAVULANATE 875-125 MG PO TABS: 1.0000 | ORAL_TABLET | Freq: Two times a day (BID) | ORAL | Status: DC

## 2014-04-05 MED ORDER — ALBUTEROL SULFATE HFA 108 (90 BASE) MCG/ACT IN AERS: 2.0000 | INHALATION_SPRAY | Freq: Four times a day (QID) | RESPIRATORY_TRACT | Status: DC | PRN

## 2014-04-05 NOTE — Progress Notes (Signed)
Patient ID: Jimmy Dawson, male   DOB: 1928/08/26, 81 y.o.   MRN: [id number]    PCP: Estill Dooms, MD  No Known Allergies  Chief Complaint  Patient presents with  . Cough    for months, coughs phlegm up then cough better.  In hospital in 10/03/13 with Acute Respiratory failure.  . Shortness of Breath    when he lays down     HPI: Patient is a 81 y.o. male seen in the office today for evaluation of cough. Reports he has had a cough for 6 months. Cough has gotten worse over the last few days, worsening shortness of breath as well. Coughing up white to off white colored thick sputum. Pt states his wife says its infection.  Has had worsening swelling in LE, starting taking lasix every other day due to this, feels like it has helped some. Has not taken today.  No fever Feels well. Reports he is not short of breath now despite increase RR during visit Having to prop himself up more to sleep at night.  Review of Systems:  Review of Systems  Constitutional: Negative for activity change, appetite change, fatigue and unexpected weight change.  HENT: Negative for congestion and hearing loss.   Eyes: Negative.   Respiratory: Positive for cough, shortness of breath and wheezing.   Cardiovascular: Positive for leg swelling. Negative for chest pain and palpitations.  Gastrointestinal: Negative for abdominal pain, diarrhea and constipation.  Genitourinary: Negative for dysuria and difficulty urinating.  Musculoskeletal: Negative for myalgias and arthralgias.  Skin: Negative for color change and wound.  Neurological: Negative for dizziness and weakness.  Psychiatric/Behavioral: Negative for behavioral problems, confusion and agitation.    Past Medical History  Diagnosis Date  . Type II or unspecified type diabetes mellitus without mention of complication, uncontrolled   . Hyperlipidemia   . Hypertensive heart disease   . Unspecified disorder of kidney and ureter   . Diastasis of  muscle   . Elevated prostate specific antigen (PSA)   . GERD (gastroesophageal reflux disease)   . CAD (coronary artery disease)   . Aortic valve disease    Past Surgical History  Procedure Laterality Date  . Finger surgery  1955    RIGHT INDEX AND RIGHT MIDDLE FINGER  . Sp intro uret cath perc  03-1999  . Coronary angioplasty with stent placement  05-2000   Social History:   reports that he quit smoking about 46 years ago. He has never used smokeless tobacco. He reports that he does not drink alcohol or use illicit drugs.  Family History  Problem Relation Age of Onset  . Heart disease Mother   . Diabetes Mother   . Heart disease Father   . Heart attack Father   . Heart disease Sister   . Diabetes Sister   . Heart disease Brother   . Diabetes Brother   . Diabetes Brother   . Heart disease Brother   . Diabetes Brother   . Heart disease Brother     Medications: Patient's Medications  New Prescriptions   No medications on file  Previous Medications   ASPIRIN 81 MG TABLET    Take 81 mg by mouth at bedtime.    CARVEDILOL (COREG) 3.125 MG TABLET    Take 1 tablet (3.125 mg total) by mouth 2 (two) times daily with a meal.   FUROSEMIDE (LASIX) 40 MG TABLET    Take 40 mg by mouth daily as needed. Take one tablet once  daily as needed to control swelling.   GLIMEPIRIDE (AMARYL) 2 MG TABLET    One daily with breakfast to control diabetes   LOSARTAN-HYDROCHLOROTHIAZIDE (HYZAAR) 100-25 MG PER TABLET    Take one tablet by mouth once daily to control blood pressure   METFORMIN (GLUCOPHAGE) 500 MG TABLET    Take 2 tablets (1,000 mg total) by mouth 2 (two) times daily with a meal.   MULTIPLE VITAMIN (MULTIVITAMIN) TABLET    Take 1 tablet by mouth daily.    OMEPRAZOLE (PRILOSEC) 40 MG CAPSULE    Take one capsule twice daily for acid reduction   SIMVASTATIN (ZOCOR) 40 MG TABLET    Take one tablet by mouth once daily for cholesterol  Modified Medications   No medications on file    Discontinued Medications   NYSTATIN POWD    50,000,000 units per 14 tsp bottle.   Take 1/8 tsp mixed in 6 oz. Water daily to treat chronic yeast infection     Physical Exam:  Filed Vitals:   04/05/14 1619  BP: 144/88  Pulse: 84  Temp: 97.7 F (36.5 C)  TempSrc: Oral  Weight: 205 lb (92.987 kg)  SpO2: 90%    Physical Exam  Constitutional: He appears well-developed and well-nourished. No distress.  HENT:  Head: Normocephalic and atraumatic.  Mouth/Throat: Oropharynx is clear and moist. No oropharyngeal exudate.  Eyes: Conjunctivae and EOM are normal. Pupils are equal, round, and reactive to light.  Neck: Normal range of motion. Neck supple.  Cardiovascular: Normal rate, regular rhythm and normal heart sounds.   Pulmonary/Chest: Effort normal. Tachypnea noted. He has wheezes. He has rales.  Abdominal: Soft. Bowel sounds are normal.  Musculoskeletal: He exhibits edema (2+). He exhibits no tenderness.  Neurological: He is alert.  Skin: Skin is warm and dry. He is not diaphoretic.  Psychiatric: He has a normal mood and affect.    Labs reviewed: Basic Metabolic Panel:  Recent Labs  10/05/13 0232 10/19/13 1002 01/14/14 0856  NA 139 138 139  K 3.7 4.1 4.4  CL 96 95* 95*  CO2 29 25 28   GLUCOSE 141* 148* 134*  BUN 26* 34* 25  CREATININE 1.18 1.42* 1.33*  CALCIUM 9.2 9.6 9.9   Liver Function Tests:  Recent Labs  05/22/13 0834 01/14/14 0856  AST 20 20  ALT 15 16  ALKPHOS 69 68  BILITOT 0.7 0.7  PROT 7.1 7.2   No results for input(s): LIPASE, AMYLASE in the last 8760 hours. No results for input(s): AMMONIA in the last 8760 hours. CBC:  Recent Labs  10/03/13 1001 10/03/13 1602 10/04/13 0116  WBC 10.7* 12.6* 10.1  HGB 10.8* 10.8* 10.4*  HCT 34.7* 35.6* 33.1*  MCV 87.8 87.9 86.6  PLT 256 231 263   Lipid Panel:  Recent Labs  05/22/13 0834 10/19/13 0935 01/14/14 0856  HDL 42 39* 41  LDLCALC 78 97 72  TRIG 99 106 100  CHOLHDL 3.3 4.0 3.2    TSH: No results for input(s): TSH in the last 8760 hours. A1C: Lab Results  Component Value Date   HGBA1C 7.9* 01/14/2014     Assessment/Plan 1. Chronic diastolic congestive heart failure -worse, pt with sats of 85% and RR of 30 by mid-visit, duoneb given with good results, sats improved to 92%  will get BNP today - albuterol (PROVENTIL HFA;VENTOLIN HFA) 108 (90 BASE) MCG/ACT inhaler; Inhale 2 puffs into the lungs every 6 (six) hours as needed for wheezing or shortness of breath.  Dispense: 1  Inhaler; Refill: 0 -will have pt take lasix 40 mg 2 tablets tonight with potassium 46meq, 2 tablets today Then to start lasix 40 mg daily and potassium 20 meq daily  - potassium chloride SA (K-DUR,KLOR-CON) 20 MEQ tablet; 1 tablet by mouth daily,(to take with lasix)  Dispense: 30 tablet; Refill: 3 - DG Chest 2 View  2. DM (diabetes mellitus), type 2, uncontrolled, with renal complications - Hemoglobin A1c is due, collected today  3. Renal insufficiency - Basic metabolic panel  4. Acute respiratory failure with hypoxia -in addition to albuterol, lasix with potassium will start augmentin and obtain chest xray - amoxicillin-clavulanate (AUGMENTIN) 875-125 MG per tablet; Take 1 tablet by mouth 2 (two) times daily.  Dispense: 20 tablet; Refill: 0 - DG Chest 2 View  Directions given to friend, patient and wife called. Informed if pt gets worse to seek emergency care immediately, wife and pt understand.

## 2014-04-05 NOTE — Patient Instructions (Addendum)
To take lasix 40 mg 2 tablets TODAY when you get home To take Potassium 20 meq 2 tablets TODAY when you get home Tomorrow start lasix 40 mg 1 tablet daily with potassium 20 meq 1 tablet daily    To start Augmentin 875-125 mg tablet twice daily for 1 week-- to first dose today  Albuterol inhaler every 6 hours as needed for shortness of breath  Will need to get chest xray  Follow up in 1 week If you come more short of breath, fevers, chill, increased confusion, changes in mental status SEEK emergency treatment immediately

## 2014-04-06 ENCOUNTER — Ambulatory Visit
Admission: RE | Admit: 2014-04-06 | Discharge: 2014-04-06 | Disposition: A | Discharge status: Home / Self Care | Payer: Medicare Other | Source: Ambulatory Visit | Attending: Nurse Practitioner | Admitting: Nurse Practitioner

## 2014-04-06 LAB — BASIC METABOLIC PANEL
BUN / CREAT RATIO: 24 — AB (ref 10–22)
BUN: 33 mg/dL — ABNORMAL HIGH (ref 8–27)
CO2: 29 mmol/L (ref 18–29)
CREATININE: 1.39 mg/dL — AB (ref 0.76–1.27)
Calcium: 9.2 mg/dL (ref 8.6–10.2)
Chloride: 101 mmol/L (ref 97–108)
GFR calc Af Amer: 54 mL/min/{1.73_m2} — ABNORMAL LOW (ref >59)
GFR, EST NON AFRICAN AMERICAN: 47 mL/min/{1.73_m2} — AB (ref >59)
Glucose: 98 mg/dL (ref 65–99)
POTASSIUM: 3.7 mmol/L (ref 3.5–5.2)
SODIUM: 145 mmol/L — AB (ref 134–144)

## 2014-04-06 LAB — HEMOGLOBIN A1C
Est. average glucose Bld gHb Est-mCnc: 154 mg/dL
HEMOGLOBIN A1C: 7 % — AB (ref 4.8–5.6)

## 2014-04-06 LAB — BRAIN NATRIURETIC PEPTIDE: BNP: 752.2 pg/mL — AB (ref 0.0–100.0)

## 2014-04-09 ENCOUNTER — Ambulatory Visit: Payer: Medicare Other | Admitting: Internal Medicine

## 2014-04-12 ENCOUNTER — Encounter: Payer: Self-pay | Admitting: Nurse Practitioner

## 2014-04-12 ENCOUNTER — Ambulatory Visit (INDEPENDENT_AMBULATORY_CARE_PROVIDER_SITE_OTHER): Payer: Medicare Other | Admitting: Nurse Practitioner

## 2014-04-12 VITALS — BP 140/82 | HR 88 | Temp 97.6°F | Wt 196.0 lb

## 2014-04-12 DIAGNOSIS — J181 Lobar pneumonia, unspecified organism: Secondary | ICD-10-CM

## 2014-04-12 DIAGNOSIS — I5031 Acute diastolic (congestive) heart failure: Secondary | ICD-10-CM

## 2014-04-12 DIAGNOSIS — J189 Pneumonia, unspecified organism: Secondary | ICD-10-CM

## 2014-04-12 MED ORDER — FUROSEMIDE 20 MG PO TABS: 20.0000 mg | ORAL_TABLET | Freq: Every day | ORAL | Status: DC

## 2014-04-12 NOTE — Progress Notes (Signed)
Patient ID: Jimmy Dawson, male   DOB: December 14, 1928, 81 y.o.   MRN: [id number]    PCP: Estill Dooms, MD  No Known Allergies  Chief Complaint  Patient presents with  . Medical Management of Chronic Issues    one week follow-up pneumonia. "Better, not coughing as much".      HPI: Patient is a 81 y.o. male seen in the office today for follow up pneumonia and CHF. Pt was seen last week due to severe cough and shortness of breath. Chest xray done which revealed pneumonia and opacity at the right base, with interstitial opacities in the suprahilar regions bilaterally.  Pt was started on Augmentin BID and lasix 40 mg  with potassium 68meq daily. Also given albuterol inhaler (which pt reports he did not get much benefit from this) Reports he feels well. Cough is better and sleeping flat at night. No fevers or chills. No diarrhea, N/V. Eating well.  Lost 10 lbs since last visit. Swelling is gone.  Follows up with Cardiologist tomorrow. Dr Wynonia Lawman  Review of Systems:  Review of Systems  Constitutional: Negative for activity change, appetite change, fatigue and unexpected weight change.  HENT: Negative for congestion and hearing loss.   Eyes: Negative.   Respiratory: Positive for cough. Negative for shortness of breath and wheezing.   Cardiovascular: Negative for chest pain, palpitations and leg swelling (improved).  Gastrointestinal: Negative for abdominal pain, diarrhea and constipation.  Genitourinary: Negative for dysuria and difficulty urinating.  Musculoskeletal: Negative for myalgias and arthralgias.  Skin: Negative for color change and wound.  Neurological: Negative for dizziness and weakness.  Psychiatric/Behavioral: Negative for behavioral problems, confusion and agitation.    Past Medical History  Diagnosis Date  . Type II or unspecified type diabetes mellitus without mention of complication, uncontrolled   . Hyperlipidemia   . Hypertensive heart disease   . Unspecified  disorder of kidney and ureter   . Diastasis of muscle   . Elevated prostate specific antigen (PSA)   . GERD (gastroesophageal reflux disease)   . CAD (coronary artery disease)   . Aortic valve disease    Past Surgical History  Procedure Laterality Date  . Finger surgery  1955    RIGHT INDEX AND RIGHT MIDDLE FINGER  . Sp intro uret cath perc  03-1999  . Coronary angioplasty with stent placement  05-2000   Social History:   reports that he quit smoking about 46 years ago. He has never used smokeless tobacco. He reports that he does not drink alcohol or use illicit drugs.  Family History  Problem Relation Age of Onset  . Heart disease Mother   . Diabetes Mother   . Heart disease Father   . Heart attack Father   . Heart disease Sister   . Diabetes Sister   . Heart disease Brother   . Diabetes Brother   . Diabetes Brother   . Heart disease Brother   . Diabetes Brother   . Heart disease Brother     Medications: Patient's Medications  New Prescriptions   No medications on file  Previous Medications   ALBUTEROL (PROVENTIL HFA;VENTOLIN HFA) 108 (90 BASE) MCG/ACT INHALER    Inhale 2 puffs into the lungs every 6 (six) hours as needed for wheezing or shortness of breath.   AMOXICILLIN-CLAVULANATE (AUGMENTIN) 875-125 MG PER TABLET    Take 1 tablet by mouth 2 (two) times daily.   ASPIRIN 81 MG TABLET    Take 81 mg by mouth at  bedtime.    CARVEDILOL (COREG) 3.125 MG TABLET    Take 1 tablet (3.125 mg total) by mouth 2 (two) times daily with a meal.   FUROSEMIDE (LASIX) 40 MG TABLET    Take 40 mg by mouth daily as needed. Take one tablet once daily as needed to control swelling.   GLIMEPIRIDE (AMARYL) 2 MG TABLET    One daily with breakfast to control diabetes   LOSARTAN-HYDROCHLOROTHIAZIDE (HYZAAR) 100-25 MG PER TABLET    Take one tablet by mouth once daily to control blood pressure   METFORMIN (GLUCOPHAGE) 500 MG TABLET    Take 2 tablets (1,000 mg total) by mouth 2 (two) times daily  with a meal.   MULTIPLE VITAMIN (MULTIVITAMIN) TABLET    Take 1 tablet by mouth daily.    OMEPRAZOLE (PRILOSEC) 40 MG CAPSULE    Take one capsule twice daily for acid reduction   POTASSIUM CHLORIDE SA (K-DUR,KLOR-CON) 20 MEQ TABLET    1 tablet by mouth daily,(to take with lasix)   SIMVASTATIN (ZOCOR) 40 MG TABLET    Take one tablet by mouth once daily for cholesterol  Modified Medications   No medications on file  Discontinued Medications   No medications on file     Physical Exam:  Filed Vitals:   04/12/14 1420  BP: 140/82  Pulse: 88  Temp: 97.6 F (36.4 C)  TempSrc: Oral  Weight: 196 lb (88.905 kg)  SpO2: 92%    Physical Exam  Constitutional: He appears well-developed and well-nourished. No distress.  HENT:  Head: Normocephalic and atraumatic.  Mouth/Throat: Oropharynx is clear and moist. No oropharyngeal exudate.  Eyes: Conjunctivae and EOM are normal. Pupils are equal, round, and reactive to light.  Neck: Normal range of motion. Neck supple.  Cardiovascular: Normal rate, regular rhythm and normal heart sounds.   Pulmonary/Chest: Effort normal. No tachypnea. No respiratory distress. He has no wheezes. He has rhonchi (right middle lobe). He has no rales.  Abdominal: Soft. Bowel sounds are normal.  Musculoskeletal: He exhibits no edema or tenderness.  Neurological: He is alert.  Skin: Skin is warm and dry. He is not diaphoretic.  Psychiatric: He has a normal mood and affect.    Labs reviewed: Basic Metabolic Panel:  Recent Labs  10/19/13 1002 01/14/14 0856 04/05/14 1728  NA 138 139 145*  K 4.1 4.4 3.7  CL 95* 95* 101  CO2 25 28 29   GLUCOSE 148* 134* 98  BUN 34* 25 33*  CREATININE 1.42* 1.33* 1.39*  CALCIUM 9.6 9.9 9.2   Liver Function Tests:  Recent Labs  05/22/13 0834 01/14/14 0856  AST 20 20  ALT 15 16  ALKPHOS 69 68  BILITOT 0.7 0.7  PROT 7.1 7.2   No results for input(s): LIPASE, AMYLASE in the last 8760 hours. No results for input(s):  AMMONIA in the last 8760 hours. CBC:  Recent Labs  10/03/13 1001 10/03/13 1602 10/04/13 0116  WBC 10.7* 12.6* 10.1  HGB 10.8* 10.8* 10.4*  HCT 34.7* 35.6* 33.1*  MCV 87.8 87.9 86.6  PLT 256 231 263   Lipid Panel:  Recent Labs  05/22/13 0834 10/19/13 0935 01/14/14 0856  HDL 42 39* 41  LDLCALC 78 97 72  TRIG 99 106 100  CHOLHDL 3.3 4.0 3.2   TSH: No results for input(s): TSH in the last 8760 hours. A1C: Lab Results  Component Value Date   HGBA1C 7.0* 04/05/2014     Assessment/Plan 1. Acute diastolic congestive heart failure -improved with 9  lb weight loss from last week with lasix 40 mg daily and potassium. Edema and breathing has improved. Will keep pt on lasix 20 mg daily without potassium for now due to renal insufficiency.  - furosemide (LASIX) 20 MG tablet; Take 1 tablet (20 mg total) by mouth daily.  Dispense: 30 tablet; Refill: 1 - Basic metabolic panel today and follow up  Basic metabolic panel; Future on 04/23/13 - pt to follow up with Cardiologist, reports he is seeing Dr Wynonia Lawman on 04/13/14 for his yearly exam.   2. Right middle lobe pneumonia -has improved, to cont Augmentin and to complete 10 day supply  - DG Chest 2 View; Future (to follow up pneumonia on 04/23/14)  To keep follow up with Dr Nyoka Cowden on 04/27/14 Cc: Dr Wynonia Lawman

## 2014-04-12 NOTE — Patient Instructions (Signed)
Make sure you are taking Augmentin twice daily (1 tablet in the am and 1 tablet in the pm) for a total of 10 days.   Take lasix 20 mg daily for your heart-- NO potassium at this time   Follow up in 2 weeks for blood work only   You  need to follow up with your cardiologist

## 2014-04-13 ENCOUNTER — Encounter: Payer: Self-pay | Admitting: Cardiology

## 2014-04-13 DIAGNOSIS — I5022 Chronic systolic (congestive) heart failure: Secondary | ICD-10-CM | POA: Insufficient documentation

## 2014-04-13 LAB — BASIC METABOLIC PANEL
BUN/Creatinine Ratio: 18 (ref 10–22)
BUN: 22 mg/dL (ref 8–27)
CO2: 30 mmol/L — AB (ref 18–29)
CREATININE: 1.2 mg/dL (ref 0.76–1.27)
Calcium: 9.9 mg/dL (ref 8.6–10.2)
Chloride: 95 mmol/L — ABNORMAL LOW (ref 97–108)
GFR calc Af Amer: 64 mL/min/{1.73_m2} (ref >59)
GFR, EST NON AFRICAN AMERICAN: 56 mL/min/{1.73_m2} — AB (ref >59)
Glucose: 73 mg/dL (ref 65–99)
POTASSIUM: 4.3 mmol/L (ref 3.5–5.2)
Sodium: 142 mmol/L (ref 134–144)

## 2014-04-13 NOTE — Progress Notes (Unsigned)
Patient ID: Jimmy Dawson, male   DOB: 03-12-1929, 81 y.o.   MRN: [id number]   Jimmy Dawson, Jimmy Dawson    Date of visit:  04/13/2014 DOB:  Aug 02, 1928    Age:  81 yrs. Medical record number:  [id number]     Account number:  [id number] Primary Care Provider: GREEN III, ARTHUR G ____________________________ CURRENT DIAGNOSES  1. Atherosclerotic heart disease of native coronary artery without angina pectoris  2. Nonrheumatic aortic valve disorder, unspecified  3. Chronic systolic heart failure  4. Dyspnea  5. Hyperlipidemia, unspecified  6. Hypertensive heart disease without heart failure  7. Left bundle-branch block, unspecified  8. Type 2 diabetes mellitus without complications  9. Presence of coronary angioplasty implant and graft  10. Gastro-esophageal reflux disease without esophagitis ____________________________ ALLERGIES  No Known Allergies ____________________________ MEDICATIONS  1. omeprazole 40 mg Capsule, Delayed Release(E.C.), 1 p.o. daily  2. multivitamin Tablet, 1 p.o. daily  3. simvastatin 40 mg tablet, 1 p.o. daily  4. Metamucil 3.4 gram/12 gram Powder, qd  5. aspirin 81 mg chewable tablet, 1 p.o. daily  6. metformin 500 mg tablet, 2 p.o. b.i.d.  7. losartan 100 mg-hydrochlorothiazide 25 mg tablet, 1 p.o. daily  8. glimepiride 2 mg tablet, 1 p.o. daily  9. carvedilol 3.125 mg tablet, BID  10. furosemide 20 mg tablet, 1 p.o. daily ____________________________ CHIEF COMPLAINTS  Followup of Atherosclerotic heart disease of native coronary artery without angina pectoris  Followup of Chronic systolic heart failure ____________________________ HISTORY OF PRESENT ILLNESS Patient seen for cardiac evaluation. Since he was here last year he had a hospitalization this summer for respiratory failure. He was diagnosed with pneumonia and told he had diastolic heart failure but on review of the chart he was noted as having an ejection fraction of 35-40%. He was discharged on some diuretics  as well as treatment for pneumonia. About a couple weeks ago he developed worsening cough and was told he had pneumonia and also had fluid retention and was placed on diuretics. He comes in today just for a regular followup. He has lost about 10 pounds of weight according to the chart. He has had no chest discomfort suggestive of angina. He is not having edema. He has no PND or orthopnea. He does have a significant cough. He is in a left bundle branch block on EKG today and has previously had difficulty with bundle branch block. Evidently carvedilol was added to his regimen recently. ____________________________ PAST HISTORY  Past Medical Illnesses:  hypertension, DM-non-insulin dependent, hyperlipidemia, obesity, GERD, BPH, TIA;  Cardiovascular Illnesses:  CAD, aortic sclerosis;  Surgical Procedures:  TURP;  NYHA Classification:  II;  Canadian Angina Classification:  Class 0: Asymptomatic;  Cardiology Procedures-Invasive:  cardiac cath (left) February 2002, Penta stent February 2002 Dr. Glade Lloyd;  Cardiology Procedures-Noninvasive:  treadmill cardiolite December 2009, echocardiogram July 2011, echocardiogram July 2013, echocardiogram July 2015;  Cardiac Cath Results:  normal Left main, occluded mid LAD, 95% stenosis proximal Diag 1, scattered irregularities CFX, scattered irregularities RCA, stent placed mid LAD;  LVEF of 40% documented via echocardiogram on 10/04/2013,   ____________________________ CARDIO-PULMONARY TEST DATES EKG Date:  04/13/2014;   Cardiac Cath Date:  05/08/2000;  Stent Placement Date: 05/08/2000;  Nuclear Study Date:  03/03/2008;  Echocardiography Date: 10/04/2013;  Chest Xray Date: 06/27/2007;   ____________________________ FAMILY HISTORY Brother -- Brother dead, Myocardial infarction Brother -- Brother dead, Unknown disease Brother -- Brother dead, Unknown disease Father -- Father dead, Myocardial infarction Mother -- Mother dead, Diabetes mellitus, Coronary Artery  Disease ____________________________ SOCIAL HISTORY Alcohol Use:  does not use alcohol;  Smoking:  nonsmoker;  Diet:  regular diet;  Lifestyle:  widower and remarried 12 years;  Exercise:  no regular exercise;  Occupation:  [employer]-retired;  Residence:  lives with wife;   ____________________________ REVIEW OF SYSTEMS General:  malaise and fatigue Eyes: decreased acuity O.D. Respiratory: see HPI Cardiovascular:  please review HPI Abdominal: denies dyspepsia, GI bleeding, constipation, or diarrhea Genitourinary-Male: frequency, nocturia  Musculoskeletal:  chronic low back pain Neurological:  denies headaches, stroke, or TIA  ____________________________ PHYSICAL EXAMINATION VITAL SIGNS  Blood Pressure:  128/70 Sitting, Right arm, regular cuff  , 132/74 Standing, Right arm and regular cuff   Pulse:  98/min. Weight:  195.00 lbs. Height:  69"BMI: 29  Constitutional:  pleasant white male in no acute distress, mildly obese Skin:  warm and dry to touch, no apparent skin lesions, or masses noted. Head:  normocephalic, normal hair pattern, no masses or tenderness Eyes:  strabismus ENT:  ears, nose and throat reveal no gross abnormalities.  Dentition good. Neck:  supple, without massess. No JVD, thyromegaly or carotid bruits. Carotid upstroke normal. Chest:  normal symmetry, clear to auscultation and percussion. Cardiac:  regular rhythm, normal S1 and S2, no murmur, grade 1/6 systolic murmur at aortic area radiating to neck Abdomen:  abdomen soft,non-tender, no masses, no hepatospenomegaly, or aneurysm noted Peripheral Pulses:  femoral pulses 2+, posterior tibial pulses 3+ Extremities & Back:  mild bilateral venous insufficiency changes present, no edema present Neurological:  no gross motor or sensory deficits noted, affect appropriate, oriented x3. ____________________________ MOST RECENT LIPID PANEL 10/19/13  CHOL TOTL 157 mg/dl, LDL 97 NM, HDL 39 mg/dl, TRIGLYCER 106 mg/dl and CHOL/HDL 4.0  (Calc) ____________________________ IMPRESSIONS/PLAN  1. Recent dyspnea with weight gain that may be due to congestive heart failure plus pneumonia 2. Chronic systolic heart failure with ejection fraction of 35-40% which represents a reduction from previous years 3. Left bundle branch block 4. History of coronary artery disease with previous LAD stenting many years ago 5. Diabetes mellitus  Recommendations:  I recommended a repeat echocardiogram to see if his ejection fraction is still depressed. I also will need to do a myocardial perfusion scan on him since he is left bundle branch block to be sure he does not have any ischemia that would account for the depression in his EF. Continue his current medical treatment. ____________________________ TODAYS ORDERS  1. 12 Lead EKG: Today  2. Lexiscan 1 day: First Available  3. 2D, color flow, doppler: First Available                       ____________________________ Cardiology Physician:  Kerry Hough MD [facility]

## 2014-04-14 ENCOUNTER — Encounter: Payer: Self-pay | Admitting: Cardiology

## 2014-04-22 ENCOUNTER — Institutional Professional Consult (permissible substitution): Payer: Medicare Other | Admitting: Pulmonary Disease

## 2014-04-23 ENCOUNTER — Institutional Professional Consult (permissible substitution): Payer: Medicare Other | Admitting: Pulmonary Disease

## 2014-04-23 ENCOUNTER — Other Ambulatory Visit: Payer: Federal, State, Local not specified - PPO

## 2014-04-26 ENCOUNTER — Encounter: Payer: Self-pay | Admitting: *Deleted

## 2014-04-26 ENCOUNTER — Ambulatory Visit
Admission: RE | Admit: 2014-04-26 | Discharge: 2014-04-26 | Disposition: A | Discharge status: Home / Self Care | Payer: Medicare Other | Source: Ambulatory Visit | Attending: Nurse Practitioner | Admitting: Nurse Practitioner

## 2014-04-26 ENCOUNTER — Other Ambulatory Visit: Payer: Medicare Other

## 2014-04-26 DIAGNOSIS — IMO0002 Reserved for concepts with insufficient information to code with codable children: Secondary | ICD-10-CM

## 2014-04-26 DIAGNOSIS — J189 Pneumonia, unspecified organism: Secondary | ICD-10-CM

## 2014-04-26 DIAGNOSIS — E785 Hyperlipidemia, unspecified: Secondary | ICD-10-CM

## 2014-04-26 DIAGNOSIS — E1129 Type 2 diabetes mellitus with other diabetic kidney complication: Secondary | ICD-10-CM

## 2014-04-26 DIAGNOSIS — I5031 Acute diastolic (congestive) heart failure: Secondary | ICD-10-CM

## 2014-04-26 DIAGNOSIS — J181 Lobar pneumonia, unspecified organism: Principal | ICD-10-CM

## 2014-04-26 DIAGNOSIS — E1165 Type 2 diabetes mellitus with hyperglycemia: Secondary | ICD-10-CM

## 2014-04-27 ENCOUNTER — Encounter: Payer: Self-pay | Admitting: Internal Medicine

## 2014-04-27 ENCOUNTER — Ambulatory Visit (INDEPENDENT_AMBULATORY_CARE_PROVIDER_SITE_OTHER): Payer: Medicare Other | Admitting: Internal Medicine

## 2014-04-27 VITALS — BP 110/70 | HR 85 | Temp 98.0°F | Resp 18 | Ht 65.0 in | Wt 196.0 lb

## 2014-04-27 DIAGNOSIS — IMO0002 Reserved for concepts with insufficient information to code with codable children: Secondary | ICD-10-CM

## 2014-04-27 DIAGNOSIS — N183 Chronic kidney disease, stage 3 unspecified: Secondary | ICD-10-CM

## 2014-04-27 DIAGNOSIS — I5032 Chronic diastolic (congestive) heart failure: Secondary | ICD-10-CM

## 2014-04-27 DIAGNOSIS — E1165 Type 2 diabetes mellitus with hyperglycemia: Secondary | ICD-10-CM

## 2014-04-27 DIAGNOSIS — E1129 Type 2 diabetes mellitus with other diabetic kidney complication: Secondary | ICD-10-CM

## 2014-04-27 DIAGNOSIS — E785 Hyperlipidemia, unspecified: Secondary | ICD-10-CM

## 2014-04-27 DIAGNOSIS — R0600 Dyspnea, unspecified: Secondary | ICD-10-CM

## 2014-04-27 DIAGNOSIS — J189 Pneumonia, unspecified organism: Secondary | ICD-10-CM

## 2014-04-27 LAB — MICROALBUMIN, URINE: MICROALBUM., U, RANDOM: 199.2 ug/mL — AB (ref 0.0–17.0)

## 2014-04-27 LAB — LIPID PANEL
Chol/HDL Ratio: 3.7 ratio units (ref 0.0–5.0)
Cholesterol, Total: 146 mg/dL (ref 100–199)
HDL: 40 mg/dL (ref >39)
LDL Calculated: 84 mg/dL (ref 0–99)
Triglycerides: 108 mg/dL (ref 0–149)
VLDL CHOLESTEROL CAL: 22 mg/dL (ref 5–40)

## 2014-04-27 LAB — BASIC METABOLIC PANEL
BUN/Creatinine Ratio: 14 (ref 10–22)
BUN: 16 mg/dL (ref 8–27)
CALCIUM: 9.6 mg/dL (ref 8.6–10.2)
CHLORIDE: 95 mmol/L — AB (ref 97–108)
CO2: 26 mmol/L (ref 18–29)
CREATININE: 1.13 mg/dL (ref 0.76–1.27)
GFR calc Af Amer: 69 mL/min/{1.73_m2} (ref >59)
GFR calc non Af Amer: 60 mL/min/{1.73_m2} (ref >59)
Glucose: 134 mg/dL — ABNORMAL HIGH (ref 65–99)
Potassium: 4 mmol/L (ref 3.5–5.2)
Sodium: 141 mmol/L (ref 134–144)

## 2014-04-27 NOTE — Progress Notes (Signed)
Patient ID: Jimmy Dawson, male   DOB: December 28, 1928, 81 y.o.   MRN: [id number]    Facility  [facility]    Place of Service:   OFFICE   No Known Allergies  Chief Complaint  Patient presents with  . Medical Management of Chronic Issues    HPI:  Pneumonia, organism unspecified  DM (diabetes mellitus), type 2, uncontrolled, with renal complications - controlled  Stage III chronic kidney disease: improved on recent lab  Hyperlipidemia: controlled  Dyspnea - mild    Medications: Patient's Medications  New Prescriptions   No medications on file  Previous Medications   ASPIRIN 81 MG TABLET    Take 81 mg by mouth at bedtime.    CARVEDILOL (COREG) 3.125 MG TABLET    Take 1 tablet (3.125 mg total) by mouth 2 (two) times daily with a meal.   FUROSEMIDE (LASIX) 20 MG TABLET    Take 1 tablet (20 mg total) by mouth daily.   GLIMEPIRIDE (AMARYL) 2 MG TABLET    One daily with breakfast to control diabetes   LOSARTAN-HYDROCHLOROTHIAZIDE (HYZAAR) 100-25 MG PER TABLET    Take one tablet by mouth once daily to control blood pressure   METFORMIN (GLUCOPHAGE) 500 MG TABLET    Take 2 tablets (1,000 mg total) by mouth 2 (two) times daily with a meal.   MULTIPLE VITAMIN (MULTIVITAMIN) TABLET    Take 1 tablet by mouth daily.    OMEPRAZOLE (PRILOSEC) 40 MG CAPSULE    Take one capsule twice daily for acid reduction   SIMVASTATIN (ZOCOR) 40 MG TABLET    Take one tablet by mouth once daily for cholesterol  Modified Medications   No medications on file  Discontinued Medications   ALBUTEROL (PROVENTIL HFA;VENTOLIN HFA) 108 (90 BASE) MCG/ACT INHALER    Inhale 2 puffs into the lungs every 6 (six) hours as needed for wheezing or shortness of breath.   AMOXICILLIN-CLAVULANATE (AUGMENTIN) 875-125 MG PER TABLET    Take 1 tablet by mouth 2 (two) times daily.     Review of Systems  Constitutional: Negative.  Negative for fever, chills, diaphoresis, activity change, appetite change and fatigue.  HENT: Positive  for hearing loss. Negative for congestion, ear pain, nosebleeds, rhinorrhea, sinus pressure and tinnitus.        Wears prescription lenses. Otherwise negative in regards to symptoms. Wax in both EAC and impacted in right ear.  Eyes:       Wears prescription lenses. Negative for all other symptoms.  Respiratory: Negative for cough, choking, chest tightness, shortness of breath and wheezing.   Cardiovascular: Negative for chest pain, palpitations and leg swelling.  Gastrointestinal: Negative for abdominal pain, diarrhea, constipation and blood in stool.  Endocrine: Negative for cold intolerance, heat intolerance, polydipsia, polyphagia and polyuria.  Genitourinary: Negative for dysuria, urgency, frequency, flank pain, decreased urine volume, enuresis and difficulty urinating.  Musculoskeletal: Negative for myalgias, back pain, arthralgias and gait problem.  Skin:       Itching has resolved.  Allergic/Immunologic: Negative.   Neurological: Negative for dizziness, tremors, speech difficulty, weakness, light-headedness, numbness and headaches.       Patient says he is aware that his memory is slipping.  Hematological: Negative.   Psychiatric/Behavioral: Negative.     Filed Vitals:   04/27/14 1618  BP: 110/70  Pulse: 85  Temp: 98 F (36.7 C)  TempSrc: Oral  Resp: 18  Height: 5\' 5"  (1.651 m)  Weight: 196 lb (88.905 kg)  SpO2: 95%   Body  mass index is 32.62 kg/(m^2).  Physical Exam  Constitutional: He is oriented to person, place, and time. He appears well-developed and well-nourished.  HENT:  Significant hearing loss bilaterally. Interferes with conversation.  Eyes:  Wears prescription lenses, otherwise normal.  Neck: Neck supple. No JVD present. No tracheal deviation present. No thyromegaly present.  Cardiovascular: Normal rate, regular rhythm, normal heart sounds and intact distal pulses.  Exam reveals no gallop.   No murmur heard. Pulmonary/Chest: Breath sounds normal. No  respiratory distress. He has no wheezes. He has no rales. He exhibits no tenderness.  Abdominal: Soft. Bowel sounds are normal. He exhibits no mass. There is no tenderness.  Musculoskeletal: Normal range of motion.  Chronic back discomfort with movement.  Lymphadenopathy:    He has no cervical adenopathy.  Neurological: He is alert and oriented to person, place, and time. No cranial nerve deficit. Coordination normal.  Wife has reported issues with his memory in the past. He now says that he is aware of some memory slippage. He does not feel that it incapacitates him in any way.  Skin: Skin is warm and dry. No rash noted. No erythema.  Lesion on the right shoulder that may be a ringworm.  Psychiatric: He has a normal mood and affect. His behavior is normal. Judgment and thought content normal.     Labs reviewed: Appointment on 04/26/2014  Component Date Value Ref Range Status  . Cholesterol, Total 04/26/2014 146  100 - 199 mg/dL Final  . Triglycerides 04/26/2014 108  0 - 149 mg/dL Final  . HDL 04/26/2014 40  >39 mg/dL Final   Comment: According to ATP-III Guidelines, HDL-C >59 mg/dL is considered a negative risk factor for CHD.   Marland Kitchen VLDL Cholesterol Cal 04/26/2014 22  5 - 40 mg/dL Final  . LDL Calculated 04/26/2014 84  0 - 99 mg/dL Final  . Chol/HDL Ratio 04/26/2014 3.7  0.0 - 5.0 ratio units Final   Comment:                                   T. Chol/HDL Ratio                                             Men  Women                               1/2 Avg.Risk  3.4    3.3                                   Avg.Risk  5.0    4.4                                2X Avg.Risk  9.6    7.1                                3X Avg.Risk 23.4   11.0   . Microalbum.,U,Random 04/26/2014 199.2* 0.0 - 17.0 ug/mL Final  . Glucose 04/26/2014 134* 65 - 99 mg/dL Final  . BUN 04/26/2014 16  8 - 27 mg/dL Final  . Creatinine, Ser 04/26/2014 1.13  0.76 - 1.27 mg/dL Final  . GFR calc non Af Amer 04/26/2014 60   >59 mL/min/1.73 Final  . GFR calc Af Amer 04/26/2014 69  >59 mL/min/1.73 Final  . BUN/Creatinine Ratio 04/26/2014 14  10 - 22 Final  . Sodium 04/26/2014 141  134 - 144 mmol/L Final  . Potassium 04/26/2014 4.0  3.5 - 5.2 mmol/L Final  . Chloride 04/26/2014 95* 97 - 108 mmol/L Final  . CO2 04/26/2014 26  18 - 29 mmol/L Final  . Calcium 04/26/2014 9.6  8.6 - 10.2 mg/dL Final  Office Visit on 04/12/2014  Component Date Value Ref Range Status  . Glucose 04/12/2014 73  65 - 99 mg/dL Final  . BUN 04/12/2014 22  8 - 27 mg/dL Final  . Creatinine, Ser 04/12/2014 1.20  0.76 - 1.27 mg/dL Final  . GFR calc non Af Amer 04/12/2014 56* >59 mL/min/1.73 Final  . GFR calc Af Amer 04/12/2014 64  >59 mL/min/1.73 Final  . BUN/Creatinine Ratio 04/12/2014 18  10 - 22 Final  . Sodium 04/12/2014 142  134 - 144 mmol/L Final  . Potassium 04/12/2014 4.3  3.5 - 5.2 mmol/L Final  . Chloride 04/12/2014 95* 97 - 108 mmol/L Final  . CO2 04/12/2014 30* 18 - 29 mmol/L Final  . Calcium 04/12/2014 9.9  8.6 - 10.2 mg/dL Final  Office Visit on 04/05/2014  Component Date Value Ref Range Status  . Hgb A1c MFr Bld 04/05/2014 7.0* 4.8 - 5.6 % Final   Comment:          Pre-diabetes: 5.7 - 6.4          Diabetes: >6.4          Glycemic control for adults with diabetes: <7.0   . Est. average glucose Bld gHb Est-m* 04/05/2014 154   Final  . Glucose 04/05/2014 98  65 - 99 mg/dL Final  . BUN 04/05/2014 33* 8 - 27 mg/dL Final  . Creatinine, Ser 04/05/2014 1.39* 0.76 - 1.27 mg/dL Final  . GFR calc non Af Amer 04/05/2014 47* >59 mL/min/1.73 Final  . GFR calc Af Amer 04/05/2014 54* >59 mL/min/1.73 Final  . BUN/Creatinine Ratio 04/05/2014 24* 10 - 22 Final  . Sodium 04/05/2014 145* 134 - 144 mmol/L Final  . Potassium 04/05/2014 3.7  3.5 - 5.2 mmol/L Final  . Chloride 04/05/2014 101  97 - 108 mmol/L Final  . CO2 04/05/2014 29  18 - 29 mmol/L Final  . Calcium 04/05/2014 9.2  8.6 - 10.2 mg/dL Final  . BNP 04/05/2014 752.2* 0.0 - 100.0  pg/mL Final     Assessment/Plan  1. Pneumonia, organism unspecified resolved  2. DM (diabetes mellitus), type 2, uncontrolled, with renal complications controlled - Hemoglobin A1c; Future - Basic metabolic panel; Future - Microalbumin, urine; Future  3. Chronic diastolic congestive heart failure Compensated. history of 35-40% LVEF. - Brain natriuretic peptide; Future  4. Stage III chronic kidney disease improved  5. Hyperlipidemia controlled  6. Dyspnea Mild. May be related to CHF.  Note prior low LVEF. - Brain natriuretic peptide; Future

## 2014-04-30 ENCOUNTER — Other Ambulatory Visit: Payer: Self-pay | Admitting: *Deleted

## 2014-04-30 DIAGNOSIS — E1129 Type 2 diabetes mellitus with other diabetic kidney complication: Secondary | ICD-10-CM

## 2014-04-30 DIAGNOSIS — IMO0002 Reserved for concepts with insufficient information to code with codable children: Secondary | ICD-10-CM

## 2014-04-30 DIAGNOSIS — E1165 Type 2 diabetes mellitus with hyperglycemia: Principal | ICD-10-CM

## 2014-04-30 MED ORDER — GLIMEPIRIDE 2 MG PO TABS: ORAL_TABLET | ORAL | Status: DC

## 2014-04-30 NOTE — Telephone Encounter (Signed)
[facility] 

## 2014-05-03 HISTORY — PX: CARDIAC CATHETERIZATION: SHX172

## 2014-05-04 ENCOUNTER — Encounter: Payer: Self-pay | Admitting: Internal Medicine

## 2014-05-14 NOTE — H&P (Signed)
Jimmy Dawson    Date of visit:  05/14/2014 DOB:  [birth year]    Age:  81 yrs. Medical record number:  [id number]     Account number:  [id number] Primary Care Provider: GREEN III, ARTHUR G ____________________________ CURRENT DIAGNOSES  1. Atherosclerotic heart disease of native coronary artery without angina pectoris  2. Nonrheumatic aortic valve disorder, unspecified  3. Chronic systolic heart failure  4. Dyspnea  5. Encounter for preprocedural cardiovascular examination  6. Hyperlipidemia, unspecified  7. Hypertensive heart disease without heart failure  8. Left bundle-branch block, unspecified  9. Type 2 diabetes mellitus without complications  10. Presence of coronary angioplasty implant and graft  11. Dyspnea  12. Gastro-esophageal reflux disease without esophagitis ____________________________ ALLERGIES  No Known Allergies ____________________________ MEDICATIONS  1. omeprazole 40 mg Capsule, Delayed Release(E.C.), 1 p.o. daily  2. multivitamin Tablet, 1 p.o. daily  3. simvastatin 40 mg tablet, 1 p.o. daily  4. Metamucil 3.4 gram/12 gram Powder, qd  5. aspirin 81 mg chewable tablet, 1 p.o. daily  6. metformin 500 mg tablet, 2 p.o. b.i.d.  7. losartan 100 mg-hydrochlorothiazide 25 mg tablet, 1 p.o. daily  8. glimepiride 2 mg tablet, 1 p.o. daily  9. carvedilol 3.125 mg tablet, BID  10. furosemide 20 mg tablet, 1 p.o. daily ____________________________ CHIEF COMPLAINTS  Followup of Atherosclerotic heart disease of native coronary artery without angina pectoris  Followup of Chronic systolic heart failure ____________________________ HISTORY OF PRESENT ILLNESS  Patient returns for cardiac followup. He had an echocardiogram showing an EF of about 35%. A myocardial perfusion scan showed a fixed anterior defect and inferior defect with an EF of 25%. He returns with modest dyspnea with exertion. He has no PND, orthopnea, syncope, or claudication but is somewhat limited because of  dyspnea. He reports being treated with antibiotics for a recent upper respiratory infection. His echocardiogram also showed mild to moderate aortic stenosis. We discussed further evaluation of his recent respiratory events and felt that he has definite evidence of congestive heart failure and newly depressed ventricular function with a left bundle branch block pattern. ____________________________ PAST HISTORY  Past Medical Illnesses:  hypertension, DM-non-insulin dependent, hyperlipidemia, obesity, GERD, BPH, TIA;  Cardiovascular Illnesses:  CAD, aortic sclerosis;  Surgical Procedures:  TURP;  NYHA Classification:  II;  Canadian Angina Classification:  Class 0: Asymptomatic;  Cardiology Procedures-Invasive:  cardiac cath (left) February 2002, Penta stent February 2002 Dr. Glade Lloyd;  Cardiology Procedures-Noninvasive:  treadmill cardiolite December 2009, echocardiogram July 2011, echocardiogram July 2013, echocardiogram July 2015, echocardiogram January 2016, lexiscan cardiolite February 2016;  Cardiac Cath Results:  normal Left main, occluded mid LAD, 95% stenosis proximal Diag 1, scattered irregularities CFX, scattered irregularities RCA, stent placed mid LAD;  LVEF of 25% documented via nuclear study on 05/06/2014,   ____________________________ CARDIO-PULMONARY TEST DATES EKG Date:  04/13/2014;   Cardiac Cath Date:  05/08/2000;  Stent Placement Date: 05/08/2000;  Nuclear Study Date:  05/06/2014;  Echocardiography Date: 04/19/2014;  Chest Xray Date: 06/27/2007;   ____________________________ FAMILY HISTORY Brother -- Brother dead, Myocardial infarction Brother -- Brother dead, Unknown disease Brother -- Brother dead, Unknown disease Father -- Father dead, Myocardial infarction Mother -- Mother dead, Diabetes mellitus, Coronary Artery Disease ____________________________ SOCIAL HISTORY Alcohol Use:  does not use alcohol;  Smoking:  nonsmoker;  Diet:  regular diet;  Lifestyle:  widower and remarried  12 years;  Exercise:  no regular exercise;  Occupation:  [employer]-retired;  Residence:  lives with wife;   ____________________________ REVIEW OF  SYSTEMS General:  malaise and fatigue  Integumentary:no rashes or new skin lesions. Eyes: decreased acuity O.D. Ears, Nose, Throat, Mouth:  denies any hearing loss, epistaxis, hoarseness or difficulty speaking. Respiratory: see HPI Cardiovascular:  please review HPI Abdominal: denies dyspepsia, GI bleeding, constipation, or diarrhea Genitourinary-Male: frequency, nocturia  Musculoskeletal:  chronic low back pain Neurological:  some memory issues ____________________________ PHYSICAL EXAMINATION VITAL SIGNS  Blood Pressure:  126/80 Sitting, Left arm, regular cuff  , 130/80 Standing, Left arm and regular cuff   Pulse:  92/min. Weight:  204.00 lbs. Height:  69"BMI: 30  Constitutional:  pleasant white male in no acute distress, mildly obese Skin:  warm and dry to touch, no apparent skin lesions, or masses noted. Head:  normocephalic, normal hair pattern, no masses or tenderness Eyes:  strabismus ENT:  ears, nose and throat reveal no gross abnormalities.  Dentition good. Neck:  supple, without massess. No JVD, thyromegaly or carotid bruits. Carotid upstroke normal. Chest:  normal symmetry, clear to auscultation and percussion. Cardiac:  regular rhythm, normal S1 and S2, no murmur, grade 1/6 systolic murmur at aortic area radiating to neck Abdomen:  abdomen soft,non-tender, no masses, no hepatospenomegaly, or aneurysm noted Peripheral Pulses:  femoral pulses 2+, posterior tibial pulses 3+ Extremities & Back:  mild bilateral venous insufficiency changes present, 1+ edema Neurological:  no gross motor or sensory deficits noted, affect appropriate, oriented x3. ____________________________ MOST RECENT LIPID PANEL 10/19/13  CHOL TOTL 157 mg/dl, LDL 97 NM, HDL 39 mg/dl, TRIGLYCER 106 mg/dl and CHOL/HDL 4.0  (Calc) ____________________________ IMPRESSIONS/PLAN  1. Chronic systolic heart failure with worsening of LV function, left bundle branch block and a diabetic with evidence of previous infarction 2. Hypertension 3. Diabetes mellitus non-insulin-dependent 4. Hyperlipidemia  Recommendations:  Have recommended that he have cardiac catheterization to determine if he has significant progression of coronary artery disease to account for his depressed LV function. Cardiac catheterization was discussed with the patient including risks of myocardial infarction, death, stroke, bleeding, arrhythmia, dye allergy, or renal insufficiency. He understands and is willing to proceed. Possibility of percutaneous intervention at the same setting was also discussed with the patient including risks.  ____________________________ TODAYS ORDERS  1. Comprehensive Metabolic Panel: Today  2. Complete Blood Count: Today  3. Draw PT/INR: Today  4. PTT: Today  5. R&L cath on 2/15                       ____________________________ Cardiology Physician:  Kerry Hough MD [facility]

## 2014-05-17 ENCOUNTER — Ambulatory Visit (INDEPENDENT_AMBULATORY_CARE_PROVIDER_SITE_OTHER): Payer: Federal, State, Local not specified - PPO | Admitting: Pulmonary Disease

## 2014-05-17 ENCOUNTER — Encounter: Payer: Self-pay | Admitting: Pulmonary Disease

## 2014-05-17 VITALS — BP 130/82 | HR 79 | Temp 98.1°F | Ht 66.0 in | Wt 202.6 lb

## 2014-05-17 DIAGNOSIS — R0602 Shortness of breath: Secondary | ICD-10-CM

## 2014-05-17 NOTE — Progress Notes (Signed)
Subjective:    Patient ID: Jimmy Dawson, male    DOB: [birth year], 81 y.o.   MRN: [id number]  HPI  Chief Complaint  Patient presents with  . Advice Only    Referred by Dr. Nyoka Cowden; breathing problems, when he lays down to sleep at night he gets breathless, breaths real deep for a while but doesn't do any good; breathing is okay when he sits up.  Having procedure done next week to check out heart, Dr. Wynonia Lawman thinks he may have heart blockage    This is a very pleasant male with no prior past pulmonary history who was referred to my clinic today for evaluation of shortness of breath.  He tells me that he never smoked cigarettes and as a child he never had any lung problems.   For the last several weeks he has noticed difficulty lying flat because of dyspnea. He says that specifically he feels uncomfortable and short of breath and so he will sit up and sleep in a recliner. He denies mucus when lying flat, or a sensation of choking. He denies pain when this occurs as well. He does pre-better while sitting up. He has not had any recent cough or wheezing in the last few weeks.  However, he's tells me that he was diagnosed with pneumonia about 6 weeks ago. He does not recall the name of the medication that he was treated with the chart review reveals that he was treated with Augmentin for approximately 1 week. I have reviewed the chest x-ray and found no abnormality.  Notably, he states that he has not had shortness of breath in the last several weeks aside from when he lies flat. He does not exercise regularly but he says that he does "working in his shop" every day and he never feels shortness of breath with this. Further, he denies chest pain at that time.  Past Medical History  Diagnosis Date  . Type II or unspecified type diabetes mellitus without mention of complication, uncontrolled   . Hyperlipidemia   . Hypertensive heart disease   . Unspecified disorder of kidney and ureter   .  Diastasis of muscle   . Elevated prostate specific antigen (PSA)   . GERD (gastroesophageal reflux disease)   . CAD (coronary artery disease)   . Aortic valve disease      Family History  Problem Relation Age of Onset  . Heart disease Mother   . Diabetes Mother   . Heart disease Father   . Heart attack Father   . Heart disease Sister   . Diabetes Sister   . Heart disease Brother   . Diabetes Brother   . Diabetes Brother   . Heart disease Brother   . Diabetes Brother   . Heart disease Brother      History   Social History  . Marital Status: Married    Spouse Name: N/A  . Number of Children: N/A  . Years of Education: N/A   Occupational History  . Not on file.   Social History Main Topics  . Smoking status: Former Smoker -- 0.50 packs/day for 1 years    Types: Cigarettes    Quit date: 04/02/1968  . Smokeless tobacco: Never Used  . Alcohol Use: No  . Drug Use: No  . Sexual Activity: Not on file   Other Topics Concern  . Not on file   Social History Narrative   Married, live in [place], lives w/ spouse, has  a living well, retired Surveyor, mining. Does not use cafferine, exerise walk. Has dog.     No Known Allergies   Outpatient Prescriptions Prior to Visit  Medication Sig Dispense Refill  . aspirin 81 MG tablet Take 81 mg by mouth at bedtime.     . carvedilol (COREG) 3.125 MG tablet Take 1 tablet (3.125 mg total) by mouth 2 (two) times daily with a meal. 180 tablet 3  . furosemide (LASIX) 20 MG tablet Take 1 tablet (20 mg total) by mouth daily. 30 tablet 1  . glimepiride (AMARYL) 2 MG tablet Take One tablet once daily with breakfast to control diabetes 30 tablet 5  . losartan-hydrochlorothiazide (HYZAAR) 100-25 MG per tablet Take one tablet by mouth once daily to control blood pressure 90 tablet 3  . metFORMIN (GLUCOPHAGE) 500 MG tablet Take 2 tablets (1,000 mg total) by mouth 2 (two) times daily with a meal. 360 tablet 3  . Multiple Vitamin  (MULTIVITAMIN) tablet Take 1 tablet by mouth daily.     Marland Kitchen omeprazole (PRILOSEC) 40 MG capsule Take one capsule twice daily for acid reduction 180 capsule 3  . simvastatin (ZOCOR) 40 MG tablet Take one tablet by mouth once daily for cholesterol 90 tablet 3   No facility-administered medications prior to visit.       Review of Systems  Constitutional: Negative for fever and unexpected weight change.  HENT: Negative for congestion, dental problem, ear pain, nosebleeds, postnasal drip, rhinorrhea, sinus pressure, sneezing, sore throat and trouble swallowing.   Eyes: Negative for redness and itching.  Respiratory: Positive for shortness of breath. Negative for cough, chest tightness and wheezing.   Cardiovascular: Negative for palpitations and leg swelling.  Gastrointestinal: Negative for nausea and vomiting.  Genitourinary: Negative for dysuria.  Musculoskeletal: Negative for joint swelling.  Skin: Negative for rash.  Neurological: Negative for headaches.  Hematological: Does not bruise/bleed easily.  Psychiatric/Behavioral: Negative for dysphoric mood. The patient is not nervous/anxious.        Objective:   Physical Exam Filed Vitals:   05/17/14 0943  BP: 130/82  Pulse: 79  Temp: 98.1 F (36.7 C)  TempSrc: Oral  Height: 5\' 6"  (1.676 m)  Weight: 202 lb 9.6 oz (91.899 kg)  SpO2: 93%   RA  Ambulated 500 feet on room air and his O2 saturation went to 100% from 93%  Gen: chronically ill appearing, no acute distress HEENT: NCAT, PERRL, EOMi, OP clear, neck supple without masses PULM: CTA B CV: RRR, systolic murmur and gallop, no JVD AB: BS+, soft, nontender, no hsm Ext: warm, mild trace leg edema, no clubbing, no cyanosis Derm: no rash or skin breakdown Neuro: A&Ox4, CN II-XII intact, strength 5/5 in all 4 extremities  January 2016 echocardiogram LVEF 35%, LV mildly dilated. Cardiology and primary care records from 2016 reviewed today in clinic January 2016 CXR images  reviewed      Assessment & Plan:   Shortness of breath Mr. Jimmy Dawson has a very specific complaint that he notes shortness of breath when he lies flat.  He states that he gets "weak in his work" when he walks. Specifically, he says that he starts the feel weakness in his back and leg muscles that he does not necessarily feel shortness of breath.  This sounds like a vascular/cardiac problem at this point (heart failure? Valvular disease?) and as detailed below there is little evidence for pulmonary disease.  Objectively, he is normal, his ambulatory oximetry is normal, and his chest x-ray was  normal. He has no airflow obstruction on pulmonary function testing. Simple spirometry did show a suggestion of restriction which may be a function of his body habitus.  He would need full pulmonary function testing in order to  Investigate this further. So at this point I see little evidence of lung disease.  Considering his abnormal stress test, I did the best approach at this point to workup his shortness of breath is to have the cardiac catheterization next week.   Plan: -proceed with cardiac work up (left heart catheterization) -if cardiac work up is unrevealing, will perform full pulmonary function testing.     Updated Medication List Outpatient Encounter Prescriptions as of 05/17/2014  Medication Sig  . aspirin 81 MG tablet Take 81 mg by mouth at bedtime.   . carvedilol (COREG) 3.125 MG tablet Take 1 tablet (3.125 mg total) by mouth 2 (two) times daily with a meal.  . furosemide (LASIX) 20 MG tablet Take 1 tablet (20 mg total) by mouth daily.  Marland Kitchen glimepiride (AMARYL) 2 MG tablet Take One tablet once daily with breakfast to control diabetes  . ibuprofen (ADVIL,MOTRIN) 200 MG tablet Take 200 mg by mouth 2 (two) times daily.  Marland Kitchen losartan-hydrochlorothiazide (HYZAAR) 100-25 MG per tablet Take one tablet by mouth once daily to control blood pressure  . metFORMIN (GLUCOPHAGE) 500 MG tablet Take 2 tablets  (1,000 mg total) by mouth 2 (two) times daily with a meal.  . Multiple Vitamin (MULTIVITAMIN) tablet Take 1 tablet by mouth daily.   Marland Kitchen omeprazole (PRILOSEC) 40 MG capsule Take one capsule twice daily for acid reduction  . simvastatin (ZOCOR) 40 MG tablet Take one tablet by mouth once daily for cholesterol

## 2014-05-17 NOTE — Assessment & Plan Note (Signed)
Mr. Jimmy Dawson has a very specific complaint that he notes shortness of breath when he lies flat.  He states that he gets "weak in his work" when he walks. Specifically, he says that he starts the feel weakness in his back and leg muscles that he does not necessarily feel shortness of breath.  This sounds like a vascular/cardiac problem at this point (heart failure? Valvular disease?) and as detailed below there is little evidence for pulmonary disease.  Objectively, he is normal, his ambulatory oximetry is normal, and his chest x-ray was normal. He has no airflow obstruction on pulmonary function testing. Simple spirometry did show a suggestion of restriction which may be a function of his body habitus.  He would need full pulmonary function testing in order to  Investigate this further. So at this point I see little evidence of lung disease.  Considering his abnormal stress test, I did the best approach at this point to workup his shortness of breath is to have the cardiac catheterization next week.   Plan: -proceed with cardiac work up (left heart catheterization) -if cardiac work up is unrevealing, will perform full pulmonary function testing.

## 2014-05-17 NOTE — Patient Instructions (Signed)
I agree that you should have the heart catheterization this week If that is normal or if it does not help your problem breathing, then I would like for you to have another breathing test. WE will see you back in 4-6 weeks to see how things are going and decide if we need more tests

## 2014-05-20 ENCOUNTER — Encounter: Payer: Self-pay | Admitting: Internal Medicine

## 2014-05-24 ENCOUNTER — Encounter (HOSPITAL_COMMUNITY)
Admission: RE | Disposition: A | Discharge status: Home / Self Care | Payer: Self-pay | Source: Ambulatory Visit | Attending: Cardiology

## 2014-05-24 ENCOUNTER — Encounter (HOSPITAL_COMMUNITY): Payer: Self-pay | Admitting: Cardiology

## 2014-05-24 ENCOUNTER — Ambulatory Visit (HOSPITAL_COMMUNITY)
Admission: RE | Admit: 2014-05-24 | Discharge: 2014-05-24 | Disposition: A | Discharge status: Home / Self Care | Payer: Medicare Other | Source: Ambulatory Visit | Attending: Cardiology | Admitting: Cardiology

## 2014-05-24 DIAGNOSIS — Z6833 Body mass index (BMI) 33.0-33.9, adult: Secondary | ICD-10-CM | POA: Insufficient documentation

## 2014-05-24 DIAGNOSIS — Z79899 Other long term (current) drug therapy: Secondary | ICD-10-CM | POA: Insufficient documentation

## 2014-05-24 DIAGNOSIS — I447 Left bundle-branch block, unspecified: Secondary | ICD-10-CM | POA: Diagnosis not present

## 2014-05-24 DIAGNOSIS — E119 Type 2 diabetes mellitus without complications: Secondary | ICD-10-CM | POA: Diagnosis not present

## 2014-05-24 DIAGNOSIS — E785 Hyperlipidemia, unspecified: Secondary | ICD-10-CM | POA: Diagnosis not present

## 2014-05-24 DIAGNOSIS — N400 Enlarged prostate without lower urinary tract symptoms: Secondary | ICD-10-CM | POA: Diagnosis not present

## 2014-05-24 DIAGNOSIS — I509 Heart failure, unspecified: Secondary | ICD-10-CM | POA: Diagnosis present

## 2014-05-24 DIAGNOSIS — I350 Nonrheumatic aortic (valve) stenosis: Secondary | ICD-10-CM | POA: Insufficient documentation

## 2014-05-24 DIAGNOSIS — I5022 Chronic systolic (congestive) heart failure: Secondary | ICD-10-CM | POA: Diagnosis not present

## 2014-05-24 DIAGNOSIS — K219 Gastro-esophageal reflux disease without esophagitis: Secondary | ICD-10-CM | POA: Diagnosis not present

## 2014-05-24 DIAGNOSIS — Z01818 Encounter for other preprocedural examination: Secondary | ICD-10-CM | POA: Diagnosis not present

## 2014-05-24 DIAGNOSIS — I110 Hypertensive heart disease with heart failure: Secondary | ICD-10-CM | POA: Diagnosis not present

## 2014-05-24 DIAGNOSIS — I272 Other secondary pulmonary hypertension: Secondary | ICD-10-CM | POA: Diagnosis not present

## 2014-05-24 DIAGNOSIS — Z833 Family history of diabetes mellitus: Secondary | ICD-10-CM | POA: Diagnosis not present

## 2014-05-24 DIAGNOSIS — E669 Obesity, unspecified: Secondary | ICD-10-CM | POA: Diagnosis not present

## 2014-05-24 DIAGNOSIS — Z7982 Long term (current) use of aspirin: Secondary | ICD-10-CM | POA: Diagnosis not present

## 2014-05-24 DIAGNOSIS — Z8673 Personal history of transient ischemic attack (TIA), and cerebral infarction without residual deficits: Secondary | ICD-10-CM | POA: Insufficient documentation

## 2014-05-24 DIAGNOSIS — I2510 Atherosclerotic heart disease of native coronary artery without angina pectoris: Secondary | ICD-10-CM | POA: Diagnosis not present

## 2014-05-24 DIAGNOSIS — R079 Chest pain, unspecified: Secondary | ICD-10-CM | POA: Diagnosis present

## 2014-05-24 HISTORY — PX: LEFT AND RIGHT HEART CATHETERIZATION WITH CORONARY ANGIOGRAM: SHX5449

## 2014-05-24 LAB — POCT I-STAT 3, VENOUS BLOOD GAS (G3P V)
ACID-BASE EXCESS: 3 mmol/L — AB (ref 0.0–2.0)
Bicarbonate: 29 mEq/L — ABNORMAL HIGH (ref 20.0–24.0)
O2 Saturation: 52 %
TCO2: 30 mmol/L (ref 0–100)
pCO2, Ven: 47.6 mmHg (ref 45.0–50.0)
pH, Ven: 7.393 — ABNORMAL HIGH (ref 7.250–7.300)
pO2, Ven: 28 mmHg — CL (ref 30.0–45.0)

## 2014-05-24 LAB — GLUCOSE, CAPILLARY
GLUCOSE-CAPILLARY: 79 mg/dL (ref 70–99)
Glucose-Capillary: 147 mg/dL — ABNORMAL HIGH (ref 70–99)
Glucose-Capillary: 114 mg/dL — ABNORMAL HIGH (ref 70–99)

## 2014-05-24 SURGERY — LEFT AND RIGHT HEART CATHETERIZATION WITH CORONARY ANGIOGRAM
Anesthesia: LOCAL

## 2014-05-24 MED ORDER — HEPARIN (PORCINE) IN NACL 2-0.9 UNIT/ML-% IJ SOLN
INTRAMUSCULAR | Status: AC
  Filled 2014-05-24: qty 500

## 2014-05-24 MED ORDER — SODIUM CHLORIDE 0.9 % IJ SOLN: 3.0000 mL | INTRAMUSCULAR | Status: DC | PRN

## 2014-05-24 MED ORDER — HEPARIN (PORCINE) IN NACL 2-0.9 UNIT/ML-% IJ SOLN
INTRAMUSCULAR | Status: AC
  Filled 2014-05-24: qty 1000

## 2014-05-24 MED ORDER — ONDANSETRON HCL 4 MG/2ML IJ SOLN: 4.0000 mg | Freq: Four times a day (QID) | INTRAMUSCULAR | Status: DC | PRN

## 2014-05-24 MED ORDER — ACETAMINOPHEN 325 MG PO TABS: 650.0000 mg | ORAL_TABLET | ORAL | Status: DC | PRN

## 2014-05-24 MED ORDER — NITROGLYCERIN 1 MG/10 ML FOR IR/CATH LAB
INTRA_ARTERIAL | Status: AC
  Filled 2014-05-24: qty 10

## 2014-05-24 MED ORDER — ASPIRIN 81 MG PO CHEW
81.0000 mg | CHEWABLE_TABLET | ORAL | Status: AC
  Administered 2014-05-24: 81 mg via ORAL

## 2014-05-24 MED ORDER — SODIUM CHLORIDE 0.9 % IV SOLN: 1.0000 mL/kg/h | INTRAVENOUS | Status: DC

## 2014-05-24 MED ORDER — SODIUM CHLORIDE 0.9 % IJ SOLN: 3.0000 mL | Freq: Two times a day (BID) | INTRAMUSCULAR | Status: DC

## 2014-05-24 MED ORDER — ASPIRIN 81 MG PO CHEW
CHEWABLE_TABLET | ORAL | Status: AC
  Filled 2014-05-24: qty 1

## 2014-05-24 MED ORDER — SODIUM CHLORIDE 0.9 % IV SOLN: 250.0000 mL | INTRAVENOUS | Status: DC | PRN

## 2014-05-24 MED ORDER — SODIUM CHLORIDE 0.9 % IV SOLN
INTRAVENOUS | Status: DC
  Administered 2014-05-24: 07:00:00 via INTRAVENOUS

## 2014-05-24 MED ORDER — LIDOCAINE HCL (PF) 1 % IJ SOLN
INTRAMUSCULAR | Status: AC
  Filled 2014-05-24: qty 30

## 2014-05-24 NOTE — Progress Notes (Signed)
Site area: rt groin Site Prior to Removal:  Level  0 Pressure Applied For: 20 minutes; sheaths removed and pressure held by L. Murphy,RN Manual:   yes Patient Status During Pull:  stable Post Pull Site:  Level  0 Post Pull Instructions Given:  yes Post Pull Pulses Present: yes Dressing Applied:  tegaderm Bedrest begins @ 7425 Comments: no complications

## 2014-05-24 NOTE — Discharge Instructions (Signed)
Please hold metformin for 2 days.  May resume on Weds morning Angiogram, Care After Refer to this sheet in the next few weeks. These instructions provide you with information on caring for yourself after your procedure. Your health care provider may also give you more specific instructions. Your treatment has been planned according to current medical practices, but problems sometimes occur. Call your health care provider if you have any problems or questions after your procedure.  WHAT TO EXPECT AFTER THE PROCEDURE After your procedure, it is typical to have the following sensations:  Minor discomfort or tenderness and a small bump at the catheter insertion site. The bump should usually decrease in size and tenderness within 1 to 2 weeks.  Any bruising will usually fade within 2 to 4 weeks. HOME CARE INSTRUCTIONS   You may need to keep taking blood thinners if they were prescribed for you. Take medicines only as directed by your health care provider.  Do not apply powder or lotion to the site.  Do not take baths, swim, or use a hot tub until your health care provider approves.  You may shower 24 hours after the procedure. Remove the bandage (dressing) and gently wash the site with plain soap and water. Gently pat the site dry.  Inspect the site at least twice daily.  Limit your activity for the first 48 hours. Do not bend, squat, or lift anything over 20 lb (9 kg) or as directed by your health care provider.  Plan to have someone take you home after the procedure. Follow instructions about when you can drive or return to work. SEEK MEDICAL CARE IF:  You get light-headed when standing up.  You have drainage (other than a small amount of blood on the dressing).  You have chills.  You have a fever.  You have redness, warmth, swelling, or pain at the insertion site. SEEK IMMEDIATE MEDICAL CARE IF:   You develop chest pain or shortness of breath, feel faint, or pass out.  You have  bleeding, swelling larger than a walnut, or drainage from the catheter insertion site.  You develop pain, discoloration, coldness, or severe bruising in the leg or arm that held the catheter.  You develop bleeding from any other place, such as the bowels. You may see bright red blood in your urine or stools, or your stools may appear black and tarry.  You have heavy bleeding from the site. If this happens, hold pressure on the site. MAKE SURE YOU:  Understand these instructions.  Will watch your condition.  Will get help right away if you are not doing well or get worse. Document Released: 10/05/2004 Document Revised: 08/03/2013 Document Reviewed: 08/11/2012 [facility] Patient Information 2015 [place], [place]. This information is not intended to replace advice given to you by your health care provider. Make sure you discuss any questions you have with your health care provider.

## 2014-05-24 NOTE — Interval H&P Note (Signed)
History and Physical Interval Note:  05/24/2014 7:35 AM  Jimmy Dawson  has presented today for surgery, with the diagnosis of c/p chf  The various methods of treatment have been discussed with the patient and family. After consideration of risks, benefits and other options for treatment, the patient has consented to  Procedure(s): LEFT AND RIGHT HEART CATHETERIZATION WITH CORONARY ANGIOGRAM (N/A) as a surgical intervention .  The patient's history has been reviewed, patient examined, no change in status, stable for surgery.  I have reviewed the patient's chart and labs.  Questions were answered to the patient's satisfaction.     Jimmy Dawson,W SPENCER

## 2014-05-24 NOTE — CV Procedure (Signed)
CARDIAC CATHETERIZATION REPORT   Jimmy Dawson      81 y.o.  male   DOB: 05-15-1928   MRN: [id number]  Today's Date: 05/24/2014   PROCEDURE:  Right and left heart catheterization with selective coronary angiography, left ventriculogram.  INDICATIONS:  Abnormal Myoview test, dyspnea on exertion, prior history of coronary artery disease stenting, abnormal ejection fraction  The risks, benefits, and details of the procedure were explained to the patient.  The patient verbalized understanding and wanted to proceed.  Informed written consent was obtained.  PROCEDURE TECHNIQUE:   After Xylocaine anesthesia a 40F sheath was placed in the right femoral vein. Right heart pressures were measured with a Swan-Ganz catheter, pulmonary artery saturation was measured, and thermodilution cardiac outputs were done.  A 25F sheath was then  placed in the right femoral artery with a single anterior needle wall stick.   Left coronary angiography was done using a Judkins L4 guide catheter.  Right coronary angiography was done using a Judkins R4 guide catheter.  The aortic valve was crossed on the first attempt with a straight guidewire.  There was minimal gradient across the aortic valve.  Because of the markedly elevated LVEDP, a ventriculogram was not performed.  The sheath was removed in the holding area.  The patient tolerated the procedure well.   CONTRAST:  Total of 55 cc.  ESTIMATED BLOOD LOSS:  Minimal   COMPLICATIONS:  None.    HEMODYNAMICS:   Right atrium:           A = 13 V equals 9, mean equals 9  Right Ventricle:       58/7013 Pulmonary Artery:   58/35               Sat= 52% PCWP:                    A = 30 V =32 mean equals 30 Aorta                      122/76                  Sat= 93%           LV                         130/25-30.    Aortic valve area 1.94 cm  Cardiac output 4.62 L/m There was minimal gradient between the left ventricle and aorta.     ANGIOGRAPHIC DATA:    CORONARY ARTERIES:   Arise and distribute normally.  Right dominant.  Moderate coronary calcification is noted.  Left main coronary artery: Calcification at the ostium.  The left main appears to taper and has a 40-50% ostial stenosis of the distal left main is normal  Left anterior descending: Heavily calcified.  There is diffuse 40% narrowing proximally at the area of calcification but no severe focal obstructive stenoses are noted.    Circumflex coronary artery: Calcification, scattered irregularities  Right coronary artery: Mild calcification, scattered irregularities  LEFT VENTRICULOGRAM:  Not performed  IMPRESSIONS:  1. Moderate left main coronary artery disease with calcified and moderate LAD disease but no severe focal obstructive stenoses noted elsewhere 2. Moderate pulmonary hypertension.Marland Kitchen  RECOMMENDATION:  Treatment of cardiomyopathy, diuresis.  Continued medical treatment.   Kerry Hough MD [facility]

## 2014-05-25 LAB — POCT I-STAT 3, ART BLOOD GAS (G3+)
Acid-Base Excess: 3 mmol/L — ABNORMAL HIGH (ref 0.0–2.0)
Bicarbonate: 27.8 mEq/L — ABNORMAL HIGH (ref 20.0–24.0)
O2 Saturation: 93 %
PH ART: 7.407 (ref 7.350–7.450)
PO2 ART: 66 mmHg — AB (ref 80.0–100.0)
TCO2: 29 mmol/L (ref 0–100)
pCO2 arterial: 44.1 mmHg (ref 35.0–45.0)

## 2014-05-31 ENCOUNTER — Encounter: Payer: Self-pay | Admitting: Cardiology

## 2014-05-31 DIAGNOSIS — I2510 Atherosclerotic heart disease of native coronary artery without angina pectoris: Secondary | ICD-10-CM

## 2014-06-05 ENCOUNTER — Other Ambulatory Visit: Payer: Self-pay | Admitting: Internal Medicine

## 2014-06-24 ENCOUNTER — Ambulatory Visit: Payer: Medicare Other | Admitting: Pulmonary Disease

## 2014-06-24 ENCOUNTER — Other Ambulatory Visit: Payer: Self-pay

## 2014-06-29 ENCOUNTER — Other Ambulatory Visit: Payer: Federal, State, Local not specified - PPO

## 2014-06-29 DIAGNOSIS — I5032 Chronic diastolic (congestive) heart failure: Secondary | ICD-10-CM

## 2014-06-29 DIAGNOSIS — IMO0002 Reserved for concepts with insufficient information to code with codable children: Secondary | ICD-10-CM

## 2014-06-29 DIAGNOSIS — E1165 Type 2 diabetes mellitus with hyperglycemia: Principal | ICD-10-CM

## 2014-06-29 DIAGNOSIS — E1129 Type 2 diabetes mellitus with other diabetic kidney complication: Secondary | ICD-10-CM

## 2014-06-29 DIAGNOSIS — R0600 Dyspnea, unspecified: Secondary | ICD-10-CM

## 2014-06-30 ENCOUNTER — Encounter: Payer: Self-pay | Admitting: Internal Medicine

## 2014-06-30 ENCOUNTER — Ambulatory Visit (INDEPENDENT_AMBULATORY_CARE_PROVIDER_SITE_OTHER): Payer: Federal, State, Local not specified - PPO | Admitting: Internal Medicine

## 2014-06-30 VITALS — BP 132/84 | HR 83 | Temp 98.2°F | Ht 66.0 in | Wt 201.2 lb

## 2014-06-30 DIAGNOSIS — IMO0002 Reserved for concepts with insufficient information to code with codable children: Secondary | ICD-10-CM

## 2014-06-30 DIAGNOSIS — N183 Chronic kidney disease, stage 3 unspecified: Secondary | ICD-10-CM

## 2014-06-30 DIAGNOSIS — E1165 Type 2 diabetes mellitus with hyperglycemia: Secondary | ICD-10-CM

## 2014-06-30 DIAGNOSIS — E1129 Type 2 diabetes mellitus with other diabetic kidney complication: Secondary | ICD-10-CM | POA: Diagnosis not present

## 2014-06-30 DIAGNOSIS — R0602 Shortness of breath: Secondary | ICD-10-CM | POA: Diagnosis not present

## 2014-06-30 DIAGNOSIS — R413 Other amnesia: Secondary | ICD-10-CM | POA: Diagnosis not present

## 2014-06-30 DIAGNOSIS — I2510 Atherosclerotic heart disease of native coronary artery without angina pectoris: Secondary | ICD-10-CM | POA: Diagnosis not present

## 2014-06-30 DIAGNOSIS — I5022 Chronic systolic (congestive) heart failure: Secondary | ICD-10-CM

## 2014-06-30 LAB — HEMOGLOBIN A1C
Est. average glucose Bld gHb Est-mCnc: 143 mg/dL
HEMOGLOBIN A1C: 6.6 % — AB (ref 4.8–5.6)

## 2014-06-30 LAB — BASIC METABOLIC PANEL
BUN / CREAT RATIO: 20 (ref 10–22)
BUN: 29 mg/dL — ABNORMAL HIGH (ref 8–27)
CO2: 26 mmol/L (ref 18–29)
Calcium: 9.8 mg/dL (ref 8.6–10.2)
Chloride: 93 mmol/L — ABNORMAL LOW (ref 97–108)
Creatinine, Ser: 1.44 mg/dL — ABNORMAL HIGH (ref 0.76–1.27)
GFR, EST AFRICAN AMERICAN: 52 mL/min/{1.73_m2} — AB (ref >59)
GFR, EST NON AFRICAN AMERICAN: 45 mL/min/{1.73_m2} — AB (ref >59)
GLUCOSE: 184 mg/dL — AB (ref 65–99)
Potassium: 3.9 mmol/L (ref 3.5–5.2)
SODIUM: 138 mmol/L (ref 134–144)

## 2014-06-30 LAB — MICROALBUMIN, URINE: MICROALBUM., U, RANDOM: 25.8 ug/mL — AB (ref 0.0–17.0)

## 2014-06-30 LAB — BRAIN NATRIURETIC PEPTIDE: BNP: 214.2 pg/mL — ABNORMAL HIGH (ref 0.0–100.0)

## 2014-06-30 NOTE — Progress Notes (Signed)
Passed clock drawing test given by Rafael Bihari, CMA

## 2014-06-30 NOTE — Progress Notes (Signed)
Patient ID: Jimmy Dawson, male   DOB: 08-20-1928, 81 y.o.   MRN: [id number]    Facility  [facility]    Place of Service:   OFFICE   No Known Allergies  Chief Complaint  Patient presents with  . Medical Management of Chronic Issues    Follow up    HPI:   Coronary artery disease involving native coronary artery of native heart without angina pectoris: Recently had heart catheterization. Calcifications were noted in some arteries, but none of a severe degree. He denies any angina now. Denies palpitations.  Chronic systolic heart failure: Mild and under control. LVEF estimated 35%.  DM (diabetes mellitus), type 2, uncontrolled, with renal complications: Under good control  Mild memory disturbance: Wife says he is very forgetful. He also is emotionally unstable per her history with "rages". Patient believes he gets confused sometimes but feels that his memory is doing well overall. His history is at variance with his wife's in regards to his emotional stability.  Stage III chronic kidney disease: Unchanged  Shortness of breath: Improved since on higher doses of diuretic.    Medications: Patient's Medications  New Prescriptions   No medications on file  Previous Medications   ASPIRIN 81 MG TABLET    Take 81 mg by mouth at bedtime.    CARVEDILOL (COREG) 3.125 MG TABLET    Take 1 tablet (3.125 mg total) by mouth 2 (two) times daily with a meal.   FUROSEMIDE (LASIX) 40 MG TABLET    Take one tablet by mouth once daily for swelling   GLIMEPIRIDE (AMARYL) 2 MG TABLET    Take One tablet once daily with breakfast to control diabetes   IBUPROFEN (ADVIL,MOTRIN) 200 MG TABLET    Take 200 mg by mouth 2 (two) times daily.   LOSARTAN-HYDROCHLOROTHIAZIDE (HYZAAR) 100-25 MG PER TABLET    Take one tablet by mouth once daily to control blood pressure   METFORMIN (GLUCOPHAGE) 500 MG TABLET    Take 2 tablets (1,000 mg total) by mouth 2 (two) times daily with a meal.   MULTIPLE VITAMIN (MULTIVITAMIN)  TABLET    Take 1 tablet by mouth daily.    OMEPRAZOLE (PRILOSEC) 40 MG CAPSULE    Take one capsule twice daily for acid reduction   SIMVASTATIN (ZOCOR) 40 MG TABLET    Take one tablet by mouth once daily for cholesterol  Modified Medications   No medications on file  Discontinued Medications   FUROSEMIDE (LASIX) 20 MG TABLET    Take 1 tablet (20 mg total) by mouth daily.   GLIMEPIRIDE (AMARYL) 2 MG TABLET    TAKE 1 TABLET BY MOUTH EVERY DAY WITH BREAKFAST TO CONTROL DIABETES     Review of Systems  Constitutional: Negative.  Negative for fever, chills, diaphoresis, activity change, appetite change and fatigue.  HENT: Positive for hearing loss. Negative for congestion, ear pain, nosebleeds, rhinorrhea, sinus pressure and tinnitus.        Wears prescription lenses. Otherwise negative in regards to symptoms. Wax in both EAC and impacted in right ear.  Eyes:       Wears prescription lenses. Negative for all other symptoms.  Respiratory: Negative for cough, choking, chest tightness, shortness of breath and wheezing.   Cardiovascular: Negative for chest pain, palpitations and leg swelling.  Gastrointestinal: Negative for abdominal pain, diarrhea, constipation and blood in stool.  Endocrine: Negative for cold intolerance, heat intolerance, polydipsia, polyphagia and polyuria.  Genitourinary: Negative for dysuria, urgency, frequency, flank pain, decreased urine  volume, enuresis and difficulty urinating.  Musculoskeletal: Negative for myalgias, back pain, arthralgias and gait problem.  Skin:       Itching has resolved.  Allergic/Immunologic: Negative.   Neurological: Negative for dizziness, tremors, speech difficulty, weakness, light-headedness, numbness and headaches.       Patient says he is aware that his memory is slipping. 06/30/2014 MMSE 30/30. Passed clock drawing.  Hematological: Negative.   Psychiatric/Behavioral: Negative.     Filed Vitals:   06/30/14 1428  BP: 132/84  Pulse: 83    Temp: 98.2 F (36.8 C)  TempSrc: Oral  Height: 5\' 6"  (1.676 m)  Weight: 201 lb 3.2 oz (91.264 kg)   Body mass index is 32.49 kg/(m^2).  Physical Exam  Constitutional: He is oriented to person, place, and time. He appears well-developed and well-nourished.  HENT:  Significant hearing loss bilaterally. Interferes with conversation.  Eyes:  Wears prescription lenses, otherwise normal.  Neck: Neck supple. No JVD present. No tracheal deviation present. No thyromegaly present.  Cardiovascular: Normal rate, regular rhythm, normal heart sounds and intact distal pulses.  Exam reveals no gallop.   No murmur heard. Pulmonary/Chest: Breath sounds normal. No respiratory distress. He has no wheezes. He has no rales. He exhibits no tenderness.  Abdominal: Soft. Bowel sounds are normal. He exhibits no mass. There is no tenderness.  Musculoskeletal: Normal range of motion.  Chronic back discomfort with movement.  Lymphadenopathy:    He has no cervical adenopathy.  Neurological: He is alert and oriented to person, place, and time. No cranial nerve deficit. Coordination normal.  Wife has reported issues with his memory in the past. He now says that he is aware of some memory slippage. He does not feel that it incapacitates him in any way.  Skin: Skin is warm and dry. No rash noted. No erythema.  Lesion on the right shoulder that may be a ringworm.  Psychiatric: He has a normal mood and affect. His behavior is normal. Judgment and thought content normal.     Labs reviewed: Appointment on 06/29/2014  Component Date Value Ref Range Status  . Hgb A1c MFr Bld 06/29/2014 6.6* 4.8 - 5.6 % Final   Comment:          Pre-diabetes: 5.7 - 6.4          Diabetes: >6.4          Glycemic control for adults with diabetes: <7.0   . Est. average glucose Bld gHb Est-m* 06/29/2014 143   Final  . Glucose 06/29/2014 184* 65 - 99 mg/dL Final  . BUN 06/29/2014 29* 8 - 27 mg/dL Final  . Creatinine, Ser 06/29/2014  1.44* 0.76 - 1.27 mg/dL Final  . GFR calc non Af Amer 06/29/2014 45* >59 mL/min/1.73 Final  . GFR calc Af Amer 06/29/2014 52* >59 mL/min/1.73 Final  . BUN/Creatinine Ratio 06/29/2014 20  10 - 22 Final  . Sodium 06/29/2014 138  134 - 144 mmol/L Final  . Potassium 06/29/2014 3.9  3.5 - 5.2 mmol/L Final  . Chloride 06/29/2014 93* 97 - 108 mmol/L Final  . CO2 06/29/2014 26  18 - 29 mmol/L Final  . Calcium 06/29/2014 9.8  8.6 - 10.2 mg/dL Final  . Microalbum.,U,Random 06/29/2014 25.8* 0.0 - 17.0 ug/mL Final  . BNP 06/29/2014 214.2* 0.0 - 100.0 pg/mL Final  Admission on 05/24/2014, Discharged on 05/24/2014  Component Date Value Ref Range Status  . Glucose-Capillary 05/24/2014 147* 70 - 99 mg/dL Final  . Comment 1 05/24/2014 Notify RN  Final  . Glucose-Capillary 05/24/2014 79  70 - 99 mg/dL Final  . Glucose-Capillary 05/24/2014 114* 70 - 99 mg/dL Final  . pH, Ven 05/24/2014 7.393* 7.250 - 7.300 Final  . pCO2, Ven 05/24/2014 47.6  45.0 - 50.0 mmHg Final  . pO2, Ven 05/24/2014 28.0* 30.0 - 45.0 mmHg Final  . Bicarbonate 05/24/2014 29.0* 20.0 - 24.0 mEq/L Final  . TCO2 05/24/2014 30  0 - 100 mmol/L Final  . O2 Saturation 05/24/2014 52.0   Final  . Acid-Base Excess 05/24/2014 3.0* 0.0 - 2.0 mmol/L Final  . Sample type 05/24/2014 VENOUS   Final  . pH, Arterial 05/24/2014 7.407  7.350 - 7.450 Final  . pCO2 arterial 05/24/2014 44.1  35.0 - 45.0 mmHg Final  . pO2, Arterial 05/24/2014 66.0* 80.0 - 100.0 mmHg Final  . Bicarbonate 05/24/2014 27.8* 20.0 - 24.0 mEq/L Final  . TCO2 05/24/2014 29  0 - 100 mmol/L Final  . O2 Saturation 05/24/2014 93.0   Final  . Acid-Base Excess 05/24/2014 3.0* 0.0 - 2.0 mmol/L Final  . Sample type 05/24/2014 ARTERIAL   Final  Appointment on 04/26/2014  Component Date Value Ref Range Status  . Cholesterol, Total 04/26/2014 146  100 - 199 mg/dL Final  . Triglycerides 04/26/2014 108  0 - 149 mg/dL Final  . HDL 04/26/2014 40  >39 mg/dL Final   Comment: According to  ATP-III Guidelines, HDL-C >59 mg/dL is considered a negative risk factor for CHD.   Marland Kitchen VLDL Cholesterol Cal 04/26/2014 22  5 - 40 mg/dL Final  . LDL Calculated 04/26/2014 84  0 - 99 mg/dL Final  . Chol/HDL Ratio 04/26/2014 3.7  0.0 - 5.0 ratio units Final   Comment:                                   T. Chol/HDL Ratio                                             Men  Women                               1/2 Avg.Risk  3.4    3.3                                   Avg.Risk  5.0    4.4                                2X Avg.Risk  9.6    7.1                                3X Avg.Risk 23.4   11.0   . Microalbum.,U,Random 04/26/2014 199.2* 0.0 - 17.0 ug/mL Final  . Glucose 04/26/2014 134* 65 - 99 mg/dL Final  . BUN 04/26/2014 16  8 - 27 mg/dL Final  . Creatinine, Ser 04/26/2014 1.13  0.76 - 1.27 mg/dL Final  . GFR calc non Af Amer 04/26/2014 60  >59 mL/min/1.73 Final  . GFR calc Af Amer 04/26/2014 69  >59 mL/min/1.73  Final  . BUN/Creatinine Ratio 04/26/2014 14  10 - 22 Final  . Sodium 04/26/2014 141  134 - 144 mmol/L Final  . Potassium 04/26/2014 4.0  3.5 - 5.2 mmol/L Final  . Chloride 04/26/2014 95* 97 - 108 mmol/L Final  . CO2 04/26/2014 26  18 - 29 mmol/L Final  . Calcium 04/26/2014 9.6  8.6 - 10.2 mg/dL Final  Office Visit on 04/12/2014  Component Date Value Ref Range Status  . Glucose 04/12/2014 73  65 - 99 mg/dL Final  . BUN 04/12/2014 22  8 - 27 mg/dL Final  . Creatinine, Ser 04/12/2014 1.20  0.76 - 1.27 mg/dL Final  . GFR calc non Af Amer 04/12/2014 56* >59 mL/min/1.73 Final  . GFR calc Af Amer 04/12/2014 64  >59 mL/min/1.73 Final  . BUN/Creatinine Ratio 04/12/2014 18  10 - 22 Final  . Sodium 04/12/2014 142  134 - 144 mmol/L Final  . Potassium 04/12/2014 4.3  3.5 - 5.2 mmol/L Final  . Chloride 04/12/2014 95* 97 - 108 mmol/L Final  . CO2 04/12/2014 30* 18 - 29 mmol/L Final  . Calcium 04/12/2014 9.9  8.6 - 10.2 mg/dL Final  Office Visit on 04/05/2014  Component Date Value Ref Range  Status  . Hgb A1c MFr Bld 04/05/2014 7.0* 4.8 - 5.6 % Final   Comment:          Pre-diabetes: 5.7 - 6.4          Diabetes: >6.4          Glycemic control for adults with diabetes: <7.0   . Est. average glucose Bld gHb Est-m* 04/05/2014 154   Final  . Glucose 04/05/2014 98  65 - 99 mg/dL Final  . BUN 04/05/2014 33* 8 - 27 mg/dL Final  . Creatinine, Ser 04/05/2014 1.39* 0.76 - 1.27 mg/dL Final  . GFR calc non Af Amer 04/05/2014 47* >59 mL/min/1.73 Final  . GFR calc Af Amer 04/05/2014 54* >59 mL/min/1.73 Final  . BUN/Creatinine Ratio 04/05/2014 24* 10 - 22 Final  . Sodium 04/05/2014 145* 134 - 144 mmol/L Final  . Potassium 04/05/2014 3.7  3.5 - 5.2 mmol/L Final  . Chloride 04/05/2014 101  97 - 108 mmol/L Final  . CO2 04/05/2014 29  18 - 29 mmol/L Final  . Calcium 04/05/2014 9.2  8.6 - 10.2 mg/dL Final  . BNP 04/05/2014 752.2* 0.0 - 100.0 pg/mL Final     Assessment/Plan  1. Coronary artery disease involving native coronary artery of native heart without angina pectoris Stable  2. Chronic systolic heart failure Mild dyspnea on exertion. - Brain natriuretic peptide; Future  3. DM (diabetes mellitus), type 2, uncontrolled, with renal complications Controlled - Hemoglobin A1c; Future - Comprehensive metabolic panel; Future  4. Mild memory disturbance Continue to monitor with MMSE It is not clear to me that this patient has progressive memory loss. There is some disturbance of memory, despite normal scores on the MMSE. Frontal lobe dementia could explain this.  5. Stage III chronic kidney disease Unchanged - Comprehensive metabolic panel; Future  6. Shortness of breath - Brain natriuretic peptide; Future

## 2014-07-08 ENCOUNTER — Encounter: Payer: Self-pay | Admitting: *Deleted

## 2014-07-19 ENCOUNTER — Ambulatory Visit (INDEPENDENT_AMBULATORY_CARE_PROVIDER_SITE_OTHER): Payer: Federal, State, Local not specified - PPO | Admitting: Pulmonary Disease

## 2014-07-19 ENCOUNTER — Encounter: Payer: Self-pay | Admitting: Pulmonary Disease

## 2014-07-19 VITALS — BP 116/66 | HR 88 | Ht 66.0 in | Wt 203.0 lb

## 2014-07-19 DIAGNOSIS — K2100 Gastro-esophageal reflux disease with esophagitis, without bleeding: Secondary | ICD-10-CM

## 2014-07-19 DIAGNOSIS — I2510 Atherosclerotic heart disease of native coronary artery without angina pectoris: Secondary | ICD-10-CM

## 2014-07-19 DIAGNOSIS — R0602 Shortness of breath: Secondary | ICD-10-CM | POA: Diagnosis not present

## 2014-07-19 NOTE — Assessment & Plan Note (Signed)
Problem is resolved with increased dose of Lasix. Right heart catheterization showed increased left-sided filling pressures suggested at least diastolic heart failure with reading is dyspnea. His chest x-ray and spirometry and oximetry exam are not with underlying lung disease.  At this time I do not recommend a further workup for his dyspnea because the problem has resolved.

## 2014-07-19 NOTE — Progress Notes (Signed)
   Subjective:    Patient ID: Jimmy Dawson, male    DOB: 1928-09-01, 81 y.o.   MRN: [id number]  HPI Chief Complaint  Patient presents with  . Follow-up    pt has had heart cath since last visit.  pt having no breathing complaints today.     History severe says that his breathing has improved significantly since the left heart catheterization. Specifically, he had his diuretic dose adjusted. He says ever since then he has been able to lay down flat and "stretch out" without having any problems with shortness of breath. No cough production but he has noted increasing acid reflux and heartburn symptoms.  Past Medical History  Diagnosis Date  . Type II or unspecified type diabetes mellitus without mention of complication, uncontrolled   . Hyperlipidemia   . Hypertensive heart disease   . Unspecified disorder of kidney and ureter   . Diastasis of muscle   . Elevated prostate specific antigen (PSA)   . GERD (gastroesophageal reflux disease)   . CAD (coronary artery disease)   . Aortic valve disease       Review of Systems     Objective:   Physical Exam Filed Vitals:   07/19/14 1639  BP: 116/66  Pulse: 88  Height: 5\' 6"  (1.676 m)  Weight: 203 lb (92.08 kg)  SpO2: 97%   RA  Gen: elderly but , well appearing HENT: OP clear, , neck supple PULM: CTA B, normal percussion CV: RRR, slight systolic murmur, trace edema GI: BS+, soft, nontender Derm: no cyanosis or rash Psyche: normal mood and affect    RHC: Right atrium: A = 13 V equals 9, mean equals 9  Right Ventricle: 58/7013 Pulmonary Artery: 58/35 Sat= 52% PCWP: A = 30 V =32 mean equals 30 Aorta 122/76 Sat= 93%  LV 130/25-30.      Assessment & Plan:   Shortness of breath Problem is resolved with increased dose of Lasix. Right heart catheterization showed increased left-sided filling  pressures suggested at least diastolic heart failure with reading is dyspnea. His chest x-ray and spirometry and oximetry exam are not with underlying lung disease.  At this time I do not recommend a further workup for his dyspnea because the problem has resolved.   Reflux esophagitis This problem has increased recently. Today we reviewed gastroesophageal reflux disease lifestyle modification changes.     Updated Medication List Outpatient Encounter Prescriptions as of 07/19/2014  Medication Sig  . aspirin 81 MG tablet Take 81 mg by mouth at bedtime.   . carvedilol (COREG) 3.125 MG tablet Take 1 tablet (3.125 mg total) by mouth 2 (two) times daily with a meal.  . furosemide (LASIX) 40 MG tablet Take one tablet by mouth once daily for swelling  . glimepiride (AMARYL) 2 MG tablet Take One tablet once daily with breakfast to control diabetes  . ibuprofen (ADVIL,MOTRIN) 200 MG tablet Take 200 mg by mouth 2 (two) times daily.  Marland Kitchen losartan-hydrochlorothiazide (HYZAAR) 100-25 MG per tablet Take one tablet by mouth once daily to control blood pressure  . metFORMIN (GLUCOPHAGE) 500 MG tablet Take 2 tablets (1,000 mg total) by mouth 2 (two) times daily with a meal.  . Multiple Vitamin (MULTIVITAMIN) tablet Take 1 tablet by mouth daily.   Marland Kitchen omeprazole (PRILOSEC) 40 MG capsule Take one capsule twice daily for acid reduction  . simvastatin (ZOCOR) 40 MG tablet Take one tablet by mouth once daily for cholesterol

## 2014-07-19 NOTE — Patient Instructions (Signed)
Follow the GERD diet information we gave you  F/u with Drs. Wynonia Lawman and [employer]

## 2014-07-19 NOTE — Assessment & Plan Note (Signed)
This problem has increased recently. Today we reviewed gastroesophageal reflux disease lifestyle modification changes.

## 2014-07-27 ENCOUNTER — Other Ambulatory Visit: Payer: Self-pay | Admitting: Internal Medicine

## 2014-09-04 ENCOUNTER — Other Ambulatory Visit: Payer: Self-pay | Admitting: Internal Medicine

## 2014-09-30 ENCOUNTER — Other Ambulatory Visit: Payer: Federal, State, Local not specified - PPO

## 2014-09-30 DIAGNOSIS — R0602 Shortness of breath: Secondary | ICD-10-CM

## 2014-09-30 DIAGNOSIS — IMO0002 Reserved for concepts with insufficient information to code with codable children: Secondary | ICD-10-CM

## 2014-09-30 DIAGNOSIS — I5022 Chronic systolic (congestive) heart failure: Secondary | ICD-10-CM

## 2014-09-30 DIAGNOSIS — E1129 Type 2 diabetes mellitus with other diabetic kidney complication: Secondary | ICD-10-CM

## 2014-09-30 DIAGNOSIS — E1165 Type 2 diabetes mellitus with hyperglycemia: Principal | ICD-10-CM

## 2014-09-30 DIAGNOSIS — N183 Chronic kidney disease, stage 3 unspecified: Secondary | ICD-10-CM

## 2014-10-01 LAB — COMPREHENSIVE METABOLIC PANEL
ALBUMIN: 4.4 g/dL (ref 3.5–4.7)
ALK PHOS: 57 IU/L (ref 39–117)
ALT: 22 IU/L (ref 0–44)
AST: 24 IU/L (ref 0–40)
Albumin/Globulin Ratio: 1.8 (ref 1.1–2.5)
BUN/Creatinine Ratio: 17 (ref 10–22)
BUN: 23 mg/dL (ref 8–27)
Bilirubin Total: 0.7 mg/dL (ref 0.0–1.2)
CHLORIDE: 96 mmol/L — AB (ref 97–108)
CO2: 28 mmol/L (ref 18–29)
Calcium: 9.4 mg/dL (ref 8.6–10.2)
Creatinine, Ser: 1.35 mg/dL — ABNORMAL HIGH (ref 0.76–1.27)
GFR calc Af Amer: 55 mL/min/{1.73_m2} — ABNORMAL LOW (ref >59)
GFR calc non Af Amer: 48 mL/min/{1.73_m2} — ABNORMAL LOW (ref >59)
GLOBULIN, TOTAL: 2.5 g/dL (ref 1.5–4.5)
Glucose: 125 mg/dL — ABNORMAL HIGH (ref 65–99)
Potassium: 3.5 mmol/L (ref 3.5–5.2)
SODIUM: 141 mmol/L (ref 134–144)
Total Protein: 6.9 g/dL (ref 6.0–8.5)

## 2014-10-01 LAB — BRAIN NATRIURETIC PEPTIDE: BNP: 1076.5 pg/mL — ABNORMAL HIGH (ref 0.0–100.0)

## 2014-10-01 LAB — HEMOGLOBIN A1C
Est. average glucose Bld gHb Est-mCnc: 128 mg/dL
HEMOGLOBIN A1C: 6.1 % — AB (ref 4.8–5.6)

## 2014-10-06 ENCOUNTER — Encounter: Payer: Self-pay | Admitting: Internal Medicine

## 2014-10-06 ENCOUNTER — Ambulatory Visit (INDEPENDENT_AMBULATORY_CARE_PROVIDER_SITE_OTHER): Payer: Federal, State, Local not specified - PPO | Admitting: Internal Medicine

## 2014-10-06 VITALS — BP 120/74 | HR 98 | Temp 97.4°F | Resp 20 | Ht 66.0 in | Wt 191.4 lb

## 2014-10-06 DIAGNOSIS — C44310 Basal cell carcinoma of skin of unspecified parts of face: Secondary | ICD-10-CM

## 2014-10-06 DIAGNOSIS — N183 Chronic kidney disease, stage 3 unspecified: Secondary | ICD-10-CM

## 2014-10-06 DIAGNOSIS — E785 Hyperlipidemia, unspecified: Secondary | ICD-10-CM

## 2014-10-06 DIAGNOSIS — E1165 Type 2 diabetes mellitus with hyperglycemia: Secondary | ICD-10-CM

## 2014-10-06 DIAGNOSIS — L570 Actinic keratosis: Secondary | ICD-10-CM | POA: Insufficient documentation

## 2014-10-06 DIAGNOSIS — R413 Other amnesia: Secondary | ICD-10-CM

## 2014-10-06 DIAGNOSIS — E1129 Type 2 diabetes mellitus with other diabetic kidney complication: Secondary | ICD-10-CM | POA: Diagnosis not present

## 2014-10-06 DIAGNOSIS — IMO0002 Reserved for concepts with insufficient information to code with codable children: Secondary | ICD-10-CM

## 2014-10-06 DIAGNOSIS — I2510 Atherosclerotic heart disease of native coronary artery without angina pectoris: Secondary | ICD-10-CM | POA: Diagnosis not present

## 2014-10-06 DIAGNOSIS — I5022 Chronic systolic (congestive) heart failure: Secondary | ICD-10-CM

## 2014-10-06 NOTE — Progress Notes (Signed)
Patient ID: Jimmy Dawson, male   DOB: 08-08-1928, 81 y.o.   MRN: [id number]    Facility  [facility]    Place of Service:   OFFICE    No Known Allergies  Chief Complaint  Patient presents with  . Follow-up    spot on left ear to check,    HPI:  Scaling lesion of the left pinna. No bleeding. History of removal of basal cell cancer of the nose in the past.  Gives a history of having had nausea and loose stools about 2 weeks ago. This seems to have completely cleared up.  Diabetes under good control. Patient has known CKD 3 related to his diabetes.  Patient has had increasing shortness of breath the last 2 weeks. He has seen Dr. Wynonia Lawman. BNP was 1076. He was started on vabradine.  Medications: Patient's Medications  New Prescriptions   No medications on file  Previous Medications   ASPIRIN 81 MG TABLET    Take 81 mg by mouth at bedtime.    CARVEDILOL (COREG) 3.125 MG TABLET    Take 1 tablet (3.125 mg total) by mouth 2 (two) times daily with a meal.   FUROSEMIDE (LASIX) 40 MG TABLET    Take one tablet by mouth once daily for swelling   GLIMEPIRIDE (AMARYL) 2 MG TABLET    TAKE 1 TABLET BY MOUTH EVERY DAY WITH BREAKFAST TO CONTROL DIABETES   IBUPROFEN (ADVIL,MOTRIN) 200 MG TABLET    Take 200 mg by mouth 2 (two) times daily.   METFORMIN (GLUCOPHAGE) 500 MG TABLET    Take 2 tablets (1,000 mg total) by mouth 2 (two) times daily with a meal.   MULTIPLE VITAMIN (MULTIVITAMIN) TABLET    Take 1 tablet by mouth daily.    OMEPRAZOLE (PRILOSEC) 40 MG CAPSULE    TAKE ONE CAPSULE BY MOUTH TWICE A DAY FOR ACID REDUCTION   SIMVASTATIN (ZOCOR) 40 MG TABLET    Take one tablet by mouth once daily for cholesterol  Modified Medications   No medications on file  Discontinued Medications   GLIMEPIRIDE (AMARYL) 2 MG TABLET    Take One tablet once daily with breakfast to control diabetes   LOSARTAN-HYDROCHLOROTHIAZIDE (HYZAAR) 100-25 MG PER TABLET    Take one tablet by mouth once daily to control blood  pressure     Review of Systems  Constitutional: Negative.  Negative for fever, chills, diaphoresis, activity change, appetite change and fatigue.  HENT: Positive for hearing loss. Negative for congestion, ear pain, nosebleeds, rhinorrhea, sinus pressure and tinnitus.        Wears prescription lenses. Otherwise negative in regards to symptoms. Wax in both EAC and impacted in right ear.  Eyes:       Wears prescription lenses. Negative for all other symptoms.  Respiratory: Negative for cough, choking, chest tightness, shortness of breath and wheezing.   Cardiovascular: Negative for chest pain, palpitations and leg swelling.  Gastrointestinal: Negative for abdominal pain, diarrhea, constipation and blood in stool.  Endocrine: Negative for cold intolerance, heat intolerance, polydipsia, polyphagia and polyuria.  Genitourinary: Negative for dysuria, urgency, frequency, flank pain, decreased urine volume, enuresis and difficulty urinating.  Musculoskeletal: Negative for myalgias, back pain, arthralgias and gait problem.  Skin:       Itching has resolved.  Allergic/Immunologic: Negative.   Neurological: Negative for dizziness, tremors, speech difficulty, weakness, light-headedness, numbness and headaches.       Patient says he is aware that his memory is slipping. 06/30/2014 MMSE 30/30. Passed  clock drawing.  Hematological: Negative.   Psychiatric/Behavioral: Negative.     Filed Vitals:   10/06/14 1422  BP: 120/74  Pulse: 98  Temp: 97.4 F (36.3 C)  TempSrc: Oral  Resp: 20  Height: 5\' 6"  (1.676 m)  Weight: 191 lb 6.4 oz (86.818 kg)  SpO2: 92%   Body mass index is 30.91 kg/(m^2).  Physical Exam  Constitutional: He is oriented to person, place, and time. He appears well-developed and well-nourished.  HENT:  Significant hearing loss bilaterally. Interferes with conversation.  Eyes:  Wears prescription lenses, otherwise normal.  Neck: Neck supple. No JVD present. No tracheal  deviation present. No thyromegaly present.  Cardiovascular: Normal rate, regular rhythm, normal heart sounds and intact distal pulses.  Exam reveals no gallop.   No murmur heard. Pulmonary/Chest: Breath sounds normal. No respiratory distress. He has no wheezes. He has no rales. He exhibits no tenderness.  Abdominal: Soft. Bowel sounds are normal. He exhibits no mass. There is no tenderness.  Musculoskeletal: Normal range of motion.  Chronic back discomfort with movement.  Lymphadenopathy:    He has no cervical adenopathy.  Neurological: He is alert and oriented to person, place, and time. No cranial nerve deficit. Coordination normal.  Wife has reported issues with his memory in the past. He now says that he is aware of some memory slippage. He does not feel that it incapacitates him in any way.  Skin: Skin is warm and dry. No rash noted. No erythema.  Lesion on the right shoulder that may be a ringworm.  Psychiatric: He has a normal mood and affect. His behavior is normal. Judgment and thought content normal.     Labs reviewed: Appointment on 09/30/2014  Component Date Value Ref Range Status  . Hgb A1c MFr Bld 09/30/2014 6.1* 4.8 - 5.6 % Final   Comment:          Pre-diabetes: 5.7 - 6.4          Diabetes: >6.4          Glycemic control for adults with diabetes: <7.0   . Est. average glucose Bld gHb Est-m* 09/30/2014 128   Final  . Glucose 09/30/2014 125* 65 - 99 mg/dL Final  . BUN 09/30/2014 23  8 - 27 mg/dL Final  . Creatinine, Ser 09/30/2014 1.35* 0.76 - 1.27 mg/dL Final  . GFR calc non Af Amer 09/30/2014 48* >59 mL/min/1.73 Final  . GFR calc Af Amer 09/30/2014 55* >59 mL/min/1.73 Final  . BUN/Creatinine Ratio 09/30/2014 17  10 - 22 Final  . Sodium 09/30/2014 141  134 - 144 mmol/L Final  . Potassium 09/30/2014 3.5  3.5 - 5.2 mmol/L Final  . Chloride 09/30/2014 96* 97 - 108 mmol/L Final  . CO2 09/30/2014 28  18 - 29 mmol/L Final  . Calcium 09/30/2014 9.4  8.6 - 10.2 mg/dL Final    . Total Protein 09/30/2014 6.9  6.0 - 8.5 g/dL Final  . Albumin 09/30/2014 4.4  3.5 - 4.7 g/dL Final  . Globulin, Total 09/30/2014 2.5  1.5 - 4.5 g/dL Final  . Albumin/Globulin Ratio 09/30/2014 1.8  1.1 - 2.5 Final  . Bilirubin Total 09/30/2014 0.7  0.0 - 1.2 mg/dL Final  . Alkaline Phosphatase 09/30/2014 57  39 - 117 IU/L Final  . AST 09/30/2014 24  0 - 40 IU/L Final  . ALT 09/30/2014 22  0 - 44 IU/L Final  . BNP 09/30/2014 1076.5* 0.0 - 100.0 pg/mL Final     Assessment/Plan  1. Stage III chronic kidney disease - Basic metabolic panel; Future  2. DM (diabetes mellitus), type 2, uncontrolled, with renal complications - Hemoglobin A1c; Future - Basic metabolic panel; Future  3. Chronic systolic heart failure Appears to be improved with the addition of vabradine  4. Coronary artery disease involving native coronary artery of native heart without angina pectoris Stable  5. Mild memory disturbance Does not appear to have any further decline. Wife continues to worry a lot about him.  6. BCC (basal cell carcinoma), face No recurrence of nasal basal cell cancer that has been  7. Actinic keratosis Possible SK or AK of the left pinna  8. Hyperlipidemia - Lipid panel; Future

## 2014-10-19 ENCOUNTER — Other Ambulatory Visit: Payer: Self-pay

## 2014-10-19 MED ORDER — GLIMEPIRIDE 2 MG PO TABS: ORAL_TABLET | ORAL | Status: DC

## 2014-10-26 ENCOUNTER — Encounter: Payer: Self-pay | Admitting: Cardiology

## 2014-10-26 DIAGNOSIS — I5022 Chronic systolic (congestive) heart failure: Secondary | ICD-10-CM

## 2014-10-26 NOTE — Progress Notes (Signed)
Patient ID: Jimmy Dawson, male   DOB: 10/11/1928, 81 y.o.   MRN: [id number]   Jimmy Dawson, Jimmy Dawson    Date of visit:  10/26/2014 DOB:  04-09-1928    Age:  81 yrs. Medical record number:  [id number]     Account number:  [id number] Primary Care Provider: GREEN III, ARTHUR G ____________________________ CURRENT DIAGNOSES  1. Dyspnea  2. Chronic systolic heart failure  3. Hypertensive heart disease without heart failure  4. CAD Native without angina  5. Aortic valve disorder, unspecified  6. Left bundle-branch block  7. Hyperlipidemia  8. Chronic kidney disease, stage 3 (moderate)  9. Type 2 diabetes mellitus without complications  10. Presence of coronary angioplasty implant and graft  11. Gastro-esophageal reflux disease without esophagitis ____________________________ ALLERGIES  No Known Allergies ____________________________ MEDICATIONS  1. omeprazole 40 mg Capsule, Delayed Release(E.C.), 1 p.o. daily  2. multivitamin Tablet, 1 p.o. daily  3. simvastatin 40 mg tablet, 1 p.o. daily  4. Metamucil 3.4 gram/12 gram Powder, qd  5. aspirin 81 mg chewable tablet, 1 p.o. daily  6. metformin 500 mg tablet, 2 p.o. b.i.d.  7. glimepiride 2 mg tablet, 1 p.o. daily  8. carvedilol 3.125 mg tablet, BID  9. furosemide 40 mg tablet, 1 p.o. daily  10. Colace 100 mg capsule, PRN  11. ibuprofen 200 mg tablet, PRN  12. Corlanor 5 mg tablet, BID  13. Entresto 24 mg-26 mg tablet, BID ____________________________ CHIEF COMPLAINTS  Followup of Aortic valve disorder, unspecified  Followup of CAD Native without angina ____________________________ HISTORY OF PRESENT ILLNESS Patient seen for cardiac followup. He has lost 7 pounds since he was here and has reduced his furosemide packed once daily. Starting the Corlanor he feel significantly better with improvement in overall sense of well-being. His EKG today shows resolution of the left bundle branch block and his pulses dropped to 94. His edema was better  earlier in the week but has recurred some nail. He denies PND, orthopnea, syncope, or claudication. ____________________________ PAST HISTORY  Past Medical Illnesses:  hypertension, DM-non-insulin dependent, hyperlipidemia, obesity, GERD, BPH, TIA;  Cardiovascular Illnesses:  CAD, aortic sclerosis;  Surgical Procedures:  TURP;  NYHA Classification:  II;  Canadian Angina Classification:  Class 0: Asymptomatic;  Cardiology Procedures-Invasive:  Penta stent February 2002 Dr. Glade Lloyd, cardiac cath (left) February 2016;  Cardiology Procedures-Noninvasive:  treadmill cardiolite December 2009, echocardiogram July 2011, echocardiogram July 2013, echocardiogram July 2015, echocardiogram January 2016, lexiscan cardiolite February 2016;  Cardiac Cath Results:  40% ostiall Left main, 40% proximal LAD, patent stent mid LAD, 95% stenosis proximal Diag 1, scattered irregularities CFX, scattered irregularities RCA;  LVEF of 25% documented via nuclear study on 05/06/2014,   ____________________________ CARDIO-PULMONARY TEST DATES EKG Date:  10/26/2014;   Cardiac Cath Date:  05/24/2014;  Stent Placement Date: 05/08/2000;  Nuclear Study Date:  05/06/2014;  Echocardiography Date: 04/19/2014;  Chest Xray Date: 06/27/2007;   ____________________________ FAMILY HISTORY Brother -- Brother dead, Myocardial infarction Brother -- Brother dead, Unknown disease Brother -- Brother dead, Unknown disease Father -- Father dead, Myocardial infarction Mother -- Mother dead, Diabetes mellitus, Coronary Artery Disease ____________________________ SOCIAL HISTORY Alcohol Use:  does not use alcohol;  Smoking:  nonsmoker;  Diet:  regular diet;  Lifestyle:  widower and remarried 12 years;  Exercise:  no regular exercise;  Occupation:  [employer]-retired;  Residence:  lives with wife;   ____________________________ REVIEW OF SYSTEMS General:  malaise and fatigue, weight loss of approximately 5 lbs  Integumentary:recent skin cancer Eyes:  decreased acuity O.D. Respiratory: denies dyspnea, cough, wheezing or hemoptysis. Cardiovascular:  please review HPI Abdominal: denies dyspepsia, GI bleeding, constipation, or diarrhea Genitourinary-Male: frequency, nocturia  Musculoskeletal:  chronic low back pain  ____________________________ PHYSICAL EXAMINATION VITAL SIGNS  Blood Pressure:  144/86 Sitting, Left arm, regular cuff  , 148/90 Standing, Left arm and regular cuff   Pulse:  100/min. Weight:  189.00 lbs. Height:  69"BMI: 28  Constitutional:  pleasant white male in no acute distress, mildly obese Skin:  warm and dry to touch, no apparent skin lesions, or masses noted. Head:  normocephalic, normal hair pattern, no masses or tenderness Eyes:  strabismus Neck:  supple, without massess. No JVD, thyromegaly or carotid bruits. Carotid upstroke normal. Chest:  normal symmetry, clear to auscultation. Cardiac:  regular rhythm, normal S1 and S2, no murmur, grade 1/6 systolic murmur at aortic area radiating to neck Peripheral Pulses:  femoral pulses 2+, posterior tibial pulses 3+ Extremities & Back:  mild bilateral venous insufficiency changes present, 1 + edema present Neurological:  no gross motor or sensory deficits noted, affect appropriate, oriented x3. ____________________________ MOST RECENT LIPID PANEL 04/26/14  CHOL TOTL 146 mg/dl, LDL 84 NM, HDL 40 mg/dl, TRIGLYCER 108 mg/dl and CHOL/HDL 3.7 (Calc) ____________________________ IMPRESSIONS/PLAN  1. Chronic systolic heart failure clinically improved 2. Stage III chronic kidney disease 3. Intermittent left bundle branch block 4. Mild-to-moderate aortic stenosis 5. Coronary artery disease moderate  Recommendations: He is clinically quite improved. Continue his current dose of Corlanor and added Entresto at the lowest dose twice daily. Lab work checked again today. Followup in 2 weeks and consider titration of Entresto then. EKG shows heart rate of 94.   ____________________________ TODAYS ORDERS  1. 12 Lead EKG: Today  2. Basic Metabolic Panel: Today  3. Return Visit: 2 weeks                       ____________________________ Cardiology Physician:  Kerry Hough MD [facility]

## 2014-10-31 ENCOUNTER — Other Ambulatory Visit: Payer: Self-pay | Admitting: Internal Medicine

## 2014-11-26 ENCOUNTER — Other Ambulatory Visit: Payer: Self-pay | Admitting: Internal Medicine

## 2014-11-26 ENCOUNTER — Other Ambulatory Visit: Payer: Self-pay | Admitting: Nurse Practitioner

## 2014-12-01 ENCOUNTER — Other Ambulatory Visit: Payer: Self-pay | Admitting: Internal Medicine

## 2014-12-14 ENCOUNTER — Encounter: Payer: Self-pay | Admitting: Cardiology

## 2014-12-14 NOTE — Progress Notes (Signed)
Patient ID: Jimmy Dawson, male   DOB: 08/14/1928, 81 y.o.   MRN: [id number]   Daire, Okimoto    Date of visit:  12/14/2014 DOB:  09-20-1928    Age:  81 yrs. Medical record number:  [id number]     Account number:  [id number] Primary Care Provider: GREEN III, ARTHUR G ____________________________ CURRENT DIAGNOSES  1. Dyspnea  2. Chronic systolic heart failure  3. Hypertensive heart disease without heart failure  4. CAD Native without angina  5. Aortic valve disorder, unspecified  6. Left bundle-branch block  7. Hyperlipidemia  8. Chronic kidney disease, stage 3 (moderate)  9. Type 2 diabetes mellitus without complications  10. Presence of coronary angioplasty implant and graft  11. Gastro-esophageal reflux disease without esophagitis ____________________________ ALLERGIES  No Known Allergies ____________________________ MEDICATIONS  1. omeprazole 40 mg Capsule, Delayed Release(E.C.), 1 p.o. daily  2. multivitamin Tablet, 1 p.o. daily  3. simvastatin 40 mg tablet, 1 p.o. daily  4. Metamucil 3.4 gram/12 gram Powder, qd  5. aspirin 81 mg chewable tablet, 1 p.o. daily  6. metformin 500 mg tablet, 2 p.o. b.i.d.  7. glimepiride 2 mg tablet, 1 p.o. daily  8. carvedilol 3.125 mg tablet, BID  9. furosemide 40 mg tablet, 1 p.o. daily  10. Colace 100 mg capsule, PRN  11. ibuprofen 200 mg tablet, PRN  12. Corlanor 5 mg tablet, BID  13. Entresto 49 mg-51 mg tablet, BID ____________________________ CHIEF COMPLAINTS  Followup of CAD Native without angina  Followup of Chronic systolic heart failure ____________________________ HISTORY OF PRESENT ILLNESS Patient seen for cardiac followup. He has tolerated titration of his medications and is really getting along fairly well. He has gained additional weight and states it is hard to keep the weight down at times. He will take extra Lasix if he has edema or the weight goes up. He has only mild dyspnea with exertion at this time. No angina. He  denies PND, orthopnea, syncope, or claudication. ____________________________ PAST HISTORY  Past Medical Illnesses:  hypertension, DM-non-insulin dependent, hyperlipidemia, obesity, GERD, BPH, TIA;  Cardiovascular Illnesses:  CAD, aortic sclerosis;  Surgical Procedures:  TURP;  NYHA Classification:  II;  Canadian Angina Classification:  Class 0: Asymptomatic;  Cardiology Procedures-Invasive:  Penta stent February 2002 Dr. Glade Lloyd, cardiac cath (left) February 2016;  Cardiology Procedures-Noninvasive:  treadmill cardiolite December 2009, echocardiogram July 2011, echocardiogram July 2013, echocardiogram July 2015, echocardiogram January 2016, lexiscan cardiolite February 2016;  Cardiac Cath Results:  40% ostiall Left main, 40% proximal LAD, patent stent mid LAD, 95% stenosis proximal Diag 1, scattered irregularities CFX, scattered irregularities RCA;  LVEF of 25% documented via nuclear study on 05/06/2014,   ____________________________ CARDIO-PULMONARY TEST DATES EKG Date:  10/26/2014;   Cardiac Cath Date:  05/24/2014;  Stent Placement Date: 05/08/2000;  Nuclear Study Date:  05/06/2014;  Echocardiography Date: 04/19/2014;  Chest Xray Date: 06/27/2007;   ____________________________ FAMILY HISTORY Brother -- Brother dead, Myocardial infarction Brother -- Brother dead, Unknown disease Brother -- Brother dead, Unknown disease Father -- Father dead, Myocardial infarction Mother -- Mother dead, Diabetes mellitus, Coronary Artery Disease ____________________________ SOCIAL HISTORY Alcohol Use:  does not use alcohol;  Smoking:  nonsmoker;  Diet:  regular diet;  Lifestyle:  widower and remarried 12 years;  Exercise:  no regular exercise;  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 Respiratory: denies dyspnea,  cough,  wheezing or hemoptysis. Cardiovascular:  please review HPI Abdominal: denies dyspepsia, GI bleeding, constipation, or diarrhea Genitourinary-Male: frequency, nocturia  Musculoskeletal:  chronic low back pain  ____________________________ PHYSICAL EXAMINATION VITAL SIGNS  Blood Pressure:  124/70 Sitting, Right arm, regular cuff  , 120/70 Standing, Right arm and regular cuff   Pulse:  92/min. Weight:  188.00 lbs. Height:  69"BMI: 28  Constitutional:  pleasant white male in no acute distress, mildly obese Skin:  warm and dry to touch, no apparent skin lesions, or masses noted. Head:  normocephalic, normal hair pattern, no masses or tenderness Eyes:  strabismus Neck:  supple, without massess. No JVD, thyromegaly or carotid bruits. Carotid upstroke normal. Chest:  normal symmetry, clear to auscultation. Cardiac:  regular rhythm, normal S1 and S2, no murmur, grade 1/6 systolic murmur at aortic area radiating to neck Peripheral Pulses:  femoral pulses 2+, posterior tibial pulses 3+ Extremities & Back:  mild bilateral venous insufficiency changes present, 1 + edema present Neurological:  no gross motor or sensory deficits noted, affect appropriate, oriented x3. ____________________________ MOST RECENT LIPID PANEL 04/26/14  CHOL TOTL 146 mg/dl, LDL 84 NM, HDL 40 mg/dl, TRIGLYCER 108 mg/dl and CHOL/HDL 3.7 (Calc) ____________________________ IMPRESSIONS/PLAN  1. Cardiomyopathy 2. Left bundle-branch block 3. Mild to moderate aortic stenosis 4. Coronary artery disease-stable 5. Stage III chronic kidney disease  Recommendations:  Obtain metabolic panel today. Based on blood pressure will leave medications at current dose. The tachycardia that he had previously has improved and overall he is much better than earlier in the year. Followup in 3 months with repeat echo to determine if he has had any interval improvement in LV function. ____________________________ TODAYS ORDERS  1. Return Visit: 3  months  2. 2D, color flow, doppler: 3 months  3. Basic Metabolic Panel: Today                       ____________________________ Cardiology Physician:  Kerry Hough MD [facility]

## 2015-01-05 ENCOUNTER — Telehealth: Payer: Self-pay | Admitting: *Deleted

## 2015-01-05 ENCOUNTER — Emergency Department (HOSPITAL_COMMUNITY)
Admission: EM | Admit: 2015-01-05 | Discharge: 2015-01-05 | Disposition: A | Discharge status: Home / Self Care | Payer: Federal, State, Local not specified - PPO | Attending: Emergency Medicine | Admitting: Emergency Medicine

## 2015-01-05 DIAGNOSIS — E785 Hyperlipidemia, unspecified: Secondary | ICD-10-CM | POA: Diagnosis not present

## 2015-01-05 DIAGNOSIS — Z87448 Personal history of other diseases of urinary system: Secondary | ICD-10-CM | POA: Diagnosis not present

## 2015-01-05 DIAGNOSIS — X58XXXA Exposure to other specified factors, initial encounter: Secondary | ICD-10-CM | POA: Insufficient documentation

## 2015-01-05 DIAGNOSIS — Y998 Other external cause status: Secondary | ICD-10-CM | POA: Insufficient documentation

## 2015-01-05 DIAGNOSIS — S80821A Blister (nonthermal), right lower leg, initial encounter: Secondary | ICD-10-CM | POA: Insufficient documentation

## 2015-01-05 DIAGNOSIS — I2510 Atherosclerotic heart disease of native coronary artery without angina pectoris: Secondary | ICD-10-CM | POA: Diagnosis not present

## 2015-01-05 DIAGNOSIS — K219 Gastro-esophageal reflux disease without esophagitis: Secondary | ICD-10-CM | POA: Diagnosis not present

## 2015-01-05 DIAGNOSIS — T148XXA Other injury of unspecified body region, initial encounter: Secondary | ICD-10-CM

## 2015-01-05 DIAGNOSIS — Y9389 Activity, other specified: Secondary | ICD-10-CM | POA: Diagnosis not present

## 2015-01-05 DIAGNOSIS — Z87891 Personal history of nicotine dependence: Secondary | ICD-10-CM | POA: Diagnosis not present

## 2015-01-05 DIAGNOSIS — I119 Hypertensive heart disease without heart failure: Secondary | ICD-10-CM | POA: Diagnosis not present

## 2015-01-05 DIAGNOSIS — Z79899 Other long term (current) drug therapy: Secondary | ICD-10-CM | POA: Diagnosis not present

## 2015-01-05 DIAGNOSIS — Y9289 Other specified places as the place of occurrence of the external cause: Secondary | ICD-10-CM | POA: Diagnosis not present

## 2015-01-05 DIAGNOSIS — Z7982 Long term (current) use of aspirin: Secondary | ICD-10-CM | POA: Diagnosis not present

## 2015-01-05 DIAGNOSIS — E119 Type 2 diabetes mellitus without complications: Secondary | ICD-10-CM | POA: Insufficient documentation

## 2015-01-05 LAB — CBC
HCT: 35.1 % — ABNORMAL LOW (ref 39.0–52.0)
Hemoglobin: 10.6 g/dL — ABNORMAL LOW (ref 13.0–17.0)
MCH: 24.7 pg — ABNORMAL LOW (ref 26.0–34.0)
MCHC: 30.2 g/dL (ref 30.0–36.0)
MCV: 81.8 fL (ref 78.0–100.0)
PLATELETS: 226 10*3/uL (ref 150–400)
RBC: 4.29 MIL/uL (ref 4.22–5.81)
RDW: 17.1 % — ABNORMAL HIGH (ref 11.5–15.5)
WBC: 7 10*3/uL (ref 4.0–10.5)

## 2015-01-05 LAB — I-STAT CHEM 8, ED
BUN: 29 mg/dL — ABNORMAL HIGH (ref 6–20)
CHLORIDE: 97 mmol/L — AB (ref 101–111)
Calcium, Ion: 1.19 mmol/L (ref 1.13–1.30)
Creatinine, Ser: 1.5 mg/dL — ABNORMAL HIGH (ref 0.61–1.24)
GLUCOSE: 202 mg/dL — AB (ref 65–99)
HCT: 37 % — ABNORMAL LOW (ref 39.0–52.0)
Hemoglobin: 12.6 g/dL — ABNORMAL LOW (ref 13.0–17.0)
Potassium: 3.5 mmol/L (ref 3.5–5.1)
SODIUM: 142 mmol/L (ref 135–145)
TCO2: 29 mmol/L (ref 0–100)

## 2015-01-05 MED ORDER — SULFAMETHOXAZOLE-TRIMETHOPRIM 800-160 MG PO TABS: 2.0000 | ORAL_TABLET | Freq: Two times a day (BID) | ORAL | Status: DC

## 2015-01-05 MED ORDER — CEPHALEXIN 500 MG PO CAPS: 1000.0000 mg | ORAL_CAPSULE | Freq: Two times a day (BID) | ORAL | Status: DC

## 2015-01-05 NOTE — ED Notes (Signed)
Pt ambulated with slow steady gait and stand by assist to the restroom.

## 2015-01-05 NOTE — ED Notes (Signed)
Patient was in yard working with boots, when removed boots, large red area. Patient thinks it was a spider bite.

## 2015-01-05 NOTE — Telephone Encounter (Signed)
Patient's wife called regarding husband has been bitten by a recluse spider on his lower right leg , she stated that the bite is looking bad this morning. I spoke with Dr. Gildardo Cranker regarding this bite, and she stated that the patient needs to go straight to the emergency room he may need an antivenom medication.

## 2015-01-05 NOTE — Discharge Instructions (Signed)
Return here as needed.  Follow-up with the wound care center follow-up with your primary care doctor, as well

## 2015-01-05 NOTE — ED Provider Notes (Signed)
CSN: [id number]     Arrival date & time 01/05/15  1143 History   First MD Initiated Contact with Patient 01/05/15 1328     Chief Complaint  Patient presents with  . Insect Bite     (Consider location/radiation/quality/duration/timing/severity/associated sxs/prior Treatment) HPI Patient presents to the emergency department with a wound to the right lateral lower leg.  Patient states he was working outside with rubber boots and noticed a large blistered area that is formed after taking a boots.  Patient states that the area is somewhat painful but not significantly painful.  He states that he has been keeping the area clean and dry.  Using Neosporin with bandages.  Patient states that nothing seems make sure it better or worse.  Patient denies fever, nausea, vomiting, weakness, dizziness, headache, blurred vision, incontinence, dysuria, abdominal pain, chest pain, shortness breath or syncope. Past Medical History  Diagnosis Date  . Type II or unspecified type diabetes mellitus without mention of complication, uncontrolled   . Hyperlipidemia   . Hypertensive heart disease   . Unspecified disorder of kidney and ureter   . Diastasis of muscle   . Elevated prostate specific antigen (PSA)   . GERD (gastroesophageal reflux disease)   . CAD (coronary artery disease)   . Aortic valve disease    Past Surgical History  Procedure Laterality Date  . Finger surgery  1955    RIGHT INDEX AND RIGHT MIDDLE FINGER  . Sp intro uret cath perc  03-1999  . Coronary angioplasty with stent placement  05-2000  . Left and right heart catheterization with coronary angiogram N/A 05/24/2014    Procedure: LEFT AND RIGHT HEART CATHETERIZATION WITH CORONARY ANGIOGRAM;  Surgeon: Jacolyn Reedy, MD;  Location: [facility] CATH LAB;  Service: Cardiovascular;  Laterality: N/A;   Family History  Problem Relation Age of Onset  . Heart disease Mother   . Diabetes Mother   . Heart disease Father   . Heart attack Father   .  Heart disease Sister   . Diabetes Sister   . Heart disease Brother   . Diabetes Brother   . Diabetes Brother   . Heart disease Brother   . Diabetes Brother   . Heart disease Brother    Social History  Substance Use Topics  . Smoking status: Former Smoker -- 0.50 packs/day for 1 years    Types: Cigarettes    Quit date: 04/02/1968  . Smokeless tobacco: Never Used  . Alcohol Use: No    Review of Systems All other systems negative except as documented in the HPI. All pertinent positives and negatives as reviewed in the HPI.   Allergies  Review of patient's allergies indicates no known allergies.  Home Medications   Prior to Admission medications   Medication Sig Start Date End Date Taking? Authorizing Provider  aspirin 81 MG tablet Take 81 mg by mouth at bedtime.     Historical Provider, MD  carvedilol (COREG) 3.125 MG tablet TAKE 1 TABLET (3.125 MG TOTAL) BY MOUTH 2 (TWO) TIMES DAILY WITH A MEAL. 12/01/14   Estill Dooms, MD  furosemide (LASIX) 40 MG tablet Take one tablet by mouth once daily for swelling    Historical Provider, MD  glimepiride (AMARYL) 2 MG tablet TAKE 1 TABLET BY MOUTH EVERY DAY WITH BREAKFAST TO CONTROL DIABETES 10/19/14   Estill Dooms, MD  ibuprofen (ADVIL,MOTRIN) 200 MG tablet Take 200 mg by mouth 2 (two) times daily.    Historical Provider, MD  losartan-hydrochlorothiazide (  HYZAAR) 100-25 MG per tablet TAKE ONE TABLET BY MOUTH ONCE DAILY TO CONTROL BLOOD PRESSURE 11/26/14   Estill Dooms, MD  metFORMIN (GLUCOPHAGE) 500 MG tablet TAKE 2 TABLETS (1000MG ) BY MOUTH TWICE A DAY WITH MEALS 11/01/14   Estill Dooms, MD  Multiple Vitamin (MULTIVITAMIN) tablet Take 1 tablet by mouth daily.     Historical Provider, MD  omeprazole (PRILOSEC) 40 MG capsule TAKE ONE CAPSULE BY MOUTH TWICE A DAY FOR ACID REDUCTION 09/06/14   Estill Dooms, MD  simvastatin (ZOCOR) 40 MG tablet TAKE ONE TABLET BY MOUTH ONCE DAILY FOR CHOLESTEROL 11/26/14   Estill Dooms, MD   BP 104/79 mmHg   Pulse 97  Temp(Src) 98.2 F (36.8 C) (Oral)  Ht 5\' 6"  (1.676 m)  Wt 192 lb (87.091 kg)  BMI 31.00 kg/m2  SpO2 95% Physical Exam  Constitutional: He is oriented to person, place, and time. He appears well-developed and well-nourished. No distress.  HENT:  Head: Normocephalic and atraumatic.  Mouth/Throat: Oropharynx is clear and moist.  Eyes: Pupils are equal, round, and reactive to light.  Neck: Normal range of motion. Neck supple.  Cardiovascular: Normal rate, regular rhythm and normal heart sounds.  Exam reveals no gallop and no friction rub.   No murmur heard. Pulmonary/Chest: Effort normal and breath sounds normal. No respiratory distress.  Musculoskeletal: He exhibits no edema.       Legs: Neurological: He is alert and oriented to person, place, and time. He exhibits normal muscle tone. Coordination normal.  Skin: Skin is warm and dry. No rash noted. There is erythema.  Nursing note and vitals reviewed.   ED Course  Procedures (including critical care time) Labs Review Labs Reviewed  CBC - Abnormal; Notable for the following:    Hemoglobin 10.6 (*)    HCT 35.1 (*)    MCH 24.7 (*)    RDW 17.1 (*)    All other components within normal limits  I-STAT CHEM 8, ED - Abnormal; Notable for the following:    Chloride 97 (*)    BUN 29 (*)    Creatinine, Ser 1.50 (*)    Glucose, Bld 202 (*)    Hemoglobin 12.6 (*)    HCT 37.0 (*)    All other components within normal limits    Imaging Review No results found. I have personally reviewed and evaluated these images and lab results as part of my medical decision-making.  Patient be treated with Keflex and Bactrim.  Told to follow up with his primary care doctor got him an appointment at the wound care center.  Patient is advised to the area clean and dry.  Told to return here for any worsening in his condition   Dalia Heading, PA-C 01/05/15 [place], MD 01/05/15 [phone]

## 2015-02-07 ENCOUNTER — Other Ambulatory Visit: Payer: Federal, State, Local not specified - PPO

## 2015-02-09 ENCOUNTER — Ambulatory Visit: Payer: Federal, State, Local not specified - PPO | Admitting: Internal Medicine

## 2015-02-09 NOTE — Telephone Encounter (Signed)
Error encounter. 

## 2015-02-28 ENCOUNTER — Other Ambulatory Visit: Payer: Federal, State, Local not specified - PPO

## 2015-02-28 DIAGNOSIS — IMO0002 Reserved for concepts with insufficient information to code with codable children: Secondary | ICD-10-CM

## 2015-02-28 DIAGNOSIS — N183 Chronic kidney disease, stage 3 unspecified: Secondary | ICD-10-CM

## 2015-02-28 DIAGNOSIS — E785 Hyperlipidemia, unspecified: Secondary | ICD-10-CM

## 2015-02-28 DIAGNOSIS — E1165 Type 2 diabetes mellitus with hyperglycemia: Principal | ICD-10-CM

## 2015-02-28 DIAGNOSIS — E1129 Type 2 diabetes mellitus with other diabetic kidney complication: Secondary | ICD-10-CM

## 2015-03-01 LAB — BASIC METABOLIC PANEL
BUN/Creatinine Ratio: 21 (ref 10–22)
BUN: 28 mg/dL — AB (ref 8–27)
CALCIUM: 9.2 mg/dL (ref 8.6–10.2)
CHLORIDE: 97 mmol/L (ref 97–106)
CO2: 28 mmol/L (ref 18–29)
Creatinine, Ser: 1.31 mg/dL — ABNORMAL HIGH (ref 0.76–1.27)
GFR calc non Af Amer: 50 mL/min/{1.73_m2} — ABNORMAL LOW (ref >59)
GFR, EST AFRICAN AMERICAN: 57 mL/min/{1.73_m2} — AB (ref >59)
Glucose: 111 mg/dL — ABNORMAL HIGH (ref 65–99)
Potassium: 4 mmol/L (ref 3.5–5.2)
Sodium: 143 mmol/L (ref 136–144)

## 2015-03-01 LAB — LIPID PANEL
CHOLESTEROL TOTAL: 110 mg/dL (ref 100–199)
Chol/HDL Ratio: 2.9 ratio units (ref 0.0–5.0)
HDL: 38 mg/dL — AB (ref >39)
LDL Calculated: 55 mg/dL (ref 0–99)
TRIGLYCERIDES: 83 mg/dL (ref 0–149)
VLDL CHOLESTEROL CAL: 17 mg/dL (ref 5–40)

## 2015-03-01 LAB — HEMOGLOBIN A1C
ESTIMATED AVERAGE GLUCOSE: 143 mg/dL
Hgb A1c MFr Bld: 6.6 % — ABNORMAL HIGH (ref 4.8–5.6)

## 2015-03-02 ENCOUNTER — Encounter: Payer: Self-pay | Admitting: Internal Medicine

## 2015-03-02 ENCOUNTER — Ambulatory Visit (INDEPENDENT_AMBULATORY_CARE_PROVIDER_SITE_OTHER): Payer: Federal, State, Local not specified - PPO | Admitting: Internal Medicine

## 2015-03-02 VITALS — BP 110/70 | HR 108 | Temp 97.7°F | Resp 20 | Ht 66.0 in | Wt 208.6 lb

## 2015-03-02 DIAGNOSIS — S81809A Unspecified open wound, unspecified lower leg, initial encounter: Secondary | ICD-10-CM

## 2015-03-02 DIAGNOSIS — N182 Chronic kidney disease, stage 2 (mild): Secondary | ICD-10-CM | POA: Diagnosis not present

## 2015-03-02 DIAGNOSIS — N183 Chronic kidney disease, stage 3 unspecified: Secondary | ICD-10-CM

## 2015-03-02 DIAGNOSIS — R0602 Shortness of breath: Secondary | ICD-10-CM | POA: Diagnosis not present

## 2015-03-02 DIAGNOSIS — S81801A Unspecified open wound, right lower leg, initial encounter: Secondary | ICD-10-CM

## 2015-03-02 DIAGNOSIS — Z23 Encounter for immunization: Secondary | ICD-10-CM | POA: Diagnosis not present

## 2015-03-02 DIAGNOSIS — S81001A Unspecified open wound, right knee, initial encounter: Secondary | ICD-10-CM

## 2015-03-02 DIAGNOSIS — E1122 Type 2 diabetes mellitus with diabetic chronic kidney disease: Secondary | ICD-10-CM

## 2015-03-02 DIAGNOSIS — S91001A Unspecified open wound, right ankle, initial encounter: Secondary | ICD-10-CM | POA: Diagnosis not present

## 2015-03-02 DIAGNOSIS — S81009A Unspecified open wound, unspecified knee, initial encounter: Secondary | ICD-10-CM | POA: Insufficient documentation

## 2015-03-02 DIAGNOSIS — E1165 Type 2 diabetes mellitus with hyperglycemia: Secondary | ICD-10-CM

## 2015-03-02 DIAGNOSIS — R609 Edema, unspecified: Secondary | ICD-10-CM | POA: Insufficient documentation

## 2015-03-02 DIAGNOSIS — E785 Hyperlipidemia, unspecified: Secondary | ICD-10-CM

## 2015-03-02 DIAGNOSIS — I5022 Chronic systolic (congestive) heart failure: Secondary | ICD-10-CM

## 2015-03-02 DIAGNOSIS — IMO0002 Reserved for concepts with insufficient information to code with codable children: Secondary | ICD-10-CM

## 2015-03-02 DIAGNOSIS — S91009A Unspecified open wound, unspecified ankle, initial encounter: Secondary | ICD-10-CM

## 2015-03-02 MED ORDER — SILVER SULFADIAZINE 1 % EX CREA: TOPICAL_CREAM | CUTANEOUS | Status: DC

## 2015-03-02 MED ORDER — FUROSEMIDE 40 MG PO TABS: ORAL_TABLET | ORAL | Status: DC

## 2015-03-02 MED ORDER — SPIRONOLACTONE 25 MG PO TABS: ORAL_TABLET | ORAL | Status: DC

## 2015-03-02 NOTE — Patient Instructions (Addendum)
Increase furosemide to 80 mg daily. Start spirolactone 25 mg daily to help swelling. Use the silver sulfadiazine cream daily to the wound n your right leg.  Prescriptions for spironolactone and SSD cream were sent to your pharmacy.

## 2015-03-02 NOTE — Progress Notes (Signed)
Patient ID: Jimmy Dawson, male   DOB: January 21, 1929, 81 y.o.   MRN: [id number]    Facility  [facility]    Place of Service:   OFFICE    No Known Allergies  Chief Complaint  Patient presents with  . Medical Management of Chronic Issues    4 month follow-up for CHF, CKD ,DM right leg. has a sore that will not heal. x63mo  . Abdominal Pain    weak in the abdominal area, incontinent, swelling on his penis    HPI:  Sore on the right lower leg after wearing rubber boots. About 4 x 1.5 inches. Went to the hospital 01/05/15 and was told he had an infected blister. Remains heavily scabbed, but seems to be healing. Applying antiibiotic cream. Took cephalexin and then Septra.  Was started on Entresto by Dr. TWynonia Lawman but he stopped it after his penis started to swell.   History of wife suggested patient has had increased chest congestion, weakness in the abdominal area, and increased confusion.  Uncontrolled type 2 diabetes mellitus with stage 2 chronic kidney disease, without long-term current use of insulin (HCC) - last A1c indicates adequate control with it being less than 7%.  Hyperlipidemia - adequately controlled  Stage III chronic kidney disease - stable to improved  Chronic systolic heart failure (HCC) - worsening. Complains of increasing shortness of breath. Is increased edema. The edema includes not only both legs but the penis.  Edema, unspecified type - increase in size loss of the patient with increased leg edema 2-3+ bilaterally as well as penile edema. He also seems to have rales in both lungs in a short of breath with exertion.  Shortness of breath - increased  Medications: Patient's Medications  New Prescriptions   No medications on file  Previous Medications   ASPIRIN 81 MG TABLET    Take 81 mg by mouth at bedtime.    CARVEDILOL (COREG) 3.125 MG TABLET    TAKE 1 TABLET (3.125 MG TOTAL) BY MOUTH 2 (TWO) TIMES DAILY WITH A MEAL.   FUROSEMIDE (LASIX) 40 MG TABLET    Take one  tablet by mouth once daily for swelling   GLIMEPIRIDE (AMARYL) 2 MG TABLET    TAKE 1 TABLET BY MOUTH EVERY DAY WITH BREAKFAST TO CONTROL DIABETES   IBUPROFEN (ADVIL,MOTRIN) 200 MG TABLET    Take 200 mg by mouth 2 (two) times daily.   LOSARTAN-HYDROCHLOROTHIAZIDE (HYZAAR) 100-25 MG PER TABLET    TAKE ONE TABLET BY MOUTH ONCE DAILY TO CONTROL BLOOD PRESSURE   METFORMIN (GLUCOPHAGE) 500 MG TABLET    TAKE 2 TABLETS (1000MG) BY MOUTH TWICE A DAY WITH MEALS   MULTIPLE VITAMIN (MULTIVITAMIN) TABLET    Take 1 tablet by mouth daily.    OMEPRAZOLE (PRILOSEC) 40 MG CAPSULE    TAKE ONE CAPSULE BY MOUTH TWICE A DAY FOR ACID REDUCTION   SIMVASTATIN (ZOCOR) 40 MG TABLET    TAKE ONE TABLET BY MOUTH ONCE DAILY FOR CHOLESTEROL  Modified Medications   No medications on file  Discontinued Medications   CEPHALEXIN (KEFLEX) 500 MG CAPSULE    Take 2 capsules (1,000 mg total) by mouth 2 (two) times daily.   SULFAMETHOXAZOLE-TRIMETHOPRIM (BACTRIM DS,SEPTRA DS) 800-160 MG TABLET    Take 2 tablets by mouth 2 (two) times daily.    Review of Systems  Constitutional: Negative.  Negative for fever, chills, diaphoresis, activity change, appetite change and fatigue.  HENT: Positive for hearing loss. Negative for congestion, ear pain, nosebleeds, rhinorrhea,  sinus pressure and tinnitus.        Wears prescription lenses. Otherwise negative in regards to symptoms. Wax in both EAC and impacted in right ear.  Eyes:       Wears prescription lenses. Negative for all other symptoms.  Respiratory: Positive for chest tightness and shortness of breath. Negative for cough, choking and wheezing.   Cardiovascular: Positive for leg swelling. Negative for chest pain and palpitations.  Gastrointestinal: Negative for abdominal pain, diarrhea, constipation and blood in stool.  Endocrine: Negative for cold intolerance, heat intolerance, polydipsia, polyphagia and polyuria.  Genitourinary: Negative for dysuria, urgency, frequency, flank pain,  decreased urine volume, enuresis and difficulty urinating.       Penile edema  Musculoskeletal: Negative for myalgias, back pain, arthralgias and gait problem.  Skin:       1.5 x 4" wound of the right leg with heavy scabbing. Although there is mild surrounding erythema, there is no significant purulence or signs of infection. Leg is not warm to touch.  Allergic/Immunologic: Negative.   Neurological: Negative for dizziness, tremors, speech difficulty, weakness, light-headedness, numbness and headaches.       Patient says he is aware that his memory is slipping. 06/30/2014 MMSE 30/30. Passed clock drawing.  Hematological: Negative.   Psychiatric/Behavioral: Positive for confusion.    Filed Vitals:   03/02/15 1244  BP: 110/70  Pulse: 108  Temp: 97.7 F (36.5 C)  TempSrc: Oral  Resp: 20  Height: _0  (1.676 m)  Weight: 208 lb 9.6 oz (94.62 kg)  SpO2: 97%   Body mass index is 33.68 kg/(m^2).  Physical Exam  Constitutional: He is oriented to person, place, and time. He appears well-developed and well-nourished.  HENT:  Significant hearing loss bilaterally. Interferes with conversation.  Eyes:  Wears prescription lenses, otherwise normal.  Neck: Neck supple. No JVD present. No tracheal deviation present. No thyromegaly present.  Cardiovascular: Normal rate, regular rhythm, normal heart sounds and intact distal pulses.  Exam reveals no gallop.   No murmur heard. Pulmonary/Chest: Breath sounds normal. No respiratory distress. He has no wheezes. He has no rales. He exhibits no tenderness.  Abdominal: Soft. Bowel sounds are normal. He exhibits no mass. There is no tenderness.  Musculoskeletal: Normal range of motion.  Chronic back discomfort with movement.  Lymphadenopathy:    He has no cervical adenopathy.  Neurological: He is alert and oriented to person, place, and time. No cranial nerve deficit. Coordination normal.  Wife has reported issues with his memory in the past. He now  says that he is aware of some memory slippage. He does not feel that it incapacitates him in any way.  Skin: Skin is warm and dry. No rash noted. No erythema.  Lesion on the right shoulder that may be a ringworm. 1.5 x 4" lesion of the right anterior foreleg which does not appear infected although there is a mild surrounding erythema.  Psychiatric: He has a normal mood and affect. His behavior is normal. Judgment and thought content normal.    Labs reviewed: Lab Summary Latest Ref Rng 02/28/2015 01/05/2015 01/05/2015 09/30/2014 06/29/2014  Hemoglobin 13.0 - 17.0 g/dL (None) 12.6(L) 10.6(L) (None) (None)  Hematocrit 39.0 - 52.0 % (None) 37.0(L) 35.1(L) (None) (None)  White count 4.0 - 10.5 K/uL (None) (None) 7.0 (None) (None)  Platelet count 150 - 400 K/uL (None) (None) 226 (None) (None)  Sodium 136 - 144 mmol/L 143 142 (None) 141 138  Potassium 3.5 - 5.2 mmol/L 4.0 3.5 (None) 3.5 3.9  Calcium 8.6 - 10.2 mg/dL 9.2 (None) (None) 9.4 9.8  Phosphorus - (None) (None) (None) (None) (None)  Creatinine 0.76 - 1.27 mg/dL 1.31(H) 1.50(H) (None) 1.35(H) 1.44(H)  AST 0 - 40 IU/L (None) (None) (None) 24 (None)  Alk Phos 39 - 117 IU/L (None) (None) (None) 57 (None)  Bilirubin 0.0 - 1.2 mg/dL (None) (None) (None) 0.7 (None)  Glucose 65 - 99 mg/dL 111(H) 202(H) (None) 125(H) 184(H)  Cholesterol - (None) (None) (None) (None) (None)  HDL cholesterol >39 mg/dL 38(L) (None) (None) (None) (None)  Triglycerides 0 - 149 mg/dL 83 (None) (None) (None) (None)  LDL Direct - (None) (None) (None) (None) (None)  LDL Calc 0 - 99 mg/dL 55 (None) (None) (None) (None)  Total protein - (None) (None) (None) (None) (None)  Albumin 3.5 - 4.7 g/dL (None) (None) (None) 4.4 (None)   Lab Results  Component Value Date   TSH 1.454 *Test methodology is 3rd generation TSH 06/27/2007   Lab Results  Component Value Date   BUN 28* 02/28/2015   Lab Results  Component Value Date   HGBA1C 6.6* 02/28/2015    Assessment/Plan  1.  Uncontrolled type 2 diabetes mellitus with stage 2 chronic kidney disease, without long-term current use of insulin (HCC) Continue current medication  2. Hyperlipidemia Continue current dose simvastatin  3. Stage III chronic kidney disease Unchanged to slightly improved.  4. Chronic systolic heart failure (HCC) Worsening. Increased edema, rales in the chest, dyspnea at rest and on exertion.  5. Edema, unspecified type Worsening congestive heart failure  6. Open wound of knee, leg (except thigh), and ankle, complicated, right, initial encounter Slowly healing wound of the right anterior shin -Silver sulfadiazine daily after cleansing of the wound with soap and water  7. Shortness of breath -Increase furosemide 80 mg daily. Add spironolactone 25 mg daily.

## 2015-03-07 ENCOUNTER — Encounter (HOSPITAL_COMMUNITY): Payer: Self-pay | Admitting: General Practice

## 2015-03-07 ENCOUNTER — Other Ambulatory Visit: Payer: Self-pay | Admitting: Cardiology

## 2015-03-07 ENCOUNTER — Inpatient Hospital Stay (HOSPITAL_COMMUNITY)
Admission: AD | Admit: 2015-03-07 | Discharge: 2015-03-12 | DRG: 291 | Disposition: A | Discharge status: Home Health | Payer: Medicare Other | Source: Ambulatory Visit | Attending: Cardiology | Admitting: Cardiology

## 2015-03-07 DIAGNOSIS — I447 Left bundle-branch block, unspecified: Secondary | ICD-10-CM | POA: Diagnosis present

## 2015-03-07 DIAGNOSIS — Z7982 Long term (current) use of aspirin: Secondary | ICD-10-CM | POA: Diagnosis not present

## 2015-03-07 DIAGNOSIS — D509 Iron deficiency anemia, unspecified: Secondary | ICD-10-CM | POA: Diagnosis present

## 2015-03-07 DIAGNOSIS — I5043 Acute on chronic combined systolic (congestive) and diastolic (congestive) heart failure: Secondary | ICD-10-CM | POA: Diagnosis present

## 2015-03-07 DIAGNOSIS — I5023 Acute on chronic systolic (congestive) heart failure: Secondary | ICD-10-CM | POA: Diagnosis present

## 2015-03-07 DIAGNOSIS — K219 Gastro-esophageal reflux disease without esophagitis: Secondary | ICD-10-CM | POA: Diagnosis present

## 2015-03-07 DIAGNOSIS — N5089 Other specified disorders of the male genital organs: Secondary | ICD-10-CM | POA: Diagnosis present

## 2015-03-07 DIAGNOSIS — E785 Hyperlipidemia, unspecified: Secondary | ICD-10-CM | POA: Diagnosis present

## 2015-03-07 DIAGNOSIS — N189 Chronic kidney disease, unspecified: Secondary | ICD-10-CM | POA: Insufficient documentation

## 2015-03-07 DIAGNOSIS — Z833 Family history of diabetes mellitus: Secondary | ICD-10-CM | POA: Diagnosis not present

## 2015-03-07 DIAGNOSIS — I2510 Atherosclerotic heart disease of native coronary artery without angina pectoris: Secondary | ICD-10-CM | POA: Diagnosis present

## 2015-03-07 DIAGNOSIS — L8990 Pressure ulcer of unspecified site, unspecified stage: Secondary | ICD-10-CM | POA: Insufficient documentation

## 2015-03-07 DIAGNOSIS — Z79899 Other long term (current) drug therapy: Secondary | ICD-10-CM | POA: Diagnosis not present

## 2015-03-07 DIAGNOSIS — I272 Other secondary pulmonary hypertension: Secondary | ICD-10-CM | POA: Diagnosis present

## 2015-03-07 DIAGNOSIS — R0600 Dyspnea, unspecified: Secondary | ICD-10-CM | POA: Diagnosis present

## 2015-03-07 DIAGNOSIS — L8991 Pressure ulcer of unspecified site, stage 1: Secondary | ICD-10-CM | POA: Diagnosis present

## 2015-03-07 DIAGNOSIS — E1122 Type 2 diabetes mellitus with diabetic chronic kidney disease: Secondary | ICD-10-CM | POA: Diagnosis present

## 2015-03-07 DIAGNOSIS — Z955 Presence of coronary angioplasty implant and graft: Secondary | ICD-10-CM | POA: Diagnosis not present

## 2015-03-07 DIAGNOSIS — Z7984 Long term (current) use of oral hypoglycemic drugs: Secondary | ICD-10-CM

## 2015-03-07 DIAGNOSIS — Z87891 Personal history of nicotine dependence: Secondary | ICD-10-CM | POA: Diagnosis not present

## 2015-03-07 DIAGNOSIS — I10 Essential (primary) hypertension: Secondary | ICD-10-CM | POA: Insufficient documentation

## 2015-03-07 DIAGNOSIS — N179 Acute kidney failure, unspecified: Secondary | ICD-10-CM | POA: Insufficient documentation

## 2015-03-07 DIAGNOSIS — Z791 Long term (current) use of non-steroidal anti-inflammatories (NSAID): Secondary | ICD-10-CM | POA: Diagnosis not present

## 2015-03-07 DIAGNOSIS — N184 Chronic kidney disease, stage 4 (severe): Secondary | ICD-10-CM | POA: Diagnosis present

## 2015-03-07 DIAGNOSIS — I350 Nonrheumatic aortic (valve) stenosis: Secondary | ICD-10-CM | POA: Diagnosis present

## 2015-03-07 DIAGNOSIS — H9190 Unspecified hearing loss, unspecified ear: Secondary | ICD-10-CM | POA: Diagnosis present

## 2015-03-07 DIAGNOSIS — Z8249 Family history of ischemic heart disease and other diseases of the circulatory system: Secondary | ICD-10-CM | POA: Diagnosis not present

## 2015-03-07 DIAGNOSIS — I130 Hypertensive heart and chronic kidney disease with heart failure and stage 1 through stage 4 chronic kidney disease, or unspecified chronic kidney disease: Principal | ICD-10-CM | POA: Diagnosis present

## 2015-03-07 DIAGNOSIS — I429 Cardiomyopathy, unspecified: Secondary | ICD-10-CM | POA: Diagnosis present

## 2015-03-07 HISTORY — DX: Chronic kidney disease, stage 3 (moderate): N18.3

## 2015-03-07 HISTORY — DX: Chronic kidney disease, stage 3 unspecified: N18.30

## 2015-03-07 HISTORY — DX: Type 2 diabetes mellitus without complications: E11.9

## 2015-03-07 HISTORY — DX: Pneumonia, unspecified organism: J18.9

## 2015-03-07 HISTORY — DX: Heart failure, unspecified: I50.9

## 2015-03-07 HISTORY — DX: Heatstroke and sunstroke, initial encounter: T67.01XA

## 2015-03-07 HISTORY — DX: Respiratory tuberculosis unspecified: A15.9

## 2015-03-07 LAB — COMPREHENSIVE METABOLIC PANEL
ALK PHOS: 92 U/L (ref 38–126)
ALT: 39 U/L (ref 17–63)
ANION GAP: 14 (ref 5–15)
AST: 42 U/L — ABNORMAL HIGH (ref 15–41)
Albumin: 3.3 g/dL — ABNORMAL LOW (ref 3.5–5.0)
BILIRUBIN TOTAL: 0.6 mg/dL (ref 0.3–1.2)
BUN: 41 mg/dL — ABNORMAL HIGH (ref 6–20)
CALCIUM: 9 mg/dL (ref 8.9–10.3)
CO2: 29 mmol/L (ref 22–32)
Chloride: 101 mmol/L (ref 101–111)
Creatinine, Ser: 1.93 mg/dL — ABNORMAL HIGH (ref 0.61–1.24)
GFR calc non Af Amer: 30 mL/min — ABNORMAL LOW (ref >60)
GFR, EST AFRICAN AMERICAN: 35 mL/min — AB (ref >60)
GLUCOSE: 97 mg/dL (ref 65–99)
Potassium: 3.5 mmol/L (ref 3.5–5.1)
Sodium: 144 mmol/L (ref 135–145)
TOTAL PROTEIN: 6.3 g/dL — AB (ref 6.5–8.1)

## 2015-03-07 LAB — CBC WITH DIFFERENTIAL/PLATELET
Basophils Absolute: 0 10*3/uL (ref 0.0–0.1)
Basophils Relative: 0 %
Eosinophils Absolute: 0.1 10*3/uL (ref 0.0–0.7)
Eosinophils Relative: 2 %
HEMATOCRIT: 31.4 % — AB (ref 39.0–52.0)
HEMOGLOBIN: 9.2 g/dL — AB (ref 13.0–17.0)
LYMPHS ABS: 1.3 10*3/uL (ref 0.7–4.0)
Lymphocytes Relative: 16 %
MCH: 22.7 pg — AB (ref 26.0–34.0)
MCHC: 29.3 g/dL — AB (ref 30.0–36.0)
MCV: 77.3 fL — AB (ref 78.0–100.0)
MONOS PCT: 5 %
Monocytes Absolute: 0.4 10*3/uL (ref 0.1–1.0)
NEUTROS ABS: 6.4 10*3/uL (ref 1.7–7.7)
NEUTROS PCT: 77 %
Platelets: 215 10*3/uL (ref 150–400)
RBC: 4.06 MIL/uL — ABNORMAL LOW (ref 4.22–5.81)
RDW: 17.3 % — ABNORMAL HIGH (ref 11.5–15.5)
WBC: 8.3 10*3/uL (ref 4.0–10.5)

## 2015-03-07 LAB — GLUCOSE, CAPILLARY: Glucose-Capillary: 93 mg/dL (ref 65–99)

## 2015-03-07 LAB — APTT: aPTT: 32 seconds (ref 24–37)

## 2015-03-07 LAB — BRAIN NATRIURETIC PEPTIDE: B Natriuretic Peptide: 3397.5 pg/mL — ABNORMAL HIGH (ref 0.0–100.0)

## 2015-03-07 LAB — TSH: TSH: 2.092 u[IU]/mL (ref 0.350–4.500)

## 2015-03-07 LAB — PROTIME-INR
INR: 1.28 (ref 0.00–1.49)
Prothrombin Time: 16.1 seconds — ABNORMAL HIGH (ref 11.6–15.2)

## 2015-03-07 MED ORDER — SODIUM CHLORIDE 0.9 % IJ SOLN
3.0000 mL | Freq: Two times a day (BID) | INTRAMUSCULAR | Status: DC
  Administered 2015-03-07 – 2015-03-11 (×7): 3 mL via INTRAVENOUS

## 2015-03-07 MED ORDER — ASPIRIN EC 81 MG PO TBEC
81.0000 mg | DELAYED_RELEASE_TABLET | Freq: Every day | ORAL | Status: DC
  Administered 2015-03-07 – 2015-03-12 (×6): 81 mg via ORAL
  Filled 2015-03-07 (×6): qty 1

## 2015-03-07 MED ORDER — SODIUM CHLORIDE 0.9 % IJ SOLN
3.0000 mL | INTRAMUSCULAR | Status: DC | PRN
Start: 2015-03-07 — End: 2015-03-12

## 2015-03-07 MED ORDER — ONDANSETRON HCL 4 MG/2ML IJ SOLN: 4.0000 mg | Freq: Four times a day (QID) | INTRAMUSCULAR | Status: DC | PRN

## 2015-03-07 MED ORDER — ACETAMINOPHEN 325 MG PO TABS: 650.0000 mg | ORAL_TABLET | ORAL | Status: DC | PRN

## 2015-03-07 MED ORDER — CARVEDILOL 3.125 MG PO TABS
3.1250 mg | ORAL_TABLET | Freq: Two times a day (BID) | ORAL | Status: DC
  Administered 2015-03-08 (×2): 3.125 mg via ORAL
  Filled 2015-03-07 (×2): qty 1

## 2015-03-07 MED ORDER — ENOXAPARIN SODIUM 40 MG/0.4ML ~~LOC~~ SOLN
40.0000 mg | SUBCUTANEOUS | Status: DC
  Administered 2015-03-07 – 2015-03-11 (×5): 40 mg via SUBCUTANEOUS
  Filled 2015-03-07 (×5): qty 0.4

## 2015-03-07 MED ORDER — SODIUM CHLORIDE 0.9 % IV SOLN: 250.0000 mL | INTRAVENOUS | Status: DC | PRN

## 2015-03-07 MED ORDER — INSULIN ASPART 100 UNIT/ML ~~LOC~~ SOLN
0.0000 [IU] | Freq: Three times a day (TID) | SUBCUTANEOUS | Status: DC
  Administered 2015-03-08: 3 [IU] via SUBCUTANEOUS
  Administered 2015-03-08: 2 [IU] via SUBCUTANEOUS
  Administered 2015-03-09 (×2): 3 [IU] via SUBCUTANEOUS
  Administered 2015-03-10: 2 [IU] via SUBCUTANEOUS
  Administered 2015-03-10 (×2): 3 [IU] via SUBCUTANEOUS
  Administered 2015-03-11: 2 [IU] via SUBCUTANEOUS
  Administered 2015-03-11: 3 [IU] via SUBCUTANEOUS
  Administered 2015-03-11: 2 [IU] via SUBCUTANEOUS
  Administered 2015-03-12: 5 [IU] via SUBCUTANEOUS

## 2015-03-07 MED ORDER — SACUBITRIL-VALSARTAN 49-51 MG PO TABS
1.0000 | ORAL_TABLET | Freq: Two times a day (BID) | ORAL | Status: DC
  Administered 2015-03-07 – 2015-03-08 (×3): 1 via ORAL
  Filled 2015-03-07 (×6): qty 1

## 2015-03-07 MED ORDER — FUROSEMIDE 10 MG/ML IJ SOLN
80.0000 mg | Freq: Two times a day (BID) | INTRAMUSCULAR | Status: DC
  Administered 2015-03-07 – 2015-03-10 (×7): 80 mg via INTRAVENOUS
  Filled 2015-03-07 (×7): qty 8

## 2015-03-07 MED ORDER — IVABRADINE HCL 5 MG PO TABS
5.0000 mg | ORAL_TABLET | Freq: Two times a day (BID) | ORAL | Status: DC
  Administered 2015-03-08 (×2): 5 mg via ORAL
  Filled 2015-03-07 (×3): qty 1

## 2015-03-07 NOTE — H&P (Signed)
Jimmy Dawson    Date of visit:  03/07/2015 DOB:  [birth year]    Age:  81 yrs. Medical record number:  [id number]     Account number:  [id number] Primary Care Provider: GREEN III, ARTHUR G ____________________________ CURRENT DIAGNOSES  1. Dyspnea and edema  2. Chronic systolic heart failure  3. Hypertensive heart disease without heart failure  4. CAD Native without angina  5. Aortic valve disorder, unspecified  6. Left bundle-branch block  7. Hyperlipidemia  8. Chronic kidney disease, stage 3 (moderate)  9. Type 2 diabetes mellitus without complications  10. Presence of coronary angioplasty implant and graft  11. Gastro-esophageal reflux disease without esophagitis ____________________________ ALLERGIES  No Known Allergies ____________________________ MEDICATIONS  1. omeprazole 40 mg Capsule, Delayed Release(E.C.), 1 p.o. daily  2. multivitamin Tablet, 1 p.o. daily  3. simvastatin 40 mg tablet, 1 p.o. daily  4. Metamucil 3.4 gram/12 gram Powder, qd  5. aspirin 81 mg chewable tablet, 1 p.o. daily  6. metformin 500 mg tablet, 2 p.o. b.i.d.  7. glimepiride 2 mg tablet, 1 p.o. daily  8. carvedilol 3.125 mg tablet, BID  9. Colace 100 mg capsule, PRN  10. ibuprofen 200 mg tablet, PRN  11. spironolactone 25 mg tablet, 1 p.o. daily  12. furosemide 40 mg tablet, 2 p.o. daily ____________________________ CHIEF COMPLAINTS  Edema  Followup of Chronic systolic heart failure ____________________________ HISTORY OF PRESENT ILLNESS This 81 year old male is seen and admitted to the hospital for worsening edema in the setting of increased diuretics and heart failure. The patient has a history of coronary artery disease with a stent in the LAD in 2002. He is also diabetic and hypertensive and hyperlipidemia. He developed congestive heart failure earlier this year and had a drop in his ejection fraction to 25% as well as a left bundle. At catheterization he was found to have a pain LAD stent,  minimal disease in the RCA and the circumflex and a moderate ostial left main stenosis. He did not have any clinical angina. There was minimal gradient across the aortic valve. He was treated intensively for heart failure and medically. He had persistent tachycardia and Corlanor was added to his regimen and later Delene Loll was added to his regimen and titrated up. When last seen he was doing quite well without much in the way of edema. For unexplained reasons he stopped taking both Corlanor and Entresto about 3 weeks ago. He has subsequently had a 20 pound weight gain and despite increasing his diuretics on an outpatient basis to 80 mg twice a day he has had worsening edema, PND and has now developed  penile and scrotal edema. He is hard of hearing and also has somewhat poor comprehension of his overall medical issues. I felt it best to admit him to the hospital for intensive diuresis in light of his anasarca and previous poor medical compliance. ____________________________ PAST HISTORY  Past Medical Illnesses:  hypertension, DM-non-insulin dependent, hyperlipidemia, obesity, GERD, BPH, TIA;  Cardiovascular Illnesses:  CAD, aortic sclerosis;  Surgical Procedures:  TURP;  NYHA Classification:  II;  Canadian Angina Classification:  Class 0: Asymptomatic;  Cardiology Procedures-Invasive:  Penta stent February 2002 Dr. Glade Lloyd, cardiac cath (left) February 2016;  Cardiology Procedures-Noninvasive:  no previous non-invasive cardiovascular testing;  Cardiac Cath Results:  40% ostiall Left main, 40% proximal LAD, patent stent mid LAD, 95% stenosis proximal Diag 1, scattered irregularities CFX, scattered irregularities RCA;  LVEF of 20% documented via echocardiogram on 03/07/2015,   ____________________________ CARDIO-PULMONARY TEST DATES  EKG Date:  10/26/2014;   Cardiac Cath Date:  05/24/2014;  Stent Placement Date: 05/08/2000;  Nuclear Study Date:  05/06/2014;  Echocardiography Date: 03/07/2015;  Chest Xray Date:  06/27/2007;   ____________________________ FAMILY HISTORY Brother -- Brother dead, Myocardial infarction Brother -- Brother dead, Unknown disease Brother -- Brother dead, Unknown disease Father -- Father dead, Myocardial infarction Mother -- Mother dead, Diabetes mellitus, Coronary Artery Disease ____________________________ SOCIAL HISTORY Alcohol Use:  does not use alcohol;  Smoking:  nonsmoker;  Lifestyle:  widower and remarried 12 years;  Exercise:  no regular exercise;  Occupation:  [employer]-retired;  Residence:  lives with wife;   ____________________________ REVIEW OF SYSTEMS General:  weight gain of approximately 20 lbs  Integumentary:recent skin cancer Eyes: decreased acuity O.D. Ears, Nose, Throat, Mouth:  denies any hearing loss, epistaxis, hoarseness or difficulty speaking. Respiratory: dyspnea with exertion Cardiovascular:  please review HPI Abdominal: denies dyspepsia, GI bleeding, constipation, or diarrhea Genitourinary-Male: frequency, nocturia, penile edema  Musculoskeletal:  chronic low back pain  ____________________________ PHYSICAL EXAMINATION VITAL SIGNS  Blood Pressure:  148/74 Sitting, Right arm, regular cuff  , 154/84 Standing, Right arm and regular cuff   Pulse:  104/min. Weight:  208.00 lbs. Height:  69"BMI: 30  Constitutional:  pleasant white male in no acute distress, mildly obese Skin:  warm and dry to touch, no apparent skin lesions, or masses noted. Head:  normocephalic, normal hair pattern, no masses or tenderness Eyes:  strabismus ENT:  ears, nose and throat reveal no gross abnormalities.  Dentition good. Neck:  no bruits, no masses, non-tender, JVD at 45 degrees 6cm Chest:  normal symmetry, reduced BS bases, mild rales on right Cardiac:  regular rhythm, normal S1 and S2, no murmur, grade 1/6 systolic murmur at aortic area radiating to neck Abdomen:  distended fluid wave Peripheral Pulses:  femoral pulses 2+, posterior tibial pulses 3+ Genitalia Male:   penile and scrotal edema Extremities & Back:  mild bilateral venous insufficiency changes present, 3+ edema present, bandage over right lower leg over wound not examined Neurological:  no gross motor or sensory deficits noted, affect appropriate, oriented x3. ____________________________ MOST RECENT LIPID PANEL 04/26/14  CHOL TOTL 146 mg/dl, LDL 84 NM, HDL 40 mg/dl, TRIGLYCER 108 mg/dl and CHOL/HDL 3.7 (Calc) ____________________________ IMPRESSIONS/PLAN  1. Acute on chronic systolic heart failure 2. Left bundle branch block 3. Moderate left main and LAD coronary artery disease 4. Cardiomyopathy type undetermined 5. Diabetes mellitus with vascular disease 6. Hyperlipidemia 7. Chronic kidney disease 8. Mild to moderate aortic stenosis 9. Open wound on right lower leg  Recommendations:  Compliance is a definite issue. He will be admitted to the hospital for intravenous diuresis. His Corlanor and his Delene Loll will be restarted. He has good blood pressure so hopefully this will take effect. Continue spironolactone as well as carvedilol. May have electrophysiology consult to determine if biventricular pacemaking would help his heart failure which is now class 3-4.  Echocardiogram was done today in the office. He has marked left atrial enlargement, severe LV dysfunction with mild dyssynchrony of the septum and an EF of around 20%, moderate aortic stenosis and moderate. Hypertension estimated at around 55-60 mm.   ____________________________ Cardiology Physician:  Kerry Hough MD [facility]

## 2015-03-08 ENCOUNTER — Inpatient Hospital Stay (HOSPITAL_COMMUNITY): Payer: Medicare Other

## 2015-03-08 DIAGNOSIS — N179 Acute kidney failure, unspecified: Secondary | ICD-10-CM | POA: Insufficient documentation

## 2015-03-08 DIAGNOSIS — I10 Essential (primary) hypertension: Secondary | ICD-10-CM

## 2015-03-08 DIAGNOSIS — I5023 Acute on chronic systolic (congestive) heart failure: Secondary | ICD-10-CM

## 2015-03-08 DIAGNOSIS — N189 Chronic kidney disease, unspecified: Secondary | ICD-10-CM

## 2015-03-08 LAB — RETICULOCYTES
RBC.: 3.89 MIL/uL — AB (ref 4.22–5.81)
RETIC CT PCT: 1.5 % (ref 0.4–3.1)
Retic Count, Absolute: 58.4 10*3/uL (ref 19.0–186.0)

## 2015-03-08 LAB — GLUCOSE, CAPILLARY
GLUCOSE-CAPILLARY: 85 mg/dL (ref 65–99)
GLUCOSE-CAPILLARY: 144 mg/dL — AB (ref 65–99)
GLUCOSE-CAPILLARY: 176 mg/dL — AB (ref 65–99)
GLUCOSE-CAPILLARY: 173 mg/dL — AB (ref 65–99)
Glucose-Capillary: 62 mg/dL — ABNORMAL LOW (ref 65–99)

## 2015-03-08 LAB — BASIC METABOLIC PANEL
Anion gap: 11 (ref 5–15)
BUN: 38 mg/dL — AB (ref 6–20)
CALCIUM: 8.9 mg/dL (ref 8.9–10.3)
CO2: 33 mmol/L — ABNORMAL HIGH (ref 22–32)
Chloride: 100 mmol/L — ABNORMAL LOW (ref 101–111)
Creatinine, Ser: 1.57 mg/dL — ABNORMAL HIGH (ref 0.61–1.24)
GFR calc Af Amer: 45 mL/min — ABNORMAL LOW (ref >60)
GFR, EST NON AFRICAN AMERICAN: 39 mL/min — AB (ref >60)
GLUCOSE: 62 mg/dL — AB (ref 65–99)
POTASSIUM: 3.1 mmol/L — AB (ref 3.5–5.1)
Sodium: 144 mmol/L (ref 135–145)

## 2015-03-08 LAB — IRON AND TIBC
Iron: 15 ug/dL — ABNORMAL LOW (ref 45–182)
SATURATION RATIOS: 4 % — AB (ref 17.9–39.5)
TIBC: 370 ug/dL (ref 250–450)
UIBC: 355 ug/dL

## 2015-03-08 LAB — FOLATE: FOLATE: 23.4 ng/mL (ref >5.9)

## 2015-03-08 LAB — FERRITIN: Ferritin: 26 ng/mL (ref 24–336)

## 2015-03-08 LAB — VITAMIN B12: VITAMIN B 12: 493 pg/mL (ref 180–914)

## 2015-03-08 MED ORDER — IVABRADINE HCL 5 MG PO TABS
5.0000 mg | ORAL_TABLET | Freq: Two times a day (BID) | ORAL | Status: DC
  Administered 2015-03-09 – 2015-03-12 (×7): 5 mg via ORAL
  Filled 2015-03-08 (×9): qty 1

## 2015-03-08 MED ORDER — CARVEDILOL 3.125 MG PO TABS
3.1250 mg | ORAL_TABLET | Freq: Two times a day (BID) | ORAL | Status: DC
  Administered 2015-03-09 – 2015-03-12 (×7): 3.125 mg via ORAL
  Filled 2015-03-08 (×7): qty 1

## 2015-03-08 MED ORDER — POTASSIUM CHLORIDE 20 MEQ/15ML (10%) PO SOLN
40.0000 meq | Freq: Two times a day (BID) | ORAL | Status: AC
  Administered 2015-03-08 (×2): 40 meq via ORAL
  Filled 2015-03-08 (×2): qty 30

## 2015-03-08 NOTE — Progress Notes (Signed)
Utilization review completed. Samantha Olivera, RN, BSN. 

## 2015-03-08 NOTE — Progress Notes (Signed)
Subjective:  Starting to diurese overnight.  Says that his breathing is better.  Still with marked anasarca.  Weight down 3 pounds.  No chest pain.  He had experienced a 20 pound weight gain and had stopped taking his medicines without consulting [place].  Echo in the office yesterday showed an EF of around 20%.  He had moderate aortic stenosis but when had catheterization a year ago was not felt to be severe enough to need intervention.  Objective:  Vital Signs in the last 24 hours: BP 100/52 mmHg  Pulse 86  Temp(Src) 97.8 F (36.6 C) (Oral)  Resp 18  Ht 5\' 6"  (1.676 m)  Wt 90.175 kg (198 lb 12.8 oz)  BMI 32.10 kg/m2  SpO2 91%  Physical Exam: Elderly male somewhat hard of hearing in no acute distress Lungs:  Reduced breath sounds at bases Cardiac:  Regular rhythm, normal S1 and S2, no S3, 2/6 systolic murmur over aortic valve Abdomen:  Soft, nontender, no masses, distended Extremities:  3+ edema with penile edema also noted.  In addition bandage over ulcer on front of the leg.    Intake/Output from previous day: 12/05 0701 - 12/06 0700 In: 480 [P.O.:480] Out: 3380 [Urine:3380] Weight Filed Weights   03/07/15 2023 03/07/15 2024 03/08/15 0352  Weight: 91.445 kg (201 lb 9.6 oz) 91.445 kg (201 lb 9.6 oz) 90.175 kg (198 lb 12.8 oz)    Lab Results: Basic Metabolic Panel:  Recent Labs  03/07/15 2030 03/08/15 0400  NA 144 144  K 3.5 3.1*  CL 101 100*  CO2 29 33*  GLUCOSE 97 62*  BUN 41* 38*  CREATININE 1.93* 1.57*    CBC:  Recent Labs  03/07/15 2030  WBC 8.3  NEUTROABS 6.4  HGB 9.2*  HCT 31.4*  MCV 77.3*  PLT 215    BNP    Component Value Date/Time   BNP 3397.5* 03/07/2015 2030   BNP 1076.5* 09/30/2014 1119    PROTIME: Lab Results  Component Value Date   INR 1.28 03/07/2015    Telemetry: Sinus rhythm with PVCs  Assessment/Plan:  1.  Acute on chronic systolic heart failure 2.  Stage 3-4 chronic kidney disease 3.  Anemia which is new 4.  Left bundle  branch block with mild dyssynchrony 5.  Diabetes mellitus  Recommendations:  20 pound weight gain and still with significant anasarca.  Currently being diuresed and was started back on his previous Entresto as well as Corlanor.  Blood pressure is down from what it was in the office yesterday.  Continue intravenous diuresis at the present time.  Workup anemia.  May ask EP for opinion as to whether he would benefit from biventricular pacing in terms of his left bundle branch block.      Kerry Hough  MD [facility] Cardiology  03/08/2015, 8:36 AM

## 2015-03-08 NOTE — Consult Note (Signed)
ELECTROPHYSIOLOGY CONSULT NOTE    Patient ID: Jimmy Dawson MRN: [id number], DOB/AGE: 1928/12/24 81 y.o.  Admit date: 03/07/2015 Date of Consult: 03/08/2015  Primary Physician: Estill Dooms, MD Primary Cardiologist: Dr. Wynonia Lawman  Reason for Consultation: cardiomyopathy  HPI: Jimmy Dawson is a 81 y.o. male admitted to [facility] on 03/07/15 with CHF, fluid OL.  He has PMHx of CAD and previous stenting to LAD in 2002,more recently noted to have worsening EF and cath Feb 2016 with patent stent and no obstructive disease,he was treated for his CM with Entresto, Corlanor, coreg and doing well until recently. He had self stopped the Entresto and Corlanor 3 weeks ago and since has had progressive weight gain and worsening edema, admitted with anasarca being managed with Dr. Wynonia Lawman.  Currently the patient feels some improvement in his edema, he has no rest SOB, no CP, palpitations.  The pt explains that he stopped the medicine because he noted swelling particularly scrotal and penile swelling that he thought was the medicines, without improvement in the edema, and worsening LE swelling.  His wife is bedside, states that his exertional capacity especially in the last 51months has steadily declined easily winded needing to take frequent rests to catch his breath, though generally slowing over the last year or so.  Past Medical History  Diagnosis Date  . Hyperlipidemia   . Hypertensive heart disease   . Unspecified disorder of kidney and ureter   . Diastasis of muscle   . Elevated prostate specific antigen (PSA)   . GERD (gastroesophageal reflux disease)   . CAD (coronary artery disease)   . Aortic valve disease   . Type II diabetes mellitus ([place])   . CHF (congestive heart failure) ([place])   . Chronic kidney disease (CKD), stage III (moderate)   . Sun stroke ~ 2014    "haven't had any strength since"  . Tuberculosis     "when I was a child they said I'd had a spot on my lungs & that it was TB but it  was ok then"  . Pneumonia     "small case"  . CAP (community acquired pneumonia) 09/2013    Archie Endo 10/03/2013     Surgical History:  Past Surgical History  Procedure Laterality Date  . Finger surgery Right 1955    INDEX AND RIGHT MIDDLE FINGER  . Sp intro uret cath perc  03-1999  . Left and right heart catheterization with coronary angiogram N/A 05/24/2014    Procedure: LEFT AND RIGHT HEART CATHETERIZATION WITH CORONARY ANGIOGRAM;  Surgeon: Jacolyn Reedy, MD;  Location: [facility] CATH LAB;  Service: Cardiovascular;  Laterality: N/A;  . Coronary angioplasty with stent placement  05/2000  . Cardiac catheterization  05/2014  . Transurethral resection of prostate  10/2000    /notes 08/16/2010     Prescriptions prior to admission  Medication Sig Dispense Refill Last Dose  . aspirin 81 MG tablet Take 81 mg by mouth at bedtime.    03/07/2015 at Unknown time  . carvedilol (COREG) 3.125 MG tablet TAKE 1 TABLET (3.125 MG TOTAL) BY MOUTH 2 (TWO) TIMES DAILY WITH A MEAL. 180 tablet 3 03/07/2015 at Unknown time  . furosemide (LASIX) 40 MG tablet Take two tablets by mouth daily for swelling 60 tablet 5 03/07/2015 at Unknown time  . glimepiride (AMARYL) 2 MG tablet TAKE 1 TABLET BY MOUTH EVERY DAY WITH BREAKFAST TO CONTROL DIABETES 90 tablet 1 03/07/2015 at Unknown time  . ibuprofen (ADVIL,MOTRIN) 200 MG  tablet Take 200 mg by mouth 2 (two) times daily.   03/07/2015 at Unknown time  . losartan-hydrochlorothiazide (HYZAAR) 100-25 MG per tablet TAKE ONE TABLET BY MOUTH ONCE DAILY TO CONTROL BLOOD PRESSURE 90 tablet 3 03/07/2015 at Unknown time  . metFORMIN (GLUCOPHAGE) 500 MG tablet TAKE 2 TABLETS (1000MG ) BY MOUTH TWICE A DAY WITH MEALS 360 tablet 1 03/07/2015 at Unknown time  . Multiple Vitamin (MULTIVITAMIN) tablet Take 1 tablet by mouth daily.    03/07/2015 at Unknown time  . omeprazole (PRILOSEC) 40 MG capsule TAKE ONE CAPSULE BY MOUTH TWICE A DAY FOR ACID REDUCTION 180 capsule 2 03/07/2015 at Unknown time  .  simvastatin (ZOCOR) 40 MG tablet TAKE ONE TABLET BY MOUTH ONCE DAILY FOR CHOLESTEROL 90 tablet 2 03/07/2015 at Unknown time  . spironolactone (ALDACTONE) 25 MG tablet One each morning to reduce edema and to strengthen the heart 30 tablet 5 03/07/2015 at Unknown time  . silver sulfADIAZINE (SILVADENE) 1 % cream Cleanse wound daily then apply cream to the wound and cover with gauze 50 g 3 unknown at unknown    Inpatient Medications:  . aspirin EC  81 mg Oral Daily  . carvedilol  3.125 mg Oral BID WC  . enoxaparin (LOVENOX) injection  40 mg Subcutaneous Q24H  . furosemide  80 mg Intravenous Q12H  . insulin aspart  0-15 Units Subcutaneous TID WC  . ivabradine  5 mg Oral BID WC  . potassium chloride  40 mEq Oral BID  . sacubitril-valsartan  1 tablet Oral BID  . sodium chloride  3 mL Intravenous Q12H    Allergies: No Known Allergies  Social History   Social History  . Marital Status: Married    Spouse Name: N/A  . Number of Children: N/A  . Years of Education: N/A   Occupational History  . Not on file.   Social History Main Topics  . Smoking status: Former Smoker -- 0.50 packs/day for 1 years    Types: Cigarettes    Quit date: 04/02/1968  . Smokeless tobacco: Former Systems developer    Types: Chew     Comment: "chewed very little'  . Alcohol Use: 0.0 oz/week    0 Standard drinks or equivalent per week     Comment: 03/07/2015 "maybe once/year"  . Drug Use: No  . Sexual Activity: Not on file   Other Topics Concern  . Not on file   Social History Narrative   Married, live in [place] home, lives w/ spouse, has a living well, retired Surveyor, mining. Does not use cafferine, exerise walk. Has dog.     Family History  Problem Relation Age of Onset  . Heart disease Mother   . Diabetes Mother   . Heart disease Father   . Heart attack Father   . Heart disease Sister   . Diabetes Sister   . Heart disease Brother   . Diabetes Brother   . Diabetes Brother   . Heart disease Brother   .  Diabetes Brother   . Heart disease Brother      Review of Systems: All other systems reviewed and are otherwise negative except as noted above.  Physical Exam: Filed Vitals:   03/07/15 2349 03/08/15 0352 03/08/15 0747 03/08/15 1223  BP: 104/62 99/58 100/52 82/58  Pulse: 94 94 86 77  Temp:  98 F (36.7 C) 97.8 F (36.6 C) 97.4 F (36.3 C)  TempSrc:  Oral Oral Oral  Resp: 20 18 18 18   Height:  Weight:  198 lb 12.8 oz (90.175 kg)    SpO2: 96% 93% 91% 98%    GEN- The patient an elderly WM in NAD, sitting in bedside chair eating lunch, alert and oriented x 3 today.   HEENT: normocephalic, atraumatic; sclera clear, conjunctiva pink; hearing reduced; oropharynx clear; neck supple  Lungs- soft crackles at the bases bilaterally, normal work of breathing.  No wheezes, rales, rhonchi Heart- Regular rate and rhythm, soft SM, rubs or gallops, PMI not laterally displaced GI- soft, non-tender, non-distended Extremities- no clubbing, cyanosis, 2+ edema MS- age appropriate atrophy Skin- warm and dry, no rash or lesion Psych- euthymic mood, full affect Neuro- no gross motor deficits observed, hard of hearing  Labs:   Lab Results  Component Value Date   WBC 8.3 03/07/2015   HGB 9.2* 03/07/2015   HCT 31.4* 03/07/2015   MCV 77.3* 03/07/2015   PLT 215 03/07/2015    Recent Labs Lab 03/07/15 2030 03/08/15 0400  NA 144 144  K 3.5 3.1*  CL 101 100*  CO2 29 33*  BUN 41* 38*  CREATININE 1.93* 1.57*  CALCIUM 9.0 8.9  PROT 6.3*  --   BILITOT 0.6  --   ALKPHOS 92  --   ALT 39  --   AST 42*  --   GLUCOSE 97 [place] cardiology note:  Cardiology Procedures-Invasive: Penta stent February 2002 Dr. Glade Lloyd, cardiac cath (left) February 2016; Cardiology Procedures-Noninvasive: no previous non-invasive cardiovascular testing; Cardiac Cath Results: 40% ostiall Left main, 40% proximal LAD, patent stent mid LAD, 95% stenosis proximal Diag 1, scattered irregularities CFX, scattered  irregularities RCA; LVEF of 20% documented via echocardiogram on 03/07/2015,   05/24/14 L/R heart cath IMPRESSIONS:  1. Moderate left main coronary artery disease with calcified and moderate LAD disease but no severe focal obstructive stenoses noted elsewhere 2. Moderate pulmonary hypertension.Marland Kitchen HEMODYNAMICS:   Right atrium: A = 13 V equals 9, mean equals 9  Right Ventricle: 58/7013 Pulmonary Artery: 58/35 Sat= 52% PCWP: A = 30 V =32 mean equals 30 Aorta 122/76 Sat= 93%  LV 130/25-30.  Aortic valve area 1.94 cm Cardiac output 4.62 L/m There was minimal gradient between the left ventricle and aorta.   10/05/14: Echocardiogram Study Conclusions - Left ventricle: False tendon in the LV apex of no clinical significance. E/e&'>14 suggestive of elevated LV filling pressures. The cavity size was moderately dilated. Systolic function was moderately reduced. The estimated ejection fraction was in the range of 35% to 40%. There is severe hypokinesis of the entireanteroseptal myocardium. There was an increased relative contribution of atrial contraction to ventricular filling. Doppler parameters are consistent with abnormal left ventricular relaxation (grade 1 diastolic dysfunction). - Aortic valve: Moderate thickening and calcification, consistent with sclerosis. There was mild stenosis. Valve area (VTI): 1.65 cm^2. Valve area (Vmax): 1.63 cm^2. - Mitral valve: Mildly to moderately calcified annulus. Mild calcification. There was trivial regurgitation. - Left atrium: The atrium was severely dilated. - Tricuspid valve: There was trivial regurgitation.     Radiology/Studies:  Dg Chest 2 View 03/08/2015  CLINICAL DATA:  F/u chronic systolic congestive heart failure - hx of diabetes, CAD, kidney disease, TB, PNA EXAM: CHEST  2 VIEW  COMPARISON:  04/26/2014 FINDINGS: Heart is mildly enlarged. There is mild pulmonary vascular congestion but no overt edema. There are no focal consolidations or pleural effusions. IMPRESSION: Cardiomegaly.  Vascular congestion. Electronically Signed   By: Nolon Nations M.D.   On: 03/08/2015 10:30    EKG:  SR, LAD, QS V1,QRS 86 msec TELEMETRY: SR   Assessment and Plan:  1. Aute/chronic systolic CHF, ischemic CM     Continue diuresis with cardiology, negative -3426ml  2.ICM    Pt self stopped Entresto and corlanor at home    On BB, entresto, corlanor, lasix here  3. CAD      No CP, stable anatomy per records cath Feb 2016 without intervention   Signed, Tommye Standard, Hershal Coria 03/08/2015 12:43 PM  I have seen, examined the patient, and reviewed the above assessment and plan. On exam, frail and elderly male with volume overload.  Changes to above are made where necessary.  Pt with advanced CHF.  Recently less compliant with medicines.  Now admitted with worsening CHF.  Renal failure is also likely a contributor. Given advanced age, he is not a candidate for ICD. I have reviewed his EKGs in epic and also on telemetry and find QRS <130 msec.  He does not meet criteria for CRT. I would therefore advise medical management for this patient as directed by Dr Wynonia Lawman.  Electrophysiology team to see as needed while here. Please call with questions.   Co Sign: Thompson Grayer, MD 03/08/2015 5:24 PM

## 2015-03-08 NOTE — Progress Notes (Signed)
Utilization review completed. Declyn Offield, RN, BSN. 

## 2015-03-09 LAB — BASIC METABOLIC PANEL
Anion gap: 10 (ref 5–15)
BUN: 31 mg/dL — AB (ref 6–20)
CALCIUM: 8.8 mg/dL — AB (ref 8.9–10.3)
CO2: 33 mmol/L — AB (ref 22–32)
Chloride: 94 mmol/L — ABNORMAL LOW (ref 101–111)
Creatinine, Ser: 1.56 mg/dL — ABNORMAL HIGH (ref 0.61–1.24)
GFR calc Af Amer: 45 mL/min — ABNORMAL LOW (ref >60)
GFR, EST NON AFRICAN AMERICAN: 39 mL/min — AB (ref >60)
GLUCOSE: 134 mg/dL — AB (ref 65–99)
POTASSIUM: 3.9 mmol/L (ref 3.5–5.1)
Sodium: 137 mmol/L (ref 135–145)

## 2015-03-09 LAB — GLUCOSE, CAPILLARY
GLUCOSE-CAPILLARY: 199 mg/dL — AB (ref 65–99)
Glucose-Capillary: 116 mg/dL — ABNORMAL HIGH (ref 65–99)
Glucose-Capillary: 188 mg/dL — ABNORMAL HIGH (ref 65–99)
Glucose-Capillary: 149 mg/dL — ABNORMAL HIGH (ref 65–99)

## 2015-03-09 LAB — HEMOGLOBIN A1C
Hgb A1c MFr Bld: 6.7 % — ABNORMAL HIGH (ref 4.8–5.6)
MEAN PLASMA GLUCOSE: 146 mg/dL

## 2015-03-09 MED ORDER — FERROUS SULFATE 325 (65 FE) MG PO TABS
325.0000 mg | ORAL_TABLET | Freq: Three times a day (TID) | ORAL | Status: DC
  Administered 2015-03-09 (×2): 325 mg via ORAL
  Filled 2015-03-09 (×2): qty 1

## 2015-03-09 MED ORDER — SACUBITRIL-VALSARTAN 24-26 MG PO TABS
1.0000 | ORAL_TABLET | Freq: Two times a day (BID) | ORAL | Status: DC
  Administered 2015-03-09 – 2015-03-12 (×7): 1 via ORAL
  Filled 2015-03-09 (×8): qty 1

## 2015-03-09 MED ORDER — SPIRONOLACTONE 25 MG PO TABS
12.5000 mg | ORAL_TABLET | Freq: Every day | ORAL | Status: DC
  Administered 2015-03-09 – 2015-03-12 (×4): 12.5 mg via ORAL
  Filled 2015-03-09 (×4): qty 1

## 2015-03-09 NOTE — Consult Note (Addendum)
WOC wound consult note Reason for Consult: Consult requested for buttocks and right leg.  Pt states both sites have been present several weeks. Wound type: Buttocks with stage 1; affected area is 4X4 cm, generalized erythremia which does not blanch.  No open wound or drainage.  Pt states he spends a large amt of time in a recliner at home. Pressure Ulcer POA: Yes Right calf with previous full thickness wound.  Pt states it has greatly improved.  Removed loose dry yellow scabbed areas to 3X3cm location, revealing few patchy areas of partial thickness skin loss, pink and dry, no odor or drainage. Dressing procedure/placement/frequency: Barrier cream to protect skin and promote healing to buttocks.  Foam dressing to protect right leg from further injury. Please re-consult if further assistance is needed.  Thank-you,  Julien Girt MSN, [place], [place], [place], [place]

## 2015-03-09 NOTE — Progress Notes (Signed)
Subjective:  Weight down now to 194 pounds.  States that he did not sleep well last night and had difficulty breathing.  Diuresing quite well at the present time.  Tending to run low on some of his pressures and may need to cut Entresto dose back  Objective:  Vital Signs in the last 24 hours: BP 101/64 mmHg  Pulse 83  Temp(Src) 97.6 F (36.4 C) (Oral)  Resp 18  Ht 5\' 6"  (1.676 m)  Wt 88.27 kg (194 lb 9.6 oz)  BMI 31.42 kg/m2  SpO2 96%  Physical Exam: Elderly male somewhat hard of hearing in no acute distress Lungs:  Reduced breath sounds at bases Cardiac:  Regular rhythm, normal S1 and S2, no S3, 2/6 systolic murmur over aortic valve Abdomen:  Soft, nontender, no masses, distended Extremities:  3+ edema with penile edema also noted.  In addition bandage over ulcer on front of the leg.    Intake/Output from previous day: 12/06 0701 - 12/07 0700 In: 2160 [P.O.:2160] Out: [id number] [Urine:3375] Weight Filed Weights   03/07/15 2024 03/08/15 0352 03/09/15 0407  Weight: 91.445 kg (201 lb 9.6 oz) 90.175 kg (198 lb 12.8 oz) 88.27 kg (194 lb 9.6 oz)    Lab Results: Basic Metabolic Panel:  Recent Labs  03/08/15 0400 03/09/15 0408  NA 144 137  K 3.1* 3.9  CL 100* 94*  CO2 33* 33*  GLUCOSE 62* 134*  BUN 38* 31*  CREATININE 1.57* 1.56*    CBC:  Recent Labs  03/07/15 2030  WBC 8.3  NEUTROABS 6.4  HGB 9.2*  HCT 31.4*  MCV 77.3*  PLT 215    BNP    Component Value Date/Time   BNP 3397.5* 03/07/2015 2030   BNP 1076.5* 09/30/2014 1119    PROTIME: Lab Results  Component Value Date   INR 1.28 03/07/2015    Telemetry: Sinus rhythm with PVCs, one run of nonsustained ventricular tachycardia noted  Assessment/Plan:  1.  Acute on chronic systolic heart failure-diuresing well. 2.  Stage 3-4 chronic kidney disease 3.  Anemia which is new and appears to be iron deficiency-not great candidate for GI workup 4.  Left bundle branch block with mild dyssynchrony but  intermittent and not present on admission 5.  Diabetes mellitus  Recommendations:  Add iron to regimen.  Continue intravenous diuresis.  Cutback dose of Entresto because of low blood pressure.  Wound care consultation.   Kerry Hough  MD [facility]  03/09/2015, 8:52 AM

## 2015-03-09 NOTE — Research (Signed)
REDS_0  Informed Consent   Subject Name: Jimmy Dawson  Subject met inclusion and exclusion criteria.  The informed consent form, study requirements and expectations were reviewed with the subject and questions and concerns were addressed prior to the signing of the consent form.  The subject verbalized understanding of the trail requirements.  The subject agreed to participate in the REDS_1  trial and signed the informed consent.  The informed consent was obtained prior to performance of any protocol-specific procedures for the subject.  A copy of the signed informed consent was given to the subject and a copy was placed in the subject's medical record.  Sandie Ano 03/09/2015, 10:34 AM

## 2015-03-10 DIAGNOSIS — L8990 Pressure ulcer of unspecified site, unspecified stage: Secondary | ICD-10-CM | POA: Insufficient documentation

## 2015-03-10 LAB — BASIC METABOLIC PANEL
Anion gap: 10 (ref 5–15)
BUN: 29 mg/dL — AB (ref 6–20)
CO2: 32 mmol/L (ref 22–32)
CREATININE: 1.31 mg/dL — AB (ref 0.61–1.24)
Calcium: 8.7 mg/dL — ABNORMAL LOW (ref 8.9–10.3)
Chloride: 95 mmol/L — ABNORMAL LOW (ref 101–111)
GFR calc Af Amer: 56 mL/min — ABNORMAL LOW (ref >60)
GFR, EST NON AFRICAN AMERICAN: 48 mL/min — AB (ref >60)
GLUCOSE: 128 mg/dL — AB (ref 65–99)
Potassium: 3.8 mmol/L (ref 3.5–5.1)
SODIUM: 137 mmol/L (ref 135–145)

## 2015-03-10 LAB — GLUCOSE, CAPILLARY
GLUCOSE-CAPILLARY: 132 mg/dL — AB (ref 65–99)
GLUCOSE-CAPILLARY: 151 mg/dL — AB (ref 65–99)
Glucose-Capillary: 157 mg/dL — ABNORMAL HIGH (ref 65–99)

## 2015-03-10 MED ORDER — FERROUS SULFATE 325 (65 FE) MG PO TABS
325.0000 mg | ORAL_TABLET | Freq: Three times a day (TID) | ORAL | Status: DC
  Administered 2015-03-10 – 2015-03-12 (×8): 325 mg via ORAL
  Filled 2015-03-10 (×8): qty 1

## 2015-03-10 NOTE — Progress Notes (Signed)
I stopped in to discuss HF education and recommendations for home.  Patient was not in the room.  I will plan to return and speak with him at another time.

## 2015-03-10 NOTE — Progress Notes (Signed)
Subjective:  Feeling better.  Continues to diurese.  Weight is now down a total of 14 pounds but still significant edema.  No chest pain.  Discussed medical compliance at home and there may be some financial barriers.  I'm going to arrange home health care as well as ask for case manager to assist with patient.  Objective:  Vital Signs in the last 24 hours: BP 120/86 mmHg  Pulse 91  Temp(Src) 97.2 F (36.2 C) (Oral)  Resp 20  Ht 5\' 6"  (1.676 m)  Wt 84.868 kg (187 lb 1.6 oz)  BMI 30.21 kg/m2  SpO2 96%  Physical Exam: Elderly male somewhat hard of hearing in no acute distress Lungs:  Reduced breath sounds at bases Cardiac:  Regular rhythm, normal S1 and S2, no S3, 2/6 systolic murmur over aortic valve Abdomen:  Soft, nontender, no masses, distended Extremities:  2+ edema now, penile edema gone  In addition bandage over ulcer on front of the leg.    Intake/Output from previous day: 12/07 0701 - 12/08 0700 In: 1780 [P.O.:1780] Out: 4900 [Urine:4900] Weight Filed Weights   03/08/15 0352 03/09/15 0407 03/10/15 0518  Weight: 90.175 kg (198 lb 12.8 oz) 88.27 kg (194 lb 9.6 oz) 84.868 kg (187 lb 1.6 oz)    Lab Results: Basic Metabolic Panel:  Recent Labs  03/09/15 0408 03/10/15 0214  NA 137 137  K 3.9 3.8  CL 94* 95*  CO2 33* 32  GLUCOSE 134* 128*  BUN 31* 29*  CREATININE 1.56* 1.31*    CBC:  Recent Labs  03/07/15 2030  WBC 8.3  NEUTROABS 6.4  HGB 9.2*  HCT 31.4*  MCV 77.3*  PLT 215    BNP    Component Value Date/Time   BNP 3397.5* 03/07/2015 2030   BNP 1076.5* 09/30/2014 1119    PROTIME: Lab Results  Component Value Date   INR 1.28 03/07/2015    Telemetry: Sinus rhythm with PVCs, one run of nonsustained ventricular tachycardia noted  Assessment/Plan:  1.  Acute on chronic systolic heart failure-diuresing well still volume overloaded with a way to go. 2.  Stage 3 chronic kidney disease-improving with diuresis and reinstitution of therapy 3.   Anemia which is new and appears to be iron deficiency-not great candidate for GI workup 4.  Left bundle branch block with mild dyssynchrony but intermittent and not present on admission 5.  Diabetes mellitus  Recommendations:  Blood pressure is better with reduction in Entresto dose.  Still has a ways to go with diuresis.  Wound care consult appreciated.  He will need continued intravenous diuresis.  We'll request home health and case manager to see patient.  Home when back to dry weight which was probably below 180.  I will need to see back within a week after discharge.  [facility] will see tomorrow.   Kerry Hough  MD [facility]  03/10/2015, 8:55 AM

## 2015-03-10 NOTE — Evaluation (Signed)
Physical Therapy Evaluation Patient Details Name: Jimmy Dawson MRN: [id number] DOB: 08/08/1928 Today's Date: 03/10/2015   History of Present Illness  Nagee RAIZO CAZES is a 81 y.o. male admitted to [facility] on 03/07/15 with CHF, fluid OL. He has PMHx of CAD and previous stenting to LAD in 2002,more recently noted to have worsening EF and cath Feb 2016 with patent stent and no obstructive disease  Clinical Impression  Patient presents with decreased mobility due to general deconditioning related to disease and hospitalization and will benefit from skilled PT in the acute setting to allow return home with wife and HHPT for at least home safety eval.  Patient with cataract R eye and peripheral neuropathy with fall risk.      Follow Up Recommendations Home health PT    Equipment Recommendations  None recommended by PT    Recommendations for Other Services       Precautions / Restrictions Precautions Precautions: Fall Restrictions Weight Bearing Restrictions: No      Mobility  Bed Mobility               General bed mobility comments: in chair  Transfers     Transfers: Sit to/from Stand Sit to Stand: Supervision         General transfer comment: used arms on chair for sit to stand  Ambulation/Gait Ambulation/Gait assistance: Supervision Ambulation Distance (Feet): 200 Feet Assistive device: Rolling walker (2 wheeled) Gait Pattern/deviations: Step-through pattern;Decreased stride length;Trunk flexed     General Gait Details: in room able to walk short distance no device with minguard to supervision, then requested walker once informed walking down hall; demonstrates increased proximity to walker and flexed posture  Stairs Stairs: Yes Stairs assistance: Min guard Stair Management: Step to pattern;Forwards;One rail Right Number of Stairs: 10 General stair comments: one hand on rail  Wheelchair Mobility    Modified Rankin (Stroke Patients Only)       Balance  Overall balance assessment: Needs assistance         Standing balance support: No upper extremity supported Standing balance-Leahy Scale: Fair Standing balance comment: able to move in room no device, but prefers walker for longer distances                             Pertinent Vitals/Pain Pain Assessment: No/denies pain    Home Living Family/patient expects to be discharged to:: Private residence Living Arrangements: Spouse/significant other Available Help at Discharge: Family;Available 24 hours/day Type of Home: House Home Access: Stairs to enter Entrance Stairs-Rails: Psychiatric nurse of Steps: 5 Home Layout: Two level;Full bath on main level (goes upstairs to get medicine) Home Equipment: Bedside commode;Shower seat;Walker - 2 wheels      Prior Function Level of Independence: Independent         Comments: Pt reports equipment is from his brother-in-law     Hand Dominance   Dominant Hand: Right    Extremity/Trunk Assessment   Upper Extremity Assessment: Defer to OT evaluation           Lower Extremity Assessment: RLE deficits/detail;LLE deficits/detail RLE Deficits / Details: AROM WFL  strength grossly 4+/5 LLE Deficits / Details: AROM WFL  strength grossly 4+/5  Cervical / Trunk Assessment: Kyphotic  Communication   Communication: HOH (mild dysarthria reports due to h/o "sun stroke")  Cognition Arousal/Alertness: Awake/alert Behavior During Therapy: WFL for tasks assessed/performed Overall Cognitive Status: Within Functional Limits for tasks assessed  Memory: Decreased short-term memory (trouble recalling names of places and MD specialty )              General Comments General comments (skin integrity, edema, etc.): sore place on R LE reports improving and occurred when wearing rubber boots for an hour and he was unaware of it.    Exercises        Assessment/Plan    PT Assessment Patient needs  continued PT services  PT Diagnosis Generalized weakness   PT Problem List Decreased strength;Decreased balance;Decreased mobility;Decreased safety awareness;Decreased knowledge of use of DME  PT Treatment Interventions DME instruction;Balance training;Gait training;Stair training;Functional mobility training;Patient/family education;Therapeutic activities;Therapeutic exercise   PT Goals (Current goals can be found in the Care Plan section) Acute Rehab PT Goals Patient Stated Goal: To go home PT Goal Formulation: With patient Time For Goal Achievement: 03/17/15 Potential to Achieve Goals: Good    Frequency Min 3X/week   Barriers to discharge        Co-evaluation               End of Session Equipment Utilized During Treatment: Gait belt Activity Tolerance: Patient tolerated treatment well Patient left: in chair;with call bell/phone within reach           Time: 1310-1340 PT Time Calculation (min) (ACUTE ONLY): 30 min   Charges:   PT Evaluation $Initial PT Evaluation Tier I: 1 Procedure PT Treatments $Gait Training: 8-22 mins   PT G Codes:        Kenneith Stief,CYNDI March 30, 2015, 2:32 PM  Magda Kiel, [place] Mar 30, 2015

## 2015-03-10 NOTE — Care Management Important Message (Signed)
Important Message  Patient Details  Name: Jimmy Dawson MRN: [id number] Date of Birth: 1928-09-21   Medicare Important Message Given:  Yes    Sreeja Spies P [place] 03/10/2015, 11:16 AM

## 2015-03-10 NOTE — Evaluation (Signed)
Occupational Therapy Evaluation Patient Details Name: Jimmy Dawson MRN: [id number] DOB: May 19, 1928 Today's Date: 03/10/2015    History of Present Illness Jimmy Dawson is a 81 y.o. male admitted to [facility] on 03/07/15 with CHF, fluid OL. He has PMHx of CAD and previous stenting to LAD in 2002,more recently noted to have worsening EF and cath Feb 2016 with patent stent and no obstructive disease   Clinical Impression   Patient presenting with decreased independence and deconditioning. Patient independent PTA per his report. Patient currently functioning at an overall min to mod assist level. Patient will benefit from acute OT to increase overall independence in the areas of ADLs, functional mobility, and overall safety in order to safely discharge home with wife.     Follow Up Recommendations  Home health OT;Supervision/Assistance - 24 hour    Equipment Recommendations  None recommended by OT    Recommendations for Other Services  None at this time   Precautions / Restrictions Precautions Precautions: Fall Restrictions Weight Bearing Restrictions: No    Mobility Bed Mobility  General bed mobility comments: Pt found seated on EOB upon OT entering/exiting room  Transfers Overall transfer level: Needs assistance Equipment used: None Transfers: Sit to/from Stand Sit to Stand: Min assist General transfer comment: Pt states he was not using RW PTA, pt may benefit from use of LRAD for safety with mobility     Balance Overall balance assessment: Needs assistance Sitting-balance support: No upper extremity supported;Feet supported Sitting balance-Leahy Scale: Good     Standing balance support: No upper extremity supported;During functional activity Standing balance-Leahy Scale: Poor    ADL Overall ADL's : Needs assistance/impaired Eating/Feeding: Set up;Sitting   Grooming: Set up;Sitting   Upper Body Bathing: Minimal assitance;Sitting   Lower Body Bathing: Moderate  assistance;Sit to/from stand   Upper Body Dressing : Minimal assistance;Sitting   Lower Body Dressing: Maximal assistance;Sit to/from stand   Toilet Transfer: Minimal assistance;Comfort height toilet;Grab bars Toilet Transfer Details (indicate cue type and reason): encouraged patient to use BSC over toilet seat for safety and increased independence          Functional mobility during ADLs: Minimal assistance;Cueing for safety General ADL Comments: No AD used, patient may benefit from RW for safety with functional mobility. Patient with kyphotic posture and tends to furniture walk, holding onto walls and doorframes.     Pertinent Vitals/Pain Pain Assessment: No/denies pain     Hand Dominance Right   Extremity/Trunk Assessment Upper Extremity Assessment Upper Extremity Assessment: Overall WFL for tasks assessed   Lower Extremity Assessment Lower Extremity Assessment: Defer to PT evaluation   Cervical / Trunk Assessment Cervical / Trunk Assessment: Kyphotic   Communication Communication Communication: HOH   Cognition Arousal/Alertness: Awake/alert Behavior During Therapy: WFL for tasks assessed/performed Overall Cognitive Status: Within Functional Limits for tasks assessed              Home Living Family/patient expects to be discharged to:: Private residence Living Arrangements: Spouse/significant other Available Help at Discharge: Family;Available 24 hours/day Type of Home: House Home Access: Stairs to enter [employer] of Steps: 5 Entrance Stairs-Rails: Right;Left;Can reach both (pt reports he tends to use the left side) Home Layout: Two level;Full bath on main level Alternate Level Stairs-Number of Steps: flight Alternate Level Stairs-Rails: Right Bathroom Shower/Tub: Tub/shower unit;Walk-in shower;Curtain;Door (tub/shower w/ curtain upstairs and walk-in downstairs)   Bathroom Toilet: Handicapped height     Home Equipment: Bedside commode;Shower  seat;Walker - 2 wheels   Additional Comments:  pt reports having no problem getting upstairs to 2nd floor to shower and take medications      Prior Functioning/Environment Level of Independence: Independent  Comments: Pt reports equipment is from his brother-in-law    OT Diagnosis: Generalized weakness   OT Problem List: Decreased strength;Decreased activity tolerance;Impaired balance (sitting and/or standing);Decreased safety awareness;Decreased knowledge of use of DME or AE   OT Treatment/Interventions: Self-care/ADL training;Therapeutic exercise;Energy conservation;DME and/or AE instruction;Therapeutic activities;Patient/family education;Balance training    OT Goals(Current goals can be found in the care plan section) Acute Rehab OT Goals Patient Stated Goal: none stated OT Goal Formulation: With patient Time For Goal Achievement: 03/24/15 Potential to Achieve Goals: Good ADL Goals Pt Will Perform Grooming: with modified independence;standing Pt Will Perform Lower Body Bathing: with modified independence;sit to/from stand (using AE prn) Pt Will Perform Lower Body Dressing: with modified independence;sit to/from stand (using AE prn) Pt Will Transfer to Toilet: with modified independence;ambulating;bedside commode Pt Will Perform Tub/Shower Transfer: Tub transfer;ambulating;with modified independence Additional ADL Goal #1: Pt will be mod I with functional mobility using LRAD Additional ADL Goal #2: Pt will be educated on energy conservation techniques and patient will be able to verbalize at least 3 energy conservation techniques   OT Frequency: Min 2X/week   Barriers to D/C: none known at this time   End of Session Nurse Communication: Other (comment) (notified NT of patient's whereabouts and that patient ready for bath)  Activity Tolerance: Patient tolerated treatment well Patient left: in bed;with call bell/phone within reach;with chair alarm set (seated EOB)   Time:  [id number] OT Time Calculation (min): 19 min Charges:  OT General Charges $OT Visit: 1 Procedure OT Evaluation $Initial OT Evaluation Tier I: 1 Procedure  Caprice Mccaffrey , MS, OTR/L, CLT Pager: [phone]  03/10/2015, 9:42 AM

## 2015-03-11 LAB — BASIC METABOLIC PANEL
Anion gap: 10 (ref 5–15)
BUN: 26 mg/dL — ABNORMAL HIGH (ref 6–20)
CHLORIDE: 93 mmol/L — AB (ref 101–111)
CO2: 35 mmol/L — AB (ref 22–32)
CREATININE: 1.52 mg/dL — AB (ref 0.61–1.24)
Calcium: 8.8 mg/dL — ABNORMAL LOW (ref 8.9–10.3)
GFR calc non Af Amer: 40 mL/min — ABNORMAL LOW (ref >60)
GFR, EST AFRICAN AMERICAN: 47 mL/min — AB (ref >60)
Glucose, Bld: 118 mg/dL — ABNORMAL HIGH (ref 65–99)
POTASSIUM: 3.8 mmol/L (ref 3.5–5.1)
SODIUM: 138 mmol/L (ref 135–145)

## 2015-03-11 LAB — GLUCOSE, CAPILLARY
GLUCOSE-CAPILLARY: 131 mg/dL — AB (ref 65–99)
GLUCOSE-CAPILLARY: 125 mg/dL — AB (ref 65–99)
GLUCOSE-CAPILLARY: 153 mg/dL — AB (ref 65–99)
GLUCOSE-CAPILLARY: 105 mg/dL — AB (ref 65–99)

## 2015-03-11 MED ORDER — FUROSEMIDE 10 MG/ML IJ SOLN
80.0000 mg | Freq: Two times a day (BID) | INTRAMUSCULAR | Status: DC
  Administered 2015-03-11: 80 mg via INTRAVENOUS
  Filled 2015-03-11: qty 8

## 2015-03-11 MED ORDER — FUROSEMIDE 40 MG PO TABS
40.0000 mg | ORAL_TABLET | Freq: Two times a day (BID) | ORAL | Status: DC
  Administered 2015-03-11 – 2015-03-12 (×2): 40 mg via ORAL
  Filled 2015-03-11 (×2): qty 1

## 2015-03-11 NOTE — Progress Notes (Signed)
Occupational Therapy Treatment Patient Details Name: Jimmy Dawson MRN: [id number] DOB: 08-21-1928 Today's Date: 03/11/2015    History of present illness Jimmy Dawson is a 81 y.o. male admitted to [facility] on 03/07/15 with CHF, fluid OL. He has PMHx of CAD and previous stenting to LAD in 2002,more recently noted to have worsening EF and cath Feb 2016 with patent stent and no obstructive disease   OT comments  Pt educated on fall risk with tub transfer. Pt currently declines DME and wants "grab bar" installed. Pt has access to walk in shower with seat but insist on use of tub upstairs. HHOT for home safety eval could decr fall risk at home.    Follow Up Recommendations  Home health OT;Supervision/Assistance - 24 hour    Equipment Recommendations  Other (comment) (cane)    Recommendations for Other Services      Precautions / Restrictions Precautions Precautions: Fall Restrictions Weight Bearing Restrictions: No       Mobility Bed Mobility               General bed mobility comments: sitting on eob drinking coffee on arrival  Transfers Overall transfer level: Needs assistance Equipment used: None Transfers: Sit to/from Stand Sit to Stand: Supervision              Balance Overall balance assessment: Needs assistance         Standing balance support: No upper extremity supported;During functional activity Standing balance-Leahy Scale: Fair                     ADL Overall ADL's : Needs assistance/impaired                                 Tub/ Shower Transfer: Minimal assistance;Cueing for sequencing;Ambulation Tub/Shower Transfer Details (indicate cue type and reason): pt states "if i have one of those bars i can do It" Pt with trash can on side adn sink used as a bar to simulate. OT providing min (A). OT educated patient on using walk in shower on first floor but pt insist on using upstairs tub shower. Pt able to demonstrate static  standing with reaching top of heads ( simulate hair washing) Functional mobility during ADLs: Minimal assistance General ADL Comments: Pt walking holding onto furniture and advised on fall risk upon d/c home. Pt states" when I leave here first thing I am going to do is go to [employer] to look at canes" OT requesting CM address cane needs for d/c home.       Vision                     Perception     Praxis      Cognition   Behavior During Therapy: WFL for tasks assessed/performed Overall Cognitive Status: Within Functional Limits for tasks assessed                       Extremity/Trunk Assessment               Exercises     Shoulder Instructions       General Comments      Pertinent Vitals/ Pain       Pain Assessment: No/denies pain  Home Living  Prior Functioning/Environment              Frequency Min 2X/week     Progress Toward Goals  OT Goals(current goals can now be found in the care plan section)  Progress towards OT goals: Progressing toward goals  Acute Rehab OT Goals Patient Stated Goal: To go home OT Goal Formulation: With patient Time For Goal Achievement: 03/24/15 Potential to Achieve Goals: Good ADL Goals Pt Will Perform Grooming: with modified independence;standing Pt Will Perform Lower Body Bathing: with modified independence;sit to/from stand Pt Will Perform Lower Body Dressing: with modified independence;sit to/from stand Pt Will Transfer to Toilet: with modified independence;ambulating;bedside commode Pt Will Perform Tub/Shower Transfer: Tub transfer;ambulating;with modified independence Additional ADL Goal #1: Pt will be mod I with functional mobility using DME as needed Additional ADL Goal #2: Pt will be educated on energy conservation techniques and patient will be able to verbalize at least 3 energy conservation techniques   Plan Discharge plan remains  appropriate    Co-evaluation                 End of Session Equipment Utilized During Treatment: Gait belt   Activity Tolerance Patient tolerated treatment well   Patient Left in bed;with call bell/phone within reach   Nurse Communication Mobility status;Precautions        Time: [id number] OT Time Calculation (min): 21 min  Charges: OT General Charges $OT Visit: 1 Procedure OT Treatments $Self Care/Home Management : 8-22 mins  Parke Poisson B 03/11/2015, 9:51 AM  Jeri Modena   OTR/L Pager: [phone] Office: [phone] .

## 2015-03-11 NOTE — Progress Notes (Signed)
Patient Name: Jimmy Dawson Date of Encounter: 03/11/2015  Active Problems:   Acute on chronic systolic (congestive) heart failure ([place])   Acute on chronic renal failure ([place])   Essential hypertension   Pressure ulcer    Primary Cardiologist: Dr. Wynonia Lawman Patient Profile: 81 yo male w/ PMH of HTN, HLD, Stage 3 CKD, CAD, known LBBB, moderate AS, and chronic combined systolic and diastolic CHF (EF [id number], Grade 3 DD by echo in 04/2014) admitted on 03/07/2015 for acute CHF exacerbation.  SUBJECTIVE: Reports improvement in his breathing, but still not at baseline. Edema has improved. Denies any chest pain or palpitations.  OBJECTIVE Filed Vitals:   03/10/15 2029 03/11/15 0001 03/11/15 0442 03/11/15 0755  BP: 91/60 94/61 94/72  99/57  Pulse: 74 71 78 77  Temp: 98 F (36.7 C)  97.8 F (36.6 C)   TempSrc: Oral  Oral   Resp: 18  18   Height:      Weight:   182 lb 8.7 oz (82.8 kg)   SpO2: 98%  98%     Intake/Output Summary (Last 24 hours) at 03/11/15 0911 Last data filed at 03/11/15 0809  Gross per 24 hour  Intake   1080 ml  Output   5125 ml  Net  -4045 ml   Filed Weights   03/09/15 0407 03/10/15 0518 03/11/15 0442  Weight: 194 lb 9.6 oz (88.27 kg) 187 lb 1.6 oz (84.868 kg) 182 lb 8.7 oz (82.8 kg)    PHYSICAL EXAM General: Well developed, well nourished, Caucasian male in no acute distress. Head: Normocephalic, atraumatic.  Neck: Supple without bruits, JVD not elevated. Lungs:  Resp regular and unlabored, Minimal rales at bases bilaterally. Heart: RRR, S1, S2, no S3, S4, 2/6 SEM at RUSB; no rub. Abdomen: Soft, non-tender, non-distended with normoactive bowel sounds. No hepatomegaly. No rebound/guarding. No obvious abdominal masses. Extremities: No clubbing or cyanosis. 1+ edema bilaterally. Distal pedal pulses are 2+ bilaterally. Neuro: Alert and oriented X 3. Moves all extremities spontaneously. Psych: Normal affect.   LABS: Basic Metabolic Panel: Recent Labs  03/10/15 0214 03/11/15 0332  NA 137 138  K 3.8 3.8  CL 95* 93*  CO2 32 35*  GLUCOSE 128* 118*  BUN 29* 26*  CREATININE 1.31* 1.52*  CALCIUM 8.7* 8.8*   BNP:  B NATRIURETIC PEPTIDE  Date/Time Value Ref Range Status  03/07/2015 08:30 PM 3397.5* 0.0 - 100.0 pg/mL Final   BNP  Date/Time Value Ref Range Status  09/30/2014 11:19 AM 1076.5* 0.0 - 100.0 pg/mL Final  06/29/2014 08:54 AM 214.2* 0.0 - 100.0 pg/mL Final   Anemia Panel: Recent Labs  03/08/15 1030  VITAMINB12 493  FOLATE 23.4  FERRITIN 26  TIBC 370  IRON 15*  RETICCTPCT 1.5    TELE:  NSR with rate in 70's - 80's. No atopic events.     Current Medications:  . aspirin EC  81 mg Oral Daily  . carvedilol  3.125 mg Oral BID WC  . enoxaparin (LOVENOX) injection  40 mg Subcutaneous Q24H  . ferrous sulfate  325 mg Oral TID WC  . furosemide  80 mg Intravenous BID  . insulin aspart  0-15 Units Subcutaneous TID WC  . ivabradine  5 mg Oral BID WC  . sacubitril-valsartan  1 tablet Oral BID  . sodium chloride  3 mL Intravenous Q12H  . spironolactone  12.5 mg Oral Daily      ASSESSMENT AND PLAN:  1. Acute on chronic systolic (congestive) heart failure (  [place]) - EF 35%, Grade 3 DD by echo in 04/2014. Echo performed in office by Dr. Wynonia Lawman on 03/07/2015 and showed EF of 20%. - had stopped taking Corlanor and Entresto PTA. Had recent 20 pound weight gain with significant edema in lower extremities and scrotal edema. - Net output of -11.2L since admission. - Weight down from 201 lbs at time of admission to 182 lbs on 03/11/2015. Baseline weight at or below 180lbs. - Has been receiving Lasix 80mg  IV BID. Can likely decrease to 40mg  IV BID and switch to PO in the next 24-48 hours. - continue BB, Corlanor, and Entresto.  2. Stage 3 CKD - baseline 1.3 -1.5. - creatinine at 1.52 on 03/11/2015 - continue to monitor with daily BMET  3. Essential hypertension - has been soft at 80/50 - 99/72 in the past 24 hours. Delene Loll  dosage recently decreased on 03/09/2015. May need to further titrate down or decrease Spironolactone dosage. Will likely improve once switched from IV to PO Lasix.  4. Pressure ulcer - wound care following   Signed, Erma Heritage , PA-C 9:11 AM 03/11/2015 Pager: [phone]

## 2015-03-12 LAB — BASIC METABOLIC PANEL
ANION GAP: 8 (ref 5–15)
BUN: 28 mg/dL — ABNORMAL HIGH (ref 6–20)
CO2: 33 mmol/L — ABNORMAL HIGH (ref 22–32)
Calcium: 8.6 mg/dL — ABNORMAL LOW (ref 8.9–10.3)
Chloride: 95 mmol/L — ABNORMAL LOW (ref 101–111)
Creatinine, Ser: 1.45 mg/dL — ABNORMAL HIGH (ref 0.61–1.24)
GFR, EST AFRICAN AMERICAN: 49 mL/min — AB (ref >60)
GFR, EST NON AFRICAN AMERICAN: 43 mL/min — AB (ref >60)
GLUCOSE: 120 mg/dL — AB (ref 65–99)
POTASSIUM: 4.1 mmol/L (ref 3.5–5.1)
Sodium: 136 mmol/L (ref 135–145)

## 2015-03-12 LAB — GLUCOSE, CAPILLARY
GLUCOSE-CAPILLARY: 101 mg/dL — AB (ref 65–99)
Glucose-Capillary: 202 mg/dL — ABNORMAL HIGH (ref 65–99)

## 2015-03-12 MED ORDER — FERROUS SULFATE 325 (65 FE) MG PO TABS: 325.0000 mg | ORAL_TABLET | Freq: Three times a day (TID) | ORAL | Status: DC

## 2015-03-12 MED ORDER — FUROSEMIDE 40 MG PO TABS
40.0000 mg | ORAL_TABLET | Freq: Two times a day (BID) | ORAL | Status: DC
Start: 2015-03-12 — End: 2015-10-19

## 2015-03-12 MED ORDER — SACUBITRIL-VALSARTAN 24-26 MG PO TABS: 1.0000 | ORAL_TABLET | Freq: Two times a day (BID) | ORAL | Status: DC

## 2015-03-12 NOTE — Research (Signed)
ReDS Vest Discharge Study  Your patient is in the Blinded arm of the Vest at Discharge study.  Your patient has had a ReDS Vest reading and the reading has been transmitted to the cloud.  Your patient is ok for discharge.    Thank You   The research team   

## 2015-03-12 NOTE — Discharge Summary (Signed)
Physician Discharge Summary   Cardiologist: Wynonia Lawman  Patient ID: Jimmy Dawson MRN: [id number] DOB/AGE: 1928-08-08 81 y.o.  Admit date: 03/07/2015 Discharge date: 03/12/2015  Admission Diagnoses:  Acute on chronic combined systolic and diastolic heart failure  Discharge Diagnoses:  Active Problems:   Acute on chronic mind systolic and diastolic (congestive) heart failure ([facility])   Acute on chronic stage 3-4 renal failure ([facility])   Essential hypertension   Pressure ulcer   Iron deficiency anemia   Diabetes mellitus   Bundle branch block   Pressure ulcer  Discharged Condition: stable  Hospital Course:   The patient is an 81 year old male who was admitted to the hospital for worsening edema in the setting of increased diuretics and heart failure. The patient has a history of coronary artery disease with a stent in the LAD in 2002. He is also diabetic and hypertensive and hyperlipidemia. He developed congestive heart failure earlier this year and had a drop in his ejection fraction to 25% as well as a left bundle. At catheterization he was found to have a pain LAD stent, minimal disease in the RCA and the circumflex and a moderate ostial left main stenosis. He did not have any clinical angina. There was minimal gradient across the aortic valve. He was treated intensively for heart failure and medically. He had persistent tachycardia and Corlanor was added to his regimen and later Delene Loll was added to his regimen and titrated up. When last seen he was doing quite well without much in the way of edema. For unexplained reasons he stopped taking both Corlanor and Entresto about 3 weeks ago. He has subsequently had a 20 pound weight gain and despite increasing his diuretics on an outpatient basis to 80 mg twice a day he has had worsening edema, PND and has now developed penile and scrotal edema. He is hard of hearing and also has somewhat poor comprehension of his overall medical issues. Dr. Wynonia Lawman  felt it best to admit him to the hospital for intensive diuresis in light of his anasarca and previous poor medical compliance.  Patient did well with IV diuretics.  He ultimately diuresed 13.3 L and weight was down to baseline of about 181 pounds.  Medical therapy was limited due to soft blood pressures. He had systolic readings in the [id number] the night before discharge. His Aldactone was discontinued.  Lasix was changed to by mouth.  Beta blocker, Entresto were continued. He was started on iron for iron deficiency anemia. Serum creatinine remained stable.  He was enrolled in the REDS@Discharge  trial.  Will arrange for home health wound care and physical therapy. The patient was seen by Dr. Harl Bowie who felt he was stable for DC home.    Consults: Wound care, physical therapy, occupational therapy  Significant Diagnostic Studies:  CHEST 2 VIEW  COMPARISON: 04/26/2014  FINDINGS: Heart is mildly enlarged. There is mild pulmonary vascular congestion but no overt edema. There are no focal consolidations or pleural effusions.  IMPRESSION: Cardiomegaly. Vascular congestion.  BMET    Component Value Date/Time   NA 136 03/12/2015 0229   K 4.1 03/12/2015 0229   CL 95* 03/12/2015 0229   CO2 33* 03/12/2015 0229   GLUCOSE 120* 03/12/2015 0229   BUN 28* 03/12/2015 0229   CREATININE 1.45* 03/12/2015 0229   CALCIUM 8.6* 03/12/2015 0229   GFRNONAA 43* 03/12/2015 0229   GFRAA 49* 03/12/2015 0229    Treatments: See above  Discharge Exam: Blood pressure 110/70, pulse 80, temperature 98.2 F (36.8  C), temperature source Oral, resp. rate 18, height 5\' 6"  (1.676 m), weight 181 lb 14.1 oz (82.5 kg), SpO2 98 %.   Disposition: 01-Home or Self Care      Discharge Instructions    Diet - low sodium heart healthy    Complete by:  As directed      Discharge instructions    Complete by:  As directed   Monitor your weight every morning.  If you gain 3 pounds in 24 hours, or 5 pounds in a week, call  the office for instructions.     Increase activity slowly    Complete by:  As directed             Medication List    STOP taking these medications        ibuprofen 200 MG tablet  Commonly known as:  ADVIL,MOTRIN     losartan-hydrochlorothiazide 100-25 MG tablet  Commonly known as:  HYZAAR     spironolactone 25 MG tablet  Commonly known as:  ALDACTONE      TAKE these medications        aspirin 81 MG tablet  Take 81 mg by mouth at bedtime.     carvedilol 3.125 MG tablet  Commonly known as:  COREG  TAKE 1 TABLET (3.125 MG TOTAL) BY MOUTH 2 (TWO) TIMES DAILY WITH A MEAL.     ferrous sulfate 325 (65 FE) MG tablet  Take 1 tablet (325 mg total) by mouth 3 (three) times daily with meals.     furosemide 40 MG tablet  Commonly known as:  LASIX  Take 1 tablet (40 mg total) by mouth 2 (two) times daily.     glimepiride 2 MG tablet  Commonly known as:  AMARYL  TAKE 1 TABLET BY MOUTH EVERY DAY WITH BREAKFAST TO CONTROL DIABETES     metFORMIN 500 MG tablet  Commonly known as:  GLUCOPHAGE  TAKE 2 TABLETS (1000MG ) BY MOUTH TWICE A DAY WITH MEALS     multivitamin tablet  Take 1 tablet by mouth daily.     omeprazole 40 MG capsule  Commonly known as:  PRILOSEC  TAKE ONE CAPSULE BY MOUTH TWICE A DAY FOR ACID REDUCTION     sacubitril-valsartan 24-26 MG  Commonly known as:  ENTRESTO  Take 1 tablet by mouth 2 (two) times daily.     silver sulfADIAZINE 1 % cream  Commonly known as:  SILVADENE  Cleanse wound daily then apply cream to the wound and cover with gauze     simvastatin 40 MG tablet  Commonly known as:  ZOCOR  TAKE ONE TABLET BY MOUTH ONCE DAILY FOR CHOLESTEROL       Follow-up Information    Schedule an appointment as soon as possible for a visit with Ezzard Standing, MD.   Specialty:  Cardiology   Contact information:   [place] [address] [phone]      Greater than 30 minutes was spent completing the patient's  discharge.    SignedTarri Fuller, [place] 03/12/2015, 10:41 AM

## 2015-03-12 NOTE — Progress Notes (Signed)
Discharge instructions reviewed with patient and wife.  These included CHF instructions, dietary restrictions, medication, prescriptions, medication schedule, when to call the physician, wound care instructions, mobility restrictions, and information re:  Home health care.  Patient discharged via wheelchair to private residence accompanied by spouse.  Spouse and patient escorted to exit via wheelchair.  Wife appeared to have a good understanding of instructions AEB appropriate responses to questions.

## 2015-03-12 NOTE — Progress Notes (Signed)
Patient ID: Jimmy Dawson, male   DOB: 26-Mar-1929, 81 y.o.   MRN: [id number]     Subjective:    SOB has improved.   Objective:   Temp:  [97.7 F (36.5 C)-98.2 F (36.8 C)] 98.2 F (36.8 C) (12/10 0559) Pulse Rate:  [70-86] 80 (12/10 0559) Resp:  [16-18] 18 (12/10 0559) BP: (72-113)/(40-72) 110/70 mmHg (12/10 0559) SpO2:  [91 %-98 %] 98 % (12/10 0559) Weight:  [181 lb 14.1 oz (82.5 kg)] 181 lb 14.1 oz (82.5 kg) (12/10 0559) Last BM Date: 03/12/15  Filed Weights   03/10/15 0518 03/11/15 0442 03/12/15 0559  Weight: 187 lb 1.6 oz (84.868 kg) 182 lb 8.7 oz (82.8 kg) 181 lb 14.1 oz (82.5 kg)    Intake/Output Summary (Last 24 hours) at 03/12/15 0942 Last data filed at 03/12/15 0900  Gross per 24 hour  Intake    720 ml  Output   2775 ml  Net  -2055 ml    Telemetry: SR  Exam:  General: NAD  Resp: CTAB  Cardiac: RRR, no m/r/g, no jvd  GI: abdomen soft, NT, ND  MSK: no LE edema  Neuro: no focal deficits  Psych: appropriate affect  Lab Results:  Basic Metabolic Panel:  Recent Labs Lab 03/10/15 0214 03/11/15 0332 03/12/15 0229  NA 137 138 136  K 3.8 3.8 4.1  CL 95* 93* 95*  CO2 32 35* 33*  GLUCOSE 128* 118* 120*  BUN 29* 26* 28*  CREATININE 1.31* 1.52* 1.45*  CALCIUM 8.7* 8.8* 8.6*    Liver Function Tests:  Recent Labs Lab 03/07/15 2030  AST 42*  ALT 39  ALKPHOS 92  BILITOT 0.6  PROT 6.3*  ALBUMIN 3.3*    CBC:  Recent Labs Lab 03/07/15 2030  WBC 8.3  HGB 9.2*  HCT 31.4*  MCV 77.3*  PLT 215    Cardiac Enzymes: No results for input(s): CKTOTAL, CKMB, CKMBINDEX, TROPONINI in the last 168 hours.  BNP: No results for input(s): PROBNP in the last 8760 hours.  Coagulation:  Recent Labs Lab 03/07/15 2030  INR 1.28     Medications:   Scheduled Medications: . aspirin EC  81 mg Oral Daily  . carvedilol  3.125 mg Oral BID WC  . enoxaparin (LOVENOX) injection  40 mg Subcutaneous Q24H  . ferrous sulfate  325 mg Oral TID WC  .  furosemide  40 mg Oral BID  . insulin aspart  0-15 Units Subcutaneous TID WC  . ivabradine  5 mg Oral BID WC  . sacubitril-valsartan  1 tablet Oral BID  . sodium chloride  3 mL Intravenous Q12H  . spironolactone  12.5 mg Oral Daily     Infusions:     PRN Medications:  sodium chloride, acetaminophen, ondansetron (ZOFRAN) IV, sodium chloride     Assessment/Plan    1. Acute on chronic systolic HF/NICM - EF [id number], Grade 3 DD by echo in 04/2014. Echo performed in office by Dr. Wynonia Lawman on 03/07/2015 and showed EF of 20%. 05/2014 MPI with evidence of prior infarct anterior and inferior - cath 05/2014 without obstructive disease - had stopped taking Corlanor and Entresto PTA. Had recent 20 pound weight gain with significant edema in lower extremities and scrotal edema. - Net output of -13.3 liters since admission. - Weight down from 201 lbs at time of admission, baseline low 180s where is currently is back to 181 lbs.  - medical therapy has been limited due to soft bp's overnight. SBPs in 70s last  night, we will stop his aldactone at this time, preferably continue beta blocker and entresto.   - d/c IV lasix today, resume lasix at home. Appears issue was more compliance with diuretic as oppsed to treatment failure, will continue lasix 40mg  bid.     2. Pressure ulcer - followed by wound care - will see what needs to be done at home about this.   Will need f/u with Dr Wynonia Lawman in 2 weeks.   Carlyle Dolly, M.D.

## 2015-03-14 ENCOUNTER — Other Ambulatory Visit: Payer: Federal, State, Local not specified - PPO

## 2015-03-16 ENCOUNTER — Ambulatory Visit: Payer: Federal, State, Local not specified - PPO | Admitting: Internal Medicine

## 2015-03-16 ENCOUNTER — Ambulatory Visit (INDEPENDENT_AMBULATORY_CARE_PROVIDER_SITE_OTHER): Payer: Federal, State, Local not specified - PPO | Admitting: Internal Medicine

## 2015-03-16 VITALS — BP 118/78 | HR 100 | Temp 98.0°F | Ht 66.0 in | Wt 191.6 lb

## 2015-03-16 DIAGNOSIS — I5042 Chronic combined systolic (congestive) and diastolic (congestive) heart failure: Secondary | ICD-10-CM | POA: Diagnosis not present

## 2015-03-16 DIAGNOSIS — N182 Chronic kidney disease, stage 2 (mild): Secondary | ICD-10-CM

## 2015-03-16 DIAGNOSIS — R413 Other amnesia: Secondary | ICD-10-CM | POA: Diagnosis not present

## 2015-03-16 DIAGNOSIS — E1122 Type 2 diabetes mellitus with diabetic chronic kidney disease: Secondary | ICD-10-CM

## 2015-03-16 DIAGNOSIS — IMO0002 Reserved for concepts with insufficient information to code with codable children: Secondary | ICD-10-CM

## 2015-03-16 DIAGNOSIS — E1165 Type 2 diabetes mellitus with hyperglycemia: Secondary | ICD-10-CM

## 2015-03-16 DIAGNOSIS — N183 Chronic kidney disease, stage 3 unspecified: Secondary | ICD-10-CM

## 2015-03-16 MED ORDER — SACUBITRIL-VALSARTAN 24-26 MG PO TABS: 1.0000 | ORAL_TABLET | Freq: Two times a day (BID) | ORAL | Status: DC

## 2015-03-16 NOTE — Progress Notes (Signed)
Patient ID: PADRAIC MARINOS, male   DOB: [birth year], 81 y.o.   MRN: [id number]    Facility  [facility]    Place of Service:   OFFICE    No Known Allergies  Chief Complaint  Patient presents with  . Hospitalization Follow-up    Hospital Follow up due to CHF. "16 quarts of fluid off body" was at [facility], just got out last Saturday. Confused about his medication list, stated he couldn't swear his medication list is right or wrong. Did not bring mediation bottles.     HPI:   hospitalized 03/07/2015 through 03/12/2015 for acute on chronic combined systolic and diastolic heart failure. Patient was diuresed with intravenous diuretics about 13 L comment which time his weight was 181 pounds.   He was started on iron for iron deficiency anemia   He had been treated with Entresto in the past, but had stopped this medication. It was resumed. Medications that were stopped included losartan/HCTZ and spironolactone 25 mg daily. He was put on carvedilol 3.125 mg twice daily follow furosemide 40 mg twice daily   At discharge, Home care with wound care for a shallow wound of the right leg and for physical therapy was recommended.  Medications: Patient's Medications  New Prescriptions   No medications on file  Previous Medications   ASPIRIN 81 MG TABLET    Take 81 mg by mouth at bedtime.    CARVEDILOL (COREG) 3.125 MG TABLET    TAKE 1 TABLET (3.125 MG TOTAL) BY MOUTH 2 (TWO) TIMES DAILY WITH A MEAL.   FERROUS SULFATE 325 (65 FE) MG TABLET    Take 1 tablet (325 mg total) by mouth 3 (three) times daily with meals.   FUROSEMIDE (LASIX) 40 MG TABLET    Take 1 tablet (40 mg total) by mouth 2 (two) times daily.   GLIMEPIRIDE (AMARYL) 2 MG TABLET    TAKE 1 TABLET BY MOUTH EVERY DAY WITH BREAKFAST TO CONTROL DIABETES   METFORMIN (GLUCOPHAGE) 500 MG TABLET    TAKE 2 TABLETS ('1000MG'$ ) BY MOUTH TWICE A DAY WITH MEALS   MULTIPLE VITAMIN (MULTIVITAMIN) TABLET    Take 1 tablet by mouth daily.    OMEPRAZOLE (PRILOSEC) 40 MG  CAPSULE    TAKE ONE CAPSULE BY MOUTH TWICE A DAY FOR ACID REDUCTION   SACUBITRIL-VALSARTAN (ENTRESTO) 24-26 MG    Take 1 tablet by mouth 2 (two) times daily.   SILVER SULFADIAZINE (SILVADENE) 1 % CREAM    Cleanse wound daily then apply cream to the wound and cover with gauze   SIMVASTATIN (ZOCOR) 40 MG TABLET    TAKE ONE TABLET BY MOUTH ONCE DAILY FOR CHOLESTEROL  Modified Medications   No medications on file  Discontinued Medications   No medications on file    Review of Systems  Constitutional: Negative.  Negative for fever, chills, diaphoresis, activity change, appetite change and fatigue.  HENT: Positive for hearing loss. Negative for congestion, ear pain, nosebleeds, rhinorrhea, sinus pressure and tinnitus.        Wears prescription lenses. Otherwise negative in regards to symptoms. Wax in both EAC and impacted in right ear.  Eyes:       Wears prescription lenses. Negative for all other symptoms.  Respiratory: Positive for chest tightness and shortness of breath. Negative for cough, choking and wheezing.   Cardiovascular: Positive for leg swelling. Negative for chest pain and palpitations.       History of combined systolic and diastolic congestive heart failure.  Gastrointestinal:  Negative for abdominal pain, diarrhea, constipation and blood in stool.  Endocrine: Negative for cold intolerance, heat intolerance, polydipsia, polyphagia and polyuria.  Genitourinary: Negative for dysuria, urgency, frequency, flank pain, decreased urine volume, enuresis and difficulty urinating.       Penile edema  Musculoskeletal: Negative for myalgias, back pain, arthralgias and gait problem.  Skin:       1.5 x 4" wound of the right leg with heavy scabbing. Although there is mild surrounding erythema, there is no significant purulence or signs of infection. Leg is not warm to touch.  Allergic/Immunologic: Negative.   Neurological: Negative for dizziness, tremors, speech difficulty, weakness,  light-headedness, numbness and headaches.       Patient says he is aware that his memory is slipping. 06/30/2014 MMSE 30/30. Passed clock drawing.  Hematological: Negative.   Psychiatric/Behavioral: Positive for confusion.    Filed Vitals:   03/16/15 1438  BP: 118/78  Pulse: 100  Temp: 98 F (36.7 C)  TempSrc: Oral  Height: '5\' 6"'$  (1.676 m)  Weight: 191 lb 9.6 oz (86.909 kg)  SpO2: 97%   Body mass index is 30.94 kg/(m^2).  Physical Exam  Constitutional: He is oriented to person, place, and time. He appears well-developed and well-nourished.  HENT:  Significant hearing loss bilaterally. Interferes with conversation.  Eyes:  Wears prescription lenses, otherwise normal.  Neck: Neck supple. No JVD present. No tracheal deviation present. No thyromegaly present.  Cardiovascular: Normal rate, regular rhythm, normal heart sounds and intact distal pulses.  Exam reveals no gallop.   No murmur heard. Pulmonary/Chest: No respiratory distress. He has no wheezes. He has rales (Moist rales in bibasilar lung areas). He exhibits no tenderness.  Abdominal: Soft. Bowel sounds are normal. He exhibits no mass. There is no tenderness.  Genitourinary:  Prior penile edema has resolved.  Musculoskeletal: He exhibits edema (1-2+ bipedal).  Chronic back discomfort with movement.  Lymphadenopathy:    He has no cervical adenopathy.  Neurological: He is alert and oriented to person, place, and time. No cranial nerve deficit. Coordination normal.  Wife has reported issues with his memory in the past. He now says that he is aware of some memory slippage. He does not feel that it incapacitates him in any way.  Skin: Skin is warm and dry. No rash noted. No erythema.  Lesion on the right shoulder that may be a ringworm. 1.5 x 4" lesion of the right anterior foreleg which does not appear infected although there is a mild surrounding erythema.  Psychiatric: He has a normal mood and affect. His behavior is  normal. Judgment and thought content normal.    Labs reviewed: Lab Summary Latest Ref Rng 03/12/2015 03/11/2015 03/10/2015 03/09/2015 03/08/2015 03/07/2015  Hemoglobin 13.0 - 17.0 g/dL (None) (None) (None) (None) (None) 9.2(L)  Hematocrit 39.0 - 52.0 % (None) (None) (None) (None) (None) 31.4(L)  White count 4.0 - 10.5 K/uL (None) (None) (None) (None) (None) 8.3  Platelet count 150 - 400 K/uL (None) (None) (None) (None) (None) 215  Sodium 135 - 145 mmol/L 136 138 137 137 144 144  Potassium 3.5 - 5.1 mmol/L 4.1 3.8 3.8 3.9 3.1(L) 3.5  Calcium 8.9 - 10.3 mg/dL 8.6(L) 8.8(L) 8.7(L) 8.8(L) 8.9 9.0  Phosphorus - (None) (None) (None) (None) (None) (None)  Creatinine 0.61 - 1.24 mg/dL 1.45(H) 1.52(H) 1.31(H) 1.56(H) 1.57(H) 1.93(H)  AST 15 - 41 U/L (None) (None) (None) (None) (None) 42(H)  Alk Phos 38 - 126 U/L (None) (None) (None) (None) (None) 92  Bilirubin 0.3 -  1.2 mg/dL (None) (None) (None) (None) (None) 0.6  Glucose 65 - 99 mg/dL 120(H) 118(H) 128(H) 134(H) 62(L) 97  Cholesterol - (None) (None) (None) (None) (None) (None)  HDL cholesterol - (None) (None) (None) (None) (None) (None)  Triglycerides - (None) (None) (None) (None) (None) (None)  LDL Direct - (None) (None) (None) (None) (None) (None)  LDL Calc - (None) (None) (None) (None) (None) (None)  Total protein 6.5 - 8.1 g/dL (None) (None) (None) (None) (None) 6.3(L)  Albumin 3.5 - 5.0 g/dL (None) (None) (None) (None) (None) 3.3(L)   Lab Results  Component Value Date   TSH 2.092 03/07/2015   TSH 1.454 *Test methodology is 3rd generation TSH* 06/27/2007   Lab Results  Component Value Date   BUN 28* 03/12/2015   BUN 26* 03/11/2015   BUN 29* 03/10/2015   Lab Results  Component Value Date   HGBA1C 6.7* 03/07/2015   HGBA1C 6.6* 02/28/2015   HGBA1C 6.1* 09/30/2014    Assessment/Plan  1. Chronic combined systolic and diastolic congestive heart failure ([place]) patient needs to remain on resent medication. Previous attempt to keep him  on Entresto failed due to the cost of the medication. He was referred today to Bertell Maria, Chief Executive Officer to [employer].  2. Stage III chronic kidney disease -CMP, future  3. Mild memory disturbance monnitor  4. Uncontrolled type 2 diabetes mellitus with stage 2 chronic kidney disease, without long-term current use of insulin (HCC) Stable and improved

## 2015-03-18 ENCOUNTER — Telehealth: Payer: Self-pay | Admitting: *Deleted

## 2015-03-18 NOTE — Telephone Encounter (Signed)
Hinton Dyer and Mardene Celeste, Reps for [employer], walked me through the Cover My Meds Prior Authorization for medication. Medication went into clinical review and will know something in 48-72 hours. Reps gave me a 28 day sample supply to give to patient.  Tried calling patient and LMOM to return call.

## 2015-03-18 NOTE — Telephone Encounter (Signed)
Dr. Nyoka Cowden received verbal approval from patient at his appointment that it would be ok for a Rep from [facility] to call him.  Dr. Nyoka Cowden gave me the information to have Rep call patient. I called and spoke with Bertell Maria with [employer] [phone] and she is going to check with Hinton Dyer her salesrep to make sure it is ok to talk with patient. They are going to come by the office today.

## 2015-03-18 NOTE — Telephone Encounter (Signed)
Spoke with wife, Tamela Oddi and she stated patient has not taken his Rx to the pharmacy yet. Informed her that i have initiated prior auth and i have samples for him. She will pick up next week.

## 2015-04-12 NOTE — Telephone Encounter (Addendum)
[facility] through Cover my Meds called, Altha Harm and wanted to help with prior authorization for patient's Jimmy Dawson [phone].  Called and spoke with Inez Catalina and form will be faxed to complete and fax back.

## 2015-04-12 NOTE — Telephone Encounter (Signed)
Jimmy Dawson with [facility] called back and stated that she called and spoke with Jimmy's insurance company and the medication was APPROVED from 09/19/14 to Indefinite.  Jimmy Dawson notified and agreed.

## 2015-04-18 ENCOUNTER — Other Ambulatory Visit: Payer: Medicare Other

## 2015-04-18 DIAGNOSIS — I5022 Chronic systolic (congestive) heart failure: Secondary | ICD-10-CM

## 2015-04-18 DIAGNOSIS — R0602 Shortness of breath: Secondary | ICD-10-CM

## 2015-04-18 DIAGNOSIS — R609 Edema, unspecified: Secondary | ICD-10-CM

## 2015-04-18 DIAGNOSIS — N182 Chronic kidney disease, stage 2 (mild): Secondary | ICD-10-CM

## 2015-04-18 DIAGNOSIS — E1165 Type 2 diabetes mellitus with hyperglycemia: Secondary | ICD-10-CM

## 2015-04-18 DIAGNOSIS — IMO0002 Reserved for concepts with insufficient information to code with codable children: Secondary | ICD-10-CM

## 2015-04-18 DIAGNOSIS — E1122 Type 2 diabetes mellitus with diabetic chronic kidney disease: Secondary | ICD-10-CM

## 2015-04-19 LAB — CBC WITH DIFFERENTIAL
BASOS ABS: 0.1 10*3/uL (ref 0.0–0.2)
Basos: 1 %
EOS (ABSOLUTE): 0.6 10*3/uL — AB (ref 0.0–0.4)
Eos: 7 %
Hematocrit: 36.6 % — ABNORMAL LOW (ref 37.5–51.0)
Hemoglobin: 11.7 g/dL — ABNORMAL LOW (ref 12.6–17.7)
IMMATURE GRANULOCYTES: 0 %
Immature Grans (Abs): 0 10*3/uL (ref 0.0–0.1)
LYMPHS ABS: 2.6 10*3/uL (ref 0.7–3.1)
Lymphs: 31 %
MCH: 24.3 pg — AB (ref 26.6–33.0)
MCHC: 32 g/dL (ref 31.5–35.7)
MCV: 76 fL — ABNORMAL LOW (ref 79–97)
Monocytes Absolute: 0.5 10*3/uL (ref 0.1–0.9)
Monocytes: 6 %
NEUTROS ABS: 4.5 10*3/uL (ref 1.4–7.0)
NEUTROS PCT: 55 %
RBC: 4.82 x10E6/uL (ref 4.14–5.80)
RDW: 19.4 % — AB (ref 12.3–15.4)
WBC: 8.2 10*3/uL (ref 3.4–10.8)

## 2015-04-19 LAB — BRAIN NATRIURETIC PEPTIDE: BNP: 2307.6 pg/mL — AB (ref 0.0–100.0)

## 2015-04-19 LAB — BASIC METABOLIC PANEL
BUN / CREAT RATIO: 26 — AB (ref 10–22)
BUN: 33 mg/dL — ABNORMAL HIGH (ref 8–27)
CHLORIDE: 101 mmol/L (ref 96–106)
CO2: 26 mmol/L (ref 18–29)
Calcium: 9.6 mg/dL (ref 8.6–10.2)
Creatinine, Ser: 1.25 mg/dL (ref 0.76–1.27)
GFR calc Af Amer: 61 mL/min/{1.73_m2} (ref >59)
GFR calc non Af Amer: 53 mL/min/{1.73_m2} — ABNORMAL LOW (ref >59)
GLUCOSE: 85 mg/dL (ref 65–99)
POTASSIUM: 3.9 mmol/L (ref 3.5–5.2)
Sodium: 146 mmol/L — ABNORMAL HIGH (ref 134–144)

## 2015-04-20 ENCOUNTER — Encounter: Payer: Self-pay | Admitting: Internal Medicine

## 2015-04-20 ENCOUNTER — Ambulatory Visit (INDEPENDENT_AMBULATORY_CARE_PROVIDER_SITE_OTHER): Payer: Federal, State, Local not specified - PPO | Admitting: Internal Medicine

## 2015-04-20 VITALS — BP 110/72 | HR 107 | Temp 97.6°F | Resp 20 | Ht 66.0 in | Wt 189.2 lb

## 2015-04-20 DIAGNOSIS — E1165 Type 2 diabetes mellitus with hyperglycemia: Secondary | ICD-10-CM

## 2015-04-20 DIAGNOSIS — N182 Chronic kidney disease, stage 2 (mild): Secondary | ICD-10-CM | POA: Diagnosis not present

## 2015-04-20 DIAGNOSIS — R0600 Dyspnea, unspecified: Secondary | ICD-10-CM | POA: Diagnosis not present

## 2015-04-20 DIAGNOSIS — I5022 Chronic systolic (congestive) heart failure: Secondary | ICD-10-CM | POA: Diagnosis not present

## 2015-04-20 DIAGNOSIS — N183 Chronic kidney disease, stage 3 unspecified: Secondary | ICD-10-CM

## 2015-04-20 DIAGNOSIS — E1122 Type 2 diabetes mellitus with diabetic chronic kidney disease: Secondary | ICD-10-CM

## 2015-04-20 DIAGNOSIS — I5042 Chronic combined systolic (congestive) and diastolic (congestive) heart failure: Secondary | ICD-10-CM

## 2015-04-20 DIAGNOSIS — IMO0002 Reserved for concepts with insufficient information to code with codable children: Secondary | ICD-10-CM

## 2015-04-20 MED ORDER — SACUBITRIL-VALSARTAN 24-26 MG PO TABS: ORAL_TABLET | ORAL | Status: DC

## 2015-04-20 NOTE — Progress Notes (Signed)
Patient ID: Jimmy Dawson, male   DOB: July 07, 1928, 82 y.o.   MRN: [id number]    Facility  [place]    Place of Service:   OFFICE    No Known Allergies  Chief Complaint  Patient presents with  . Medical Management of Chronic Issues    HPI:   Chronic systolic heart failure [facility]): Breathing easier. Swelling has improved. No longer swollen in the genitalia. Trace edema in the lower legs  Uncontrolled type 2 diabetes mellitus with stage 2 chronic kidney disease, without long-term current use of insulin ([place]): good control  Stage III chronic kidney disease: stable. He reduced furosemide to 160 mg daily     Medications: Patient's Medications  New Prescriptions   No medications on file  Previous Medications   ASPIRIN 81 MG TABLET    Take 81 mg by mouth at bedtime.    CARVEDILOL (COREG) 3.125 MG TABLET    TAKE 1 TABLET (3.125 MG TOTAL) BY MOUTH 2 (TWO) TIMES DAILY WITH A MEAL.   FERROUS SULFATE 325 (65 FE) MG TABLET    Take 1 tablet (325 mg total) by mouth 3 (three) times daily with meals.   FUROSEMIDE (LASIX) 40 MG TABLET    Take 1 tablet (40 mg total) by mouth 2 (two) times daily.   GLIMEPIRIDE (AMARYL) 2 MG TABLET    TAKE 1 TABLET BY MOUTH EVERY DAY WITH BREAKFAST TO CONTROL DIABETES   METFORMIN (GLUCOPHAGE) 500 MG TABLET    TAKE 2 TABLETS ('1000MG'$ ) BY MOUTH TWICE A DAY WITH MEALS   MULTIPLE VITAMIN (MULTIVITAMIN) TABLET    Take 1 tablet by mouth daily.    OMEPRAZOLE (PRILOSEC) 40 MG CAPSULE    TAKE ONE CAPSULE BY MOUTH TWICE A DAY FOR ACID REDUCTION   SACUBITRIL-VALSARTAN (ENTRESTO) 24-26 MG    Take 1 tablet by mouth 2 (two) times daily.   SILVER SULFADIAZINE (SILVADENE) 1 % CREAM    Cleanse wound daily then apply cream to the wound and cover with gauze   SIMVASTATIN (ZOCOR) 40 MG TABLET    TAKE ONE TABLET BY MOUTH ONCE DAILY FOR CHOLESTEROL  Modified Medications   No medications on file  Discontinued Medications   SPIRONOLACTONE (ALDACTONE) 25 MG TABLET    Take 25 mg by mouth  daily.    Review of Systems  Constitutional: Negative.  Negative for fever, chills, diaphoresis, activity change, appetite change and fatigue.  HENT: Positive for hearing loss. Negative for congestion, ear pain, nosebleeds, rhinorrhea, sinus pressure and tinnitus.        Wears prescription lenses. Otherwise negative in regards to symptoms. Wax in both EAC and impacted in right ear.  Eyes:       Wears prescription lenses. Negative for all other symptoms.  Respiratory: Positive for shortness of breath. Negative for cough, choking, chest tightness and wheezing.   Cardiovascular: Positive for leg swelling. Negative for chest pain and palpitations.       History of combined systolic and diastolic congestive heart failure.  Gastrointestinal: Negative for abdominal pain, diarrhea, constipation and blood in stool.  Endocrine: Negative for cold intolerance, heat intolerance, polydipsia, polyphagia and polyuria.  Genitourinary: Negative for dysuria, urgency, frequency, flank pain, decreased urine volume, enuresis and difficulty urinating.       Penile edema  Musculoskeletal: Negative for myalgias, back pain, arthralgias and gait problem.  Skin:       Healed 1.5 x 4" wound of the right leg. Has raw area at sacrum.  Allergic/Immunologic: Negative.  Neurological: Negative for dizziness, tremors, speech difficulty, weakness, light-headedness, numbness and headaches.       Patient says he is aware that his memory is slipping. 06/30/2014 MMSE 30/30. Passed clock drawing.  Hematological: Negative.   Psychiatric/Behavioral: Positive for confusion.    Filed Vitals:   04/20/15 1127  BP: 110/72  Pulse: 107  Temp: 97.6 F (36.4 C)  TempSrc: Oral  Resp: 20  Height: '5\' 6"'$  (1.676 m)  Weight: 189 lb 3.2 oz (85.821 kg)  SpO2: 93%   Body mass index is 30.55 kg/(m^2). Filed Weights   04/20/15 1127  Weight: 189 lb 3.2 oz (85.821 kg)     Physical Exam  Constitutional: He is oriented to person,  place, and time. He appears well-developed and well-nourished.  HENT:  Significant hearing loss bilaterally. Interferes with conversation.  Eyes:  Wears prescription lenses, otherwise normal.  Neck: Neck supple. No JVD present. No tracheal deviation present. No thyromegaly present.  Cardiovascular: Normal rate, regular rhythm, normal heart sounds and intact distal pulses.  Exam reveals no gallop.   No murmur heard. Pulmonary/Chest: No respiratory distress. He has no wheezes. He has rales (Moist rales in bibasilar lung areas). He exhibits no tenderness.  Diminished sounds in the right lung.  Abdominal: Soft. Bowel sounds are normal. He exhibits no mass. There is no tenderness.  Genitourinary:  Prior penile edema has resolved.  Musculoskeletal: He exhibits edema (1-2+ bipedal).  Chronic back discomfort with movement.  Lymphadenopathy:    He has no cervical adenopathy.  Neurological: He is alert and oriented to person, place, and time. No cranial nerve deficit. Coordination normal.  Wife has reported issues with his memory in the past. He now says that he is aware of some memory slippage. He does not feel that it incapacitates him in any way.  Skin: Skin is warm and dry. No rash noted. No erythema.  Raw area at the sacrum.  Psychiatric: He has a normal mood and affect. His behavior is normal. Judgment and thought content normal.    Labs reviewed: Lab Summary Latest Ref Rng 04/18/2015 03/12/2015 03/11/2015 03/10/2015 03/09/2015 03/08/2015  Hemoglobin 12.6 - 17.7 g/dL 11.7(L) (None) (None) (None) (None) (None)  Hematocrit 37.5 - 51.0 % 36.6(L) (None) (None) (None) (None) (None)  White count 3.4 - 10.8 x10E3/uL 8.2 (None) (None) (None) (None) (None)  Platelet count - (None) (None) (None) (None) (None) (None)  Sodium 134 - 144 mmol/L 146(H) 136 138 137 137 144  Potassium 3.5 - 5.2 mmol/L 3.9 4.1 3.8 3.8 3.9 3.1(L)  Calcium 8.6 - 10.2 mg/dL 9.6 8.6(L) 8.8(L) 8.7(L) 8.8(L) 8.9  Phosphorus - (None)  (None) (None) (None) (None) (None)  Creatinine 0.76 - 1.27 mg/dL 1.25 1.45(H) 1.52(H) 1.31(H) 1.56(H) 1.57(H)  AST - (None) (None) (None) (None) (None) (None)  Alk Phos - (None) (None) (None) (None) (None) (None)  Bilirubin - (None) (None) (None) (None) (None) (None)  Glucose 65 - 99 mg/dL 85 120(H) 118(H) 128(H) 134(H) 62(L)  Cholesterol - (None) (None) (None) (None) (None) (None)  HDL cholesterol - (None) (None) (None) (None) (None) (None)  Triglycerides - (None) (None) (None) (None) (None) (None)  LDL Direct - (None) (None) (None) (None) (None) (None)  LDL Calc - (None) (None) (None) (None) (None) (None)  Total protein - (None) (None) (None) (None) (None) (None)  Albumin - (None) (None) (None) (None) (None) (None)   Lab Results  Component Value Date   TSH 2.092 03/07/2015   TSH 1.454 *Test methodology is 3rd generation TSH* 06/27/2007  Lab Results  Component Value Date   BUN 33* 04/18/2015   BUN 28* 03/12/2015   BUN 26* 03/11/2015   Lab Results  Component Value Date   HGBA1C 6.7* 03/07/2015   HGBA1C 6.6* 02/28/2015   HGBA1C 6.1* 09/30/2014    Assessment/Plan  1. Chronic systolic heart failure (HCC) improved - Brain natriuretic peptide; Future  2. Uncontrolled type 2 diabetes mellitus with stage 2 chronic kidney disease, without long-term current use of insulin ([place]) controlled - Hemoglobin A1c; Future - Basic metabolic panel; Future - Microalbumin, urine; Future  3. Stage III chronic kidney disease - Basic metabolic panel; Future  4. Dyspnea Related to CHF  5. Chronic combined systolic and diastolic congestive heart failure (HCC) - Brain natriuretic peptide; Future - sacubitril-valsartan (ENTRESTO) 24-26 MG; One twice daily to strengthen the heart  Dispense: 60 tablet; Refill: 5

## 2015-05-05 ENCOUNTER — Encounter: Payer: Self-pay | Admitting: Cardiology

## 2015-05-05 NOTE — Progress Notes (Signed)
Patient ID: Jimmy Dawson, male   DOB: [birth year], 82 y.o.   MRN: [id number]   Jimmy Dawson, [place]    Date of visit:  05/05/2015 DOB:  [birth year]    Age:  82 yrs. Medical record number:  [id number]     Account number:  [id number] Primary Care Provider: GREEN III, ARTHUR G ____________________________ CURRENT DIAGNOSES  1. Dyspnea  2. Chronic systolic heart failure  3. Hypertensive heart disease without heart failure  4. CAD Native without angina  5. Aortic valve disorder, unspecified  6. Left bundle-branch block  7. Hyperlipidemia  8. Chronic kidney disease, stage 3 (moderate)  9. Type 2 diabetes mellitus without complications  10. Presence of coronary angioplasty implant and graft  11. Gastro-esophageal reflux disease without esophagitis ____________________________ ALLERGIES  No Known Allergies ____________________________ MEDICATIONS  1. omeprazole 40 mg Capsule, Delayed Release(E.C.), 1 p.o. daily  2. multivitamin Tablet, 1 p.o. daily  3. simvastatin 40 mg tablet, 1 p.o. daily  4. Metamucil 3.4 gram/12 gram Powder, qd  5. aspirin 81 mg chewable tablet, 1 p.o. daily  6. metformin 500 mg tablet, 2 p.o. b.i.d.  7. glimepiride 2 mg tablet, 1 p.o. daily  8. carvedilol 3.125 mg tablet, BID  9. Colace 100 mg capsule, PRN  10. ibuprofen 200 mg tablet, PRN  11. spironolactone 25 mg tablet, 1 p.o. daily  12. furosemide 40 mg tablet, 2 p.o. daily  13. Entresto 24 mg-26 mg tablet, BID  14. ferrous sulfate 325 mg (65 mg iron) tablet, TID  15. Corlanor 5 mg tablet, BID ____________________________ CHIEF COMPLAINTS  Followup of Chronic systolic heart failure ____________________________ HISTORY OF PRESENT ILLNESS Patient seen for cardiac followup. He comes in today saying that his heating unit when out and he was up all night trying to take care of it. He brings his medicine and and is not taking the Corlanor. He appears to be taking diuretics but had some edema today and his weight is up about 8  pounds. Communication and getting him to understand his problem has been quite difficult. He does have intermittent left bundle-branch block the EP doctor did not feel he would benefit from resynchronization. His breathing is better and he has no PND or orthopnea. He is not currently having angina. Says he is weighing at home. ____________________________ PAST HISTORY  Past Medical Illnesses:  hypertension, DM-non-insulin dependent, hyperlipidemia, obesity, GERD, BPH, TIA;  Cardiovascular Illnesses:  CHF-left, chronic systolic heart failure, CAD;  Surgical Procedures:  TURP;  NYHA Classification:  II;  Canadian Angina Classification:  Class 0: Asymptomatic;  Cardiology Procedures-Invasive:  Penta stent February 2002 Dr. Glade Lloyd, cardiac cath (left) February 2016;  Cardiology Procedures-Noninvasive:  no previous non-invasive cardiovascular testing;  Cardiac Cath Results:  40% ostiall Left main, 40% proximal LAD, patent stent mid LAD, 95% stenosis proximal Diag 1, scattered irregularities CFX, scattered irregularities RCA;  LVEF of 20% documented via echocardiogram on 03/07/2015,   ____________________________ CARDIO-PULMONARY TEST DATES EKG Date:  10/26/2014;   Cardiac Cath Date:  05/24/2014;  Stent Placement Date: 05/08/2000;  Nuclear Study Date:  05/06/2014;  Echocardiography Date: 03/07/2015;  Chest Xray Date: 03/07/2015;   ____________________________ FAMILY HISTORY Brother -- Brother dead, Myocardial infarction Brother -- Brother dead, Unknown disease Brother -- Brother dead, Unknown disease Father -- Father dead, Myocardial infarction Mother -- Mother dead, Diabetes mellitus, Coronary Artery Disease ____________________________ SOCIAL HISTORY Alcohol Use:  does not use alcohol;  Smoking:  nonsmoker;  Lifestyle:  widower and remarried 12 years;  Exercise:  no regular exercise;  Occupation:  Park Ranger-retired;  Residence:  lives with wife;   ____________________________ REVIEW OF  SYSTEMS General:  weight gain of approximately 5 lbs  Integumentary:recent skin cancer Eyes: decreased acuity O.D. Respiratory: denies dyspnea, cough, wheezing or hemoptysis. Cardiovascular:  please review HPI Abdominal: denies dyspepsia, GI bleeding, constipation, or diarrhea Genitourinary-Male: frequency, nocturia, penile edema  Musculoskeletal:  chronic low back pain  ____________________________ PHYSICAL EXAMINATION VITAL SIGNS  Blood Pressure:  118/70 Sitting, Left arm, regular cuff  , 124/72 Standing, Left arm and regular cuff   Pulse:  100/min. Weight:  187.00 lbs. Height:  69"BMI: 27  Constitutional:  pleasant white male in no acute distress, mildly obese Skin:  warm and dry to touch, no apparent skin lesions, or masses noted. Head:  normocephalic, normal hair pattern, no masses or tenderness Eyes:  strabismus Neck:  no bruits, no masses, non-tender, JVD at 45 degrees 6cm Chest:  normal symmetry, clear to auscultation. Cardiac:  regular rhythm, normal S1 and S2, no murmur, grade 1/6 systolic murmur at aortic area radiating to neck Peripheral Pulses:  femoral pulses 2+, posterior tibial pulses 3+ Extremities & Back:  mild bilateral venous insufficiency changes present, 1+ edema Neurological:  no gross motor or sensory deficits noted, affect appropriate, oriented x3. ____________________________ MOST RECENT LIPID PANEL 04/26/14  CHOL TOTL 146 mg/dl, LDL 84 NM, HDL 40 mg/dl, TRIGLYCER 108 mg/dl and CHOL/HDL 3.7 (Calc) ____________________________ IMPRESSIONS/PLAN  1. Chronic systolic heart failure with 8 pound weight gain since here 2. Mild to moderate aortic stenosis 3. Coronary artery disease with previous stents 3. Stage III chronic kidney disease  Recommendations:  He does have edema today and I recommended that he increase his furosemide 80 mg in the morning and 40 in the afternoon until his weight goes down to at least 5 pounds. I believe that he understands this. Samples  were given of Corlanor 5 mg to take twice a day. He is tachycardic today. Getting him to be compliant with his regimen and understanding it has been difficult. Followup in one month. Metabolic panel evaluated today. ____________________________ TODAYS ORDERS  1. Return Visit: 1 month  2. Basic Metabolic Panel: Today                       ____________________________ Cardiology Physician:  Kerry Hough MD [facility]

## 2015-05-16 ENCOUNTER — Other Ambulatory Visit: Payer: Self-pay | Admitting: Internal Medicine

## 2015-06-06 ENCOUNTER — Telehealth: Payer: Self-pay | Admitting: Internal Medicine

## 2015-06-06 ENCOUNTER — Other Ambulatory Visit: Payer: Self-pay | Admitting: Internal Medicine

## 2015-06-06 DIAGNOSIS — I5042 Chronic combined systolic (congestive) and diastolic (congestive) heart failure: Secondary | ICD-10-CM

## 2015-06-06 MED ORDER — SACUBITRIL-VALSARTAN 24-26 MG PO TABS: ORAL_TABLET | ORAL | Status: DC

## 2015-06-06 NOTE — Telephone Encounter (Signed)
This drug is already on his medication list. It is a chronic medication. Please send an appropriate prescription to where he needs it.

## 2015-06-06 NOTE — Telephone Encounter (Signed)
Jimmy Dawson came into the office on today.  Wants to discuss [employer].  Says it was discussed doing an office visit.  Says [employer] contact him, stated this medication is now covered under His precription plan. Would like a Rx, discuss if necessary.  [facility] Name [facility] [place].Marland Kitchen Or call him at [phone]. Please forward response to [facility] or triage person. cdavis

## 2015-06-06 NOTE — Telephone Encounter (Signed)
Medication sent to pharmacy  

## 2015-06-15 ENCOUNTER — Other Ambulatory Visit: Payer: Self-pay | Admitting: Internal Medicine

## 2015-06-15 DIAGNOSIS — R413 Other amnesia: Secondary | ICD-10-CM

## 2015-06-15 DIAGNOSIS — I5042 Chronic combined systolic (congestive) and diastolic (congestive) heart failure: Secondary | ICD-10-CM

## 2015-06-15 MED ORDER — SACUBITRIL-VALSARTAN 24-26 MG PO TABS: ORAL_TABLET | ORAL | Status: DC

## 2015-06-26 ENCOUNTER — Other Ambulatory Visit: Payer: Self-pay | Admitting: Internal Medicine

## 2015-07-04 ENCOUNTER — Encounter: Payer: Self-pay | Admitting: Diagnostic Neuroimaging

## 2015-07-04 ENCOUNTER — Ambulatory Visit (INDEPENDENT_AMBULATORY_CARE_PROVIDER_SITE_OTHER): Payer: Federal, State, Local not specified - PPO | Admitting: Diagnostic Neuroimaging

## 2015-07-04 VITALS — BP 129/76 | HR 98 | Ht 66.0 in | Wt 191.0 lb

## 2015-07-04 DIAGNOSIS — R413 Other amnesia: Secondary | ICD-10-CM

## 2015-07-04 DIAGNOSIS — I6930 Unspecified sequelae of cerebral infarction: Secondary | ICD-10-CM

## 2015-07-04 DIAGNOSIS — Z8673 Personal history of transient ischemic attack (TIA), and cerebral infarction without residual deficits: Secondary | ICD-10-CM

## 2015-07-04 DIAGNOSIS — F0390 Unspecified dementia without behavioral disturbance: Secondary | ICD-10-CM | POA: Diagnosis not present

## 2015-07-04 DIAGNOSIS — F03A0 Unspecified dementia, mild, without behavioral disturbance, psychotic disturbance, mood disturbance, and anxiety: Secondary | ICD-10-CM

## 2015-07-04 NOTE — Progress Notes (Signed)
[facility]  PATIENT: Jimmy Dawson DOB: 31-Aug-1928  REFERRING CLINICIAN: A Green HISTORY FROM: patient and wife  REASON FOR VISIT: new consult   HISTORICAL  CHIEF COMPLAINT:  Chief Complaint  Patient presents with  . Mild memory disturbance    rm 6, New Pt, wife- [place], MMSE 23    HISTORY OF PRESENT ILLNESS:   82 year old right-handed male here for evaluation of memory loss. Patient denies any significant memory problems. When asked patient why his doctor sent him here, he went on a long-winded explanation about his driving ability and how he gets angry when he gets in the car due to comments from his wife and then he ends up speaking too fast. When asked specifically about his memory he feels that he has some mild memory problems but not that severe. He feels that it is a misunderstanding on the part of his PCP.  According to patient's wife, patient has been having at least one year or longer history of short-term memory problems, word finding difficulty, difficulty keeping appointments, decreased activity at home. Patient has stopped taking care of household chores and yard work for at least 2 years. Patient states that he still is able to do this. Patient still drives his car although his wife is worried about it. A few months ago patient had an appointment with PCP at 2 PM. However patient woke up around midnight, got in the car and started driving around [place]. He ended up at a [employer] and then [employer] restaurant, and workers there called patient's wife because they felt that he needed some help. However patient didn't realize that husband had actually left the house, and therefore hung up. Patient returned home around 5 PM the next day.  Patient had one episode of word finding difficulty, dizziness and confusion several years ago which patient calls a "heat stroke". Patient did not seek medical attention for this.    REVIEW OF SYSTEMS: Full 14  system review of systems performed and negative with exception of: Only as per history of present illness.  ALLERGIES: No Known Allergies  HOME MEDICATIONS: Outpatient Prescriptions Prior to Visit  Medication Sig Dispense Refill  . aspirin 81 MG tablet Take 81 mg by mouth at bedtime.     . carvedilol (COREG) 3.125 MG tablet TAKE 1 TABLET (3.125 MG TOTAL) BY MOUTH 2 (TWO) TIMES DAILY WITH A MEAL. 180 tablet 3  . ferrous sulfate 325 (65 FE) MG tablet Take 1 tablet (325 mg total) by mouth 3 (three) times daily with meals. 90 tablet 3  . furosemide (LASIX) 40 MG tablet Take 1 tablet (40 mg total) by mouth 2 (two) times daily. 60 tablet 11  . glimepiride (AMARYL) 2 MG tablet TAKE 1 TABLET BY MOUTH EVERY DAY WITH BREAKFAST TO CONTROL DIABETES 90 tablet 1  . metFORMIN (GLUCOPHAGE) 500 MG tablet TAKE 2 TABLETS BY MOUTH TWICE A DAY WITH MEALS 360 tablet 1  . Multiple Vitamin (MULTIVITAMIN) tablet Take 1 tablet by mouth daily.     Marland Kitchen omeprazole (PRILOSEC) 40 MG capsule TAKE ONE CAPSULE BY MOUTH TWICE A DAY FOR ACID REDUCTION 180 capsule 2  . sacubitril-valsartan (ENTRESTO) 24-26 MG One twice daily to strengthen the heart 180 tablet 4  . silver sulfADIAZINE (SILVADENE) 1 % cream Cleanse wound daily then apply cream to the wound and cover with gauze 50 g 3  . simvastatin (ZOCOR) 40 MG tablet TAKE ONE TABLET BY MOUTH ONCE DAILY FOR CHOLESTEROL 90 tablet 2  No facility-administered medications prior to visit.    PAST MEDICAL HISTORY: Past Medical History  Diagnosis Date  . Hyperlipidemia   . Hypertensive heart disease   . Unspecified disorder of kidney and ureter   . Diastasis of muscle   . Elevated prostate specific antigen (PSA)   . GERD (gastroesophageal reflux disease)   . CAD (coronary artery disease)   . Aortic valve disease   . Type II diabetes mellitus ([place])   . CHF (congestive heart failure) ([place])   . Chronic kidney disease (CKD), stage III (moderate)   . Sun stroke ~ 2014     "haven't had any strength since"  . Tuberculosis     "when I was a child they said I'd had a spot on my lungs & that it was TB but it was ok then"  . Pneumonia     "small case"  . CAP (community acquired pneumonia) 09/2013    Archie Endo 10/03/2013    PAST SURGICAL HISTORY: Past Surgical History  Procedure Laterality Date  . Finger surgery Right 1955    INDEX AND RIGHT MIDDLE FINGER  . Sp intro uret cath perc  03-1999  . Left and right heart catheterization with coronary angiogram N/A 05/24/2014    Procedure: LEFT AND RIGHT HEART CATHETERIZATION WITH CORONARY ANGIOGRAM;  Surgeon: Jacolyn Reedy, MD;  Location: [facility] CATH LAB;  Service: Cardiovascular;  Laterality: N/A;  . Coronary angioplasty with stent placement  05/2000  . Cardiac catheterization  05/2014  . Transurethral resection of prostate  10/2000    /notes 08/16/2010    FAMILY HISTORY: Family History  Problem Relation Age of Onset  . Heart disease Mother   . Diabetes Mother   . Heart disease Father   . Heart attack Father   . Heart disease Sister   . Diabetes Sister   . Heart disease Brother   . Diabetes Brother   . Diabetes Brother   . Heart disease Brother   . Diabetes Brother   . Heart disease Brother     SOCIAL HISTORY:  Social History   Social History  . Marital Status: Married    Spouse Name: [place]  . Number of Children: 2  . Years of Education: 16   Occupational History  .      retired Surveyor, mining   Social History Main Topics  . Smoking status: Former Smoker -- 0.50 packs/day for 1 years    Types: Cigarettes    Quit date: 04/02/1968  . Smokeless tobacco: Former Systems developer    Types: Chew     Comment: "chewed very little'  . Alcohol Use: 0.0 oz/week    0 Standard drinks or equivalent per week     Comment: 03/07/2015 "maybe once/year"  . Drug Use: No  . Sexual Activity: Not on file   Other Topics Concern  . Not on file   Social History Narrative   Married, live [place], lives w/ spouse, has a  living well, retired Surveyor, mining. Does not use caffeine, exerise walk. Has dog.     PHYSICAL EXAM  GENERAL EXAM/CONSTITUTIONAL: Vitals:  Filed Vitals:   07/04/15 1044  BP: 129/76  Pulse: 98  Height: 5\' 6"  (1.676 m)  Weight: 191 lb (86.637 kg)     Body mass index is 30.84 kg/(m^2).  Vision Screening Comments: 07/04/15 unable, wears bifocals  Patient is in no distress; well developed, nourished and groomed; neck is supple  CARDIOVASCULAR:  Examination of carotid arteries is normal;  no carotid bruits  Regular rate and rhythm, no murmurs  Examination of peripheral vascular system by observation and palpation is normal  EYES:  Ophthalmoscopic exam of optic discs and posterior segments is normal; no papilledema or hemorrhages  MUSCULOSKELETAL:  Gait, strength, tone, movements noted in Neurologic exam below  NEUROLOGIC: MENTAL STATUS:  MMSE - Mini Mental State Exam 07/04/2015 06/30/2014  Orientation to time 1 5  Orientation to Place 4 5  Registration 3 3  Attention/ Calculation 4 5  Recall 3 3  Language- name 2 objects 2 2  Language- repeat 1 1  Language- follow 3 step command 2 3  Language- follow 3 step command-comments did not fold in half -  Language- read & follow direction 1 1  Write a sentence 1 1  Copy design 1 1  Total score 23 30    awake, alert, oriented to person; NOT PLACE OR TIME  DECR recent and remote memory intact  DECR attention and concentration  language fluent, comprehension intact, naming intact,   fund of knowledge appropriate  CRANIAL NERVE:   2nd - no papilledema on fundoscopic exam  2nd, 3rd, 4th, 6th - RIGHT PUPIL NR WITH CATARACT; LEFT PUPIL REACTIVE; BLIND IN RIGHT EYE; LEFT EYE visual fields full to confrontation, extraocular muscles --> RIGHT EYE SLIGHTLY TURNED INWARD; OTHERWISE EOMI; no nystagmus  5th - facial sensation symmetric  7th - facial strength symmetric  8th - hearing DECR   9th - palate elevates  symmetrically, uvula midline  11th - shoulder shrug symmetric  12th - tongue protrusion midline  HOARSE VOICE  MOTOR:   normal bulk and tone, DIFFUSE DECR STRENGTH (4) strength in the BUE, BLE  SENSORY:   normal and symmetric to light touch, temperature, vibration  COORDINATION:   finger-nose-finger, fine finger movements SLOW  REFLEXES:   deep tendon reflexes TRACE and symmetric  POSITIVE SNOUT, MYERSON AND PALMOMENTAL REFLEXES  GAIT/STATION:   narrow based gait; SLOW, UNSTEADY GAIT; STOOPED POSTURE; SLOW TO RISE    DIAGNOSTIC DATA (LABS, IMAGING, TESTING) - I reviewed patient records, labs, notes, testing and imaging myself where available.  Lab Results  Component Value Date   WBC 8.2 04/18/2015   HGB 9.2* 03/07/2015   HCT 36.6* 04/18/2015   MCV 76* 04/18/2015   PLT 215 03/07/2015      Component Value Date/Time   NA 146* 04/18/2015 0946   NA 136 03/12/2015 0229   K 3.9 04/18/2015 0946   CL 101 04/18/2015 0946   CO2 26 04/18/2015 0946   GLUCOSE 85 04/18/2015 0946   GLUCOSE 120* 03/12/2015 0229   BUN 33* 04/18/2015 0946   BUN 28* 03/12/2015 0229   CREATININE 1.25 04/18/2015 0946   CALCIUM 9.6 04/18/2015 0946   PROT 6.3* 03/07/2015 2030   PROT 6.9 09/30/2014 1119   ALBUMIN 3.3* 03/07/2015 2030   ALBUMIN 4.4 09/30/2014 1119   AST 42* 03/07/2015 2030   ALT 39 03/07/2015 2030   ALKPHOS 92 03/07/2015 2030   BILITOT 0.6 03/07/2015 2030   BILITOT 0.7 09/30/2014 1119   GFRNONAA 53* 04/18/2015 0946   GFRAA 61 04/18/2015 0946   Lab Results  Component Value Date   CHOL 110 02/28/2015   HDL 38* 02/28/2015   LDLCALC 55 02/28/2015   TRIG 83 02/28/2015   CHOLHDL 2.9 02/28/2015   Lab Results  Component Value Date   HGBA1C 6.7* 03/07/2015   Lab Results  Component Value Date   VITAMINB12 493 03/08/2015   Lab Results  Component Value Date   TSH 2.092 03/07/2015   03/08/15 EKG [I reviewed images myself and agree with interpretation. -VRP]  - normal  sinus rhythm  10/09/12 CT head [I reviewed images myself and agree with interpretation. -VRP]  - No evidence of acute intracranial abnormality. - Atrophy, chronic small vessel white matter ischemic changes and remote left basil ganglia infarcts  10/09/12 CT cervical spine [I reviewed images myself and agree with interpretation. -VRP]  - No static evidence of acute injury to the cervical spine. - Degenerative changes as described.    ASSESSMENT AND PLAN  82 y.o. year old male here with progressive short-term memory problem, confusion, decline in activity level, anger outbursts and mood change. MMSE 23 out of 30. Patient also has frontal release signs. Most like represents neurodegenerative dementia. CT scan of the head from 2014 also shows atrophy, chronic small vessel disease, left brain subcortical ischemic infarctions. Patient has significant denial of deficits.   Dx: mild dementia (alzheimer's vs vascular dementia)  1. Mild dementia   2. Memory loss   3. Chronic ischemic left MCA stroke      PLAN: - MRI brain - safety and supervision issues reviewed - advised that patient should not drive (due to memory loss, physical mobility limitations, poor judgement, poor insight, anger outbursts)  Orders Placed This Encounter  Procedures  . MR Brain Wo Contrast   Return in about 3 months (around 10/03/2015).  I reviewed images, labs, notes, records myself. I summarized findings and reviewed with patient, for this high risk condition (memory loss, dementia) requiring high complexity decision making.    Penni Bombard, MD [id number], 0000000 AM Certified in Neurology, Neurophysiology and Neuroimaging  [facility] [address] [place], [place] [address] [phone]

## 2015-07-04 NOTE — Patient Instructions (Signed)
Thank you for coming to see [place] at [facility]. I hope we have been able to provide you high quality care today.  You may receive a patient satisfaction survey over the next few weeks. We would appreciate your feedback and comments so that we may continue to improve ourselves and the health of our patients. - MRI brain - safety and supervision issues reviewed - advised that patient should not drive (due to memory loss, physical mobility limitations, poor judgement, poor insight, anger outbursts)  ~~~~~~~~~~~~~~~~~~~~~~~~~~~~~~~~~~~~~~~~~~~~~~~~~~~~~~~~~~~~~~~~~  DR. Elsy Chiang'S GUIDE TO HAPPY AND HEALTHY LIVING These are some of my general health and wellness recommendations. Some of them may apply to you better than others. Please use common sense as you try these suggestions and feel free to ask me any questions.   ACTIVITY/FITNESS Mental, social, emotional and physical stimulation are very important for brain and body health. Try learning a new activity (arts, music, language, sports, games).  Keep moving your body to the best of your abilities. You can do this at home, inside or outside, the park, community center, gym or anywhere you like. Consider a physical therapist or personal trainer to get started. Consider the app Sworkit. Fitness trackers such as smart-watches, smart-phones or Fitbits can help as well.   NUTRITION Eat more plants: colorful vegetables, nuts, seeds and berries.  Eat less sugar, salt, preservatives and processed foods.  Avoid toxins such as cigarettes and alcohol.  Drink water when you are thirsty. Warm water with a slice of lemon is an excellent morning drink to start the day.  Consider these websites for more information The Nutrition Source (https://www.henry-hernandez.biz/) Precision Nutrition (WindowBlog.ch)   RELAXATION Consider practicing mindfulness meditation or other relaxation  techniques such as deep breathing, prayer, yoga, tai chi, massage. See website mindful.org or the apps Headspace or Calm to help get started.   SLEEP Try to get at least 7-8+ hours sleep per day. Regular exercise and reduced caffeine will help you sleep better. Practice good sleep hygeine techniques. See website sleep.org for more information.   PLANNING Prepare estate planning, living will, healthcare POA documents. Sometimes this is best planned with the help of an attorney. Theconversationproject.org and agingwithdignity.org are excellent resources.

## 2015-07-09 DIAGNOSIS — F0390 Unspecified dementia without behavioral disturbance: Secondary | ICD-10-CM | POA: Diagnosis not present

## 2015-07-09 DIAGNOSIS — R413 Other amnesia: Secondary | ICD-10-CM | POA: Diagnosis not present

## 2015-07-09 DIAGNOSIS — Z8673 Personal history of transient ischemic attack (TIA), and cerebral infarction without residual deficits: Secondary | ICD-10-CM | POA: Diagnosis not present

## 2015-07-10 ENCOUNTER — Ambulatory Visit
Admission: RE | Admit: 2015-07-10 | Discharge: 2015-07-10 | Disposition: A | Discharge status: Home / Self Care | Payer: Medicare Other | Source: Ambulatory Visit | Attending: Diagnostic Neuroimaging | Admitting: Diagnostic Neuroimaging

## 2015-07-10 DIAGNOSIS — R413 Other amnesia: Secondary | ICD-10-CM

## 2015-07-10 DIAGNOSIS — F03A0 Unspecified dementia, mild, without behavioral disturbance, psychotic disturbance, mood disturbance, and anxiety: Secondary | ICD-10-CM

## 2015-07-10 DIAGNOSIS — I6930 Unspecified sequelae of cerebral infarction: Secondary | ICD-10-CM

## 2015-07-10 DIAGNOSIS — F0390 Unspecified dementia without behavioral disturbance: Secondary | ICD-10-CM

## 2015-07-12 ENCOUNTER — Telehealth: Payer: Self-pay | Admitting: *Deleted

## 2015-07-12 NOTE — Telephone Encounter (Signed)
I spoke to patient's wife with 74-month phone follow-up call for REDS@Discharge  Study. Patient has not been re-hospitalized for congestive heart failure. I thanked wife for their participation in the study.

## 2015-07-18 ENCOUNTER — Other Ambulatory Visit: Payer: Medicare Other

## 2015-07-18 ENCOUNTER — Telehealth: Payer: Self-pay | Admitting: Diagnostic Neuroimaging

## 2015-07-18 DIAGNOSIS — F03A0 Unspecified dementia, mild, without behavioral disturbance, psychotic disturbance, mood disturbance, and anxiety: Secondary | ICD-10-CM

## 2015-07-18 DIAGNOSIS — F0390 Unspecified dementia without behavioral disturbance: Secondary | ICD-10-CM

## 2015-07-18 DIAGNOSIS — R413 Other amnesia: Secondary | ICD-10-CM

## 2015-07-18 NOTE — Telephone Encounter (Signed)
Old stroke and atrophy. No acute findings. Follow up in 3 months. -VRP

## 2015-07-18 NOTE — Telephone Encounter (Signed)
LVM requesting call back for results. 

## 2015-07-18 NOTE — Telephone Encounter (Signed)
Spouse called to request results from 07/10/15 MRI, states they haven't heard from anyone regarding results.

## 2015-07-19 NOTE — Telephone Encounter (Signed)
Spoke with wife and informed her, per Dr Leta Baptist, patient's MRI showed his old stroke and atrophy, but no acute findings. He will discuss in patient's next FU in 3 months.   She then expressed concern re: he is still driving. She stated she always offers but often he drives. She stated he drives too fast, and she is fearful of riding with him. Advised she could take away the keys; she stated "I am afraid of him. He would kill me. He has outbursts. "  She stated she is unsure if he gets lost, but he is gone sometimes 3-4 hours. They have 2 sons wjich live in another city, are self-employed, so very busy and unavailable to help her. Inquired if she has looked into packet of Alzheimer's information given at patient's last OV. She does not have packet, will ask her husband about it. She'll call for one to be mailed to her if it cannot be found. Advised there is a lot of information re: organizations that can offer different types of help. She verbalized understanding, appreciation.

## 2015-07-19 NOTE — Telephone Encounter (Signed)
Spoke with wife, Karena Addison and informed her that SW referral has been placed. Advised she call if she has not heard back in 5-7 days on referral. She verbalized understanding, appreciation.

## 2015-07-19 NOTE — Telephone Encounter (Signed)
pls setup social work consult first. -VRP

## 2015-07-20 ENCOUNTER — Other Ambulatory Visit: Payer: Federal, State, Local not specified - PPO

## 2015-07-20 ENCOUNTER — Ambulatory Visit (INDEPENDENT_AMBULATORY_CARE_PROVIDER_SITE_OTHER): Payer: Federal, State, Local not specified - PPO | Admitting: Internal Medicine

## 2015-07-20 ENCOUNTER — Telehealth: Payer: Self-pay | Admitting: Diagnostic Neuroimaging

## 2015-07-20 ENCOUNTER — Encounter: Payer: Self-pay | Admitting: Internal Medicine

## 2015-07-20 ENCOUNTER — Other Ambulatory Visit: Payer: Self-pay | Admitting: *Deleted

## 2015-07-20 VITALS — BP 130/86 | HR 90 | Temp 97.6°F | Ht 66.0 in | Wt 188.0 lb

## 2015-07-20 DIAGNOSIS — R413 Other amnesia: Secondary | ICD-10-CM

## 2015-07-20 DIAGNOSIS — IMO0002 Reserved for concepts with insufficient information to code with codable children: Secondary | ICD-10-CM

## 2015-07-20 DIAGNOSIS — N182 Chronic kidney disease, stage 2 (mild): Principal | ICD-10-CM

## 2015-07-20 DIAGNOSIS — I5022 Chronic systolic (congestive) heart failure: Secondary | ICD-10-CM

## 2015-07-20 DIAGNOSIS — N183 Chronic kidney disease, stage 3 unspecified: Secondary | ICD-10-CM

## 2015-07-20 DIAGNOSIS — H9190 Unspecified hearing loss, unspecified ear: Secondary | ICD-10-CM | POA: Insufficient documentation

## 2015-07-20 DIAGNOSIS — E1165 Type 2 diabetes mellitus with hyperglycemia: Secondary | ICD-10-CM | POA: Diagnosis not present

## 2015-07-20 DIAGNOSIS — E1122 Type 2 diabetes mellitus with diabetic chronic kidney disease: Secondary | ICD-10-CM | POA: Diagnosis not present

## 2015-07-20 DIAGNOSIS — I679 Cerebrovascular disease, unspecified: Secondary | ICD-10-CM | POA: Diagnosis not present

## 2015-07-20 DIAGNOSIS — E785 Hyperlipidemia, unspecified: Secondary | ICD-10-CM | POA: Diagnosis not present

## 2015-07-20 DIAGNOSIS — F0390 Unspecified dementia without behavioral disturbance: Secondary | ICD-10-CM

## 2015-07-20 DIAGNOSIS — F03A0 Unspecified dementia, mild, without behavioral disturbance, psychotic disturbance, mood disturbance, and anxiety: Secondary | ICD-10-CM

## 2015-07-20 DIAGNOSIS — I5042 Chronic combined systolic (congestive) and diastolic (congestive) heart failure: Secondary | ICD-10-CM

## 2015-07-20 NOTE — Telephone Encounter (Signed)
Thanks Dian Situ  With [employer] has been contacted.

## 2015-07-20 NOTE — Telephone Encounter (Signed)
Drew from [employer] called and asked if you could add Nursing/ social work to referral and they can go out and help patient. Verneita Griffes will you let me know when done so I contact Dian Situ. Thanks Hinton Dyer.

## 2015-07-20 NOTE — Progress Notes (Signed)
Patient ID: Jimmy Dawson, male   DOB: [birth year], 82 y.o.   MRN: [id number]    Facility  [facility]    Place of Service:   OFFICE    No Known Allergies  Chief Complaint  Patient presents with  . Medical Management of Chronic Issues    3 month OV, blood sugar, CHF, CKD with wife    HPI:  Saw neuro, Dr. Leta Baptist.  Most likely has a neurodegenerative dementia. Recommended no driving. MRI brain 07/10/15: remote left basal ganglia infarct, cortical atrophy, microvascular disease. He also documented slow walking and decreased memory on testing. Patient is resistant to the dea that he has a memory problem.  Saw cardiology, Dr. Wynonia Lawman. CHF stable.  Medications: Patient's Medications  New Prescriptions   No medications on file  Previous Medications   ASPIRIN 81 MG TABLET    Take 81 mg by mouth at bedtime.    CARVEDILOL (COREG) 3.125 MG TABLET    TAKE 1 TABLET (3.125 MG TOTAL) BY MOUTH 2 (TWO) TIMES DAILY WITH A MEAL.   FERROUS SULFATE 325 (65 FE) MG TABLET    Take 1 tablet (325 mg total) by mouth 3 (three) times daily with meals.   FUROSEMIDE (LASIX) 40 MG TABLET    Take 1 tablet (40 mg total) by mouth 2 (two) times daily.   GLIMEPIRIDE (AMARYL) 2 MG TABLET    TAKE 1 TABLET BY MOUTH EVERY DAY WITH BREAKFAST TO CONTROL DIABETES   IVABRADINE (CORLANOR) 5 MG TABS TABLET    Take 5 mg by mouth. Take one tablet twice daily (samples given 05/05/15 by Dr. Wynonia Lawman)   METFORMIN (GLUCOPHAGE) 500 MG TABLET    TAKE 2 TABLETS BY MOUTH TWICE A DAY WITH MEALS   MULTIPLE VITAMIN (MULTIVITAMIN) TABLET    Take 1 tablet by mouth daily.    OMEPRAZOLE (PRILOSEC) 40 MG CAPSULE    TAKE ONE CAPSULE BY MOUTH TWICE A DAY FOR ACID REDUCTION   SACUBITRIL-VALSARTAN (ENTRESTO) 24-26 MG    One twice daily to strengthen the heart   SILVER SULFADIAZINE (SILVADENE) 1 % CREAM    Cleanse wound daily then apply cream to the wound and cover with gauze   SIMVASTATIN (ZOCOR) 40 MG TABLET    TAKE ONE TABLET BY MOUTH ONCE DAILY FOR  CHOLESTEROL  Modified Medications   No medications on file  Discontinued Medications   No medications on file    Review of Systems  Constitutional: Negative.  Negative for fever, chills, diaphoresis, activity change, appetite change and fatigue.  HENT: Positive for hearing loss. Negative for congestion, ear pain, nosebleeds, rhinorrhea, sinus pressure and tinnitus.        Wears prescription lenses. Otherwise negative in regards to symptoms. Wax in both EAC and impacted in right ear.  Eyes:       Wears prescription lenses. Negative for all other symptoms.  Respiratory: Positive for shortness of breath. Negative for cough, choking, chest tightness and wheezing.   Cardiovascular: Positive for leg swelling. Negative for chest pain and palpitations.       History of combined systolic and diastolic congestive heart failure.  Gastrointestinal: Negative for abdominal pain, diarrhea, constipation and blood in stool.  Endocrine: Negative for cold intolerance, heat intolerance, polydipsia, polyphagia and polyuria.  Genitourinary: Negative for dysuria, urgency, frequency, flank pain, decreased urine volume, enuresis and difficulty urinating.  Musculoskeletal: Negative for myalgias, back pain, arthralgias and gait problem.  Skin:       Healed 1.5 x 4" wound of  the right leg. Has raw area at sacrum.  Allergic/Immunologic: Negative.   Neurological: Negative for dizziness, tremors, speech difficulty, weakness, light-headedness, numbness and headaches.       Patient says he is aware that his memory is slipping. 06/30/2014 MMSE 30/30. Passed clock drawing.  Hematological:       Hx of anemia  Psychiatric/Behavioral: Positive for confusion.    Filed Vitals:   07/20/15 1228  BP: 130/86  Pulse: 90  Temp: 97.6 F (36.4 C)  TempSrc: Oral  Height: '5\' 6"'$  (1.676 m)  Weight: 188 lb (85.276 kg)  SpO2: 99%   Body mass index is 30.36 kg/(m^2). Filed Weights   07/20/15 1228  Weight: 188 lb (85.276 kg)       Physical Exam  Constitutional: He is oriented to person, place, and time. He appears well-developed and well-nourished.  HENT:  Significant hearing loss bilaterally. Interferes with conversation.  Eyes:  Wears prescription lenses, otherwise normal.  Neck: Neck supple. No JVD present. No tracheal deviation present. No thyromegaly present.  Cardiovascular: Normal rate, regular rhythm, normal heart sounds and intact distal pulses.  Exam reveals no gallop.   No murmur heard. Pulmonary/Chest: No respiratory distress. He has no wheezes. He has rales (Moist rales in bibasilar lung areas). He exhibits no tenderness.  Diminished sounds in the right lung.  Abdominal: Soft. Bowel sounds are normal. He exhibits no mass. There is no tenderness.  Genitourinary:  Prior penile edema has resolved.  Musculoskeletal: He exhibits edema (1-2+ bipedal).  Chronic back discomfort with movement.  Lymphadenopathy:    He has no cervical adenopathy.  Neurological: He is alert and oriented to person, place, and time. No cranial nerve deficit. Coordination normal.  Wife has reported issues with his memory in the past. He now says that he is aware of some memory slippage. He does not feel that it incapacitates him in any way.  Skin: Skin is warm and dry. No rash noted. No erythema.  Raw area at the sacrum.  Psychiatric: He has a normal mood and affect. His behavior is normal. Judgment and thought content normal.    Labs reviewed: Lab Summary Latest Ref Rng 04/18/2015 03/12/2015 03/11/2015 03/10/2015 03/09/2015 03/08/2015  Hemoglobin 12.6 - 17.7 g/dL 11.7(L) (None) (None) (None) (None) (None)  Hematocrit 37.5 - 51.0 % 36.6(L) (None) (None) (None) (None) (None)  White count 3.4 - 10.8 x10E3/uL 8.2 (None) (None) (None) (None) (None)  Platelet count - (None) (None) (None) (None) (None) (None)  Sodium 134 - 144 mmol/L 146(H) 136 138 137 137 144  Potassium 3.5 - 5.2 mmol/L 3.9 4.1 3.8 3.8 3.9 3.1(L)  Calcium 8.6 -  10.2 mg/dL 9.6 8.6(L) 8.8(L) 8.7(L) 8.8(L) 8.9  Phosphorus - (None) (None) (None) (None) (None) (None)  Creatinine 0.76 - 1.27 mg/dL 1.25 1.45(H) 1.52(H) 1.31(H) 1.56(H) 1.57(H)  AST - (None) (None) (None) (None) (None) (None)  Alk Phos - (None) (None) (None) (None) (None) (None)  Bilirubin - (None) (None) (None) (None) (None) (None)  Glucose 65 - 99 mg/dL 85 120(H) 118(H) 128(H) 134(H) 62(L)  Cholesterol - (None) (None) (None) (None) (None) (None)  HDL cholesterol - (None) (None) (None) (None) (None) (None)  Triglycerides - (None) (None) (None) (None) (None) (None)  LDL Direct - (None) (None) (None) (None) (None) (None)  LDL Calc - (None) (None) (None) (None) (None) (None)  Total protein - (None) (None) (None) (None) (None) (None)  Albumin - (None) (None) (None) (None) (None) (None)   Lab Results  Component Value Date   TSH  2.092 03/07/2015   TSH 1.454 *Test methodology is 3rd generation TSH* 06/27/2007   Lab Results  Component Value Date   BUN 33* 04/18/2015   BUN 28* 03/12/2015   BUN 26* 03/11/2015   Lab Results  Component Value Date   HGBA1C 6.7* 03/07/2015   HGBA1C 6.6* 02/28/2015   HGBA1C 6.1* 09/30/2014    Assessment/Plan 1. Mild memory disturbance Most of today's visit was spent discussing his memory problems. Patient is not accepting the idea that he has a significant memory issue. He does not accept the idea that striving should be restricted. We discussed him going to the driver's license agency for the state versus an independent assessment of his striving. He elects to go to the state driver's license for further testing. Approximately 45 minutes was spent discussing this issue with the patient and his wife.  2. Uncontrolled type 2 diabetes mellitus with stage 2 chronic kidney disease, without long-term current use of insulin ([place]) Patient films show up for lab will be drawn next visit - Hemoglobin A1c; Future - Comprehensive metabolic panel; Future  3.  Cerebrovascular disease May contribute to his memory problems and to his altered gait. Other than aspirin daily, do not feel anything more needs to be done about this issue.  4. Stage III chronic kidney disease Follow-up lab ordered - Comprehensive metabolic panel; Future  5. Hyperlipidemia Follow-up lab ordered - Lipid panel; Future  6. Chronic systolic heart failure Patient is to remain on all current medications including Entresto.

## 2015-07-21 ENCOUNTER — Telehealth: Payer: Self-pay

## 2015-07-21 DIAGNOSIS — I5042 Chronic combined systolic (congestive) and diastolic (congestive) heart failure: Secondary | ICD-10-CM

## 2015-07-21 LAB — BASIC METABOLIC PANEL
BUN / CREAT RATIO: 17 (ref 10–24)
BUN: 21 mg/dL (ref 8–27)
CO2: 27 mmol/L (ref 18–29)
CREATININE: 1.26 mg/dL (ref 0.76–1.27)
Calcium: 9.3 mg/dL (ref 8.6–10.2)
Chloride: 98 mmol/L (ref 96–106)
GFR calc non Af Amer: 52 mL/min/{1.73_m2} — ABNORMAL LOW (ref >59)
GFR, EST AFRICAN AMERICAN: 60 mL/min/{1.73_m2} (ref >59)
Glucose: 128 mg/dL — ABNORMAL HIGH (ref 65–99)
Potassium: 4.2 mmol/L (ref 3.5–5.2)
Sodium: 142 mmol/L (ref 134–144)

## 2015-07-21 LAB — MICROALBUMIN, URINE: Microalbumin, Urine: 731 ug/mL

## 2015-07-21 LAB — BRAIN NATRIURETIC PEPTIDE: BNP: 4007.6 pg/mL — ABNORMAL HIGH (ref 0.0–100.0)

## 2015-07-21 LAB — HEMOGLOBIN A1C
Est. average glucose Bld gHb Est-mCnc: 140 mg/dL
Hgb A1c MFr Bld: 6.5 % — ABNORMAL HIGH (ref 4.8–5.6)

## 2015-07-21 MED ORDER — SACUBITRIL-VALSARTAN 24-26 MG PO TABS: ORAL_TABLET | ORAL | Status: DC

## 2015-07-21 NOTE — Telephone Encounter (Signed)
Spoke with wife about lab results. Dr. Nyoka Cowden was to call in Rx for [facility] yesterday. Told her that 180 tablets with 4 refills on 06/15/15 to [place], it was to go to [facility]. Fax to [place]

## 2015-07-29 ENCOUNTER — Encounter: Payer: Self-pay | Admitting: Cardiology

## 2015-07-29 NOTE — Progress Notes (Signed)
Patient ID: Jimmy Dawson, male   DOB: September 16, 1928, 82 y.o.   MRN: [id number]   [place], [place]    Date of visit:  07/29/2015 DOB:  October 15, 1928    Age:  82 yrs. Medical record number:  [id number]     Account number:  [id number] Primary Care Provider: GREEN III, ARTHUR G ____________________________ CURRENT DIAGNOSES  1. Dyspnea  2. Chronic systolic heart failure  3. Hypertensive heart disease without heart failure  4. CAD Native without angina  5. Aortic valve disorder, unspecified  6. Left bundle-branch block  7. Hyperlipidemia  8. Chronic kidney disease, stage 3 (moderate)  9. Type 2 diabetes mellitus without complications  10. Presence of coronary angioplasty implant and graft  11. Gastro-esophageal reflux disease without esophagitis ____________________________ ALLERGIES  No Known Allergies ____________________________ MEDICATIONS  1. omeprazole 40 mg Capsule, Delayed Release(E.C.), 1 p.o. daily  2. multivitamin Tablet, 1 p.o. daily  3. simvastatin 40 mg tablet, 1 p.o. daily  4. Metamucil 3.4 gram/12 gram Powder, qd  5. aspirin 81 mg chewable tablet, 1 p.o. daily  6. metformin 500 mg tablet, 2 p.o. b.i.d.  7. glimepiride 2 mg tablet, 1 p.o. daily  8. carvedilol 3.125 mg tablet, BID  9. Colace 100 mg capsule, PRN  10. ibuprofen 200 mg tablet, PRN  11. spironolactone 25 mg tablet, 1 p.o. daily  12. ferrous sulfate 325 mg (65 mg iron) tablet, TID  13. Corlanor 5 mg tablet, BID  14. folic acid 1 mg tablet, BID  15. Entresto 49 mg-51 mg tablet, BID  16. furosemide 40 mg tablet, take one by mouth twice a day or more as directed ____________________________ CHIEF COMPLAINTS  Followup of Chronic systolic heart failure ____________________________ HISTORY OF PRESENT ILLNESS Patient seen early for cardiac followup. His wife called up here having been given abnormal lab results by his primary doctor and demanded that he be seen this week or she would find another doctor. He comes in today  and actually looks better than he did 2 weeks ago. He has lost about 5 pounds of weight and he denies any shortness of breath or angina. He hasn't been able to work in the yard was able to cut the grass yesterday on a riding mower for about one hour. He describes weakness in his abdomen if he stands but does not have any chest pain or shortness of breath. He denies claudication. He does have a known cardiomyopathy and also had catheterization that showed moderate left main and LAD disease with an EF around 20% in December. He doesn't bring his medicines in with him. His wife has difficulty going up stairs and no medicines were brought here today because he did not bring them down.  I think he is taking his medicines properly the best I can tell. She admits that he has difficulty with dementia and some memory loss and he does have very poor comprehension of his overall medical problems. ____________________________ PAST HISTORY  Past Medical Illnesses:  hypertension, DM-non-insulin dependent, hyperlipidemia, obesity, GERD, BPH, TIA;  Cardiovascular Illnesses:  CHF-left, chronic systolic heart failure, CAD;  Surgical Procedures:  TURP;  NYHA Classification:  II;  Canadian Angina Classification:  Class 0: Asymptomatic;  Cardiology Procedures-Invasive:  Penta stent February 2002 Dr. Glade Lloyd, cardiac cath (left) February 2016;  Cardiology Procedures-Noninvasive:  no previous non-invasive cardiovascular testing;  Cardiac Cath Results:  40% ostiall Left main, 40% proximal LAD, patent stent mid LAD, 95% stenosis proximal Diag 1, scattered irregularities CFX, scattered irregularities RCA;  LVEF  of 20% documented via echocardiogram on 03/07/2015,   ____________________________ CARDIO-PULMONARY TEST DATES EKG Date:  10/26/2014;   Cardiac Cath Date:  05/24/2014;  Stent Placement Date: 05/08/2000;  Nuclear Study Date:  05/06/2014;  Echocardiography Date: 03/07/2015;  Chest Xray Date: 03/07/2015;    ____________________________ FAMILY HISTORY Brother -- Brother dead, Myocardial infarction Brother -- Brother dead, Unknown disease Brother -- Brother dead, Unknown disease Father -- Father dead, Myocardial infarction Mother -- Mother dead, Diabetes mellitus, Coronary Artery Disease ____________________________ SOCIAL HISTORY Alcohol Use:  does not use alcohol;  Smoking:  nonsmoker;  Lifestyle:  widower and remarried 12 years;  Exercise:  no regular exercise;  Occupation:  [employer]-retired;  Residence:  lives with wife;   ____________________________ REVIEW OF SYSTEMS General:  weight loss of approximately 5 lbs Eyes: decreased acuity O.D. Respiratory: denies dyspnea, cough, wheezing or hemoptysis. Cardiovascular:  please review HPI Abdominal: denies dyspepsia, GI bleeding, constipation, or diarrhea Genitourinary-Male: frequency, nocturia, penile edema  Musculoskeletal:  chronic low back pain  ____________________________ PHYSICAL EXAMINATION VITAL SIGNS  Blood Pressure:  96/58 Sitting, Right arm, regular cuff  , 100/60 Standing, Right arm and regular cuff   Pulse:  90/min. Weight:  181.00 lbs. Height:  69"BMI: 27  Constitutional:  pleasant white male in no acute distress, mildly obese Skin:  warm and dry to touch, no apparent skin lesions, or masses noted. Head:  normocephalic, normal hair pattern, no masses or tenderness Eyes:  strabismus Neck:  no bruits, no masses, non-tender, JVD at 45 degrees 6cm Chest:  normal symmetry, clear to auscultation. Cardiac:  regular rhythm, normal S1 and S2, no murmur, grade 1/6 systolic murmur at aortic area radiating to neck Peripheral Pulses:  femoral pulses 2+, posterior tibial pulses 3+ Extremities & Back:  mild bilateral venous insufficiency changes present, 2+ edema Neurological:  no gross motor or sensory deficits noted, affect appropriate, oriented x3. ____________________________ MOST RECENT LIPID PANEL 02/28/15  CHOL TOTL 110 mg/dl,  LDL 55 NM, HDL 38 mg/dl and TRIGLYCER 83 mg/dl ____________________________ IMPRESSIONS/PLAN  1. Nonischemic cardiomyopathy 2. Mild to moderate aortic stenosis 3. Moderate coronary artery disease 4. Chronic kidney disease  Recommendations:  He has lost 5 pounds of weight and is not edematous today. My recommendations are for him to continue his current medicine. I have recommended that he try to maintain a weight of 182 pounds at home and that he take extra furosemide if his weight goes up. I recommended they bring his medicine but he comes. He likely will always have an elevated BNP level with his cardiac condition. He has had difficulty intermittently with confusion and getting him to really comprehend what is wrong with him has been quite an issue. Patient keep his regular appointment with me but I would always be glad to see him sooner. ____________________________ TODAYS ORDERS  1. Return Visit: keep appt                       ____________________________ Cardiology Physician:  Kerry Hough MD [facility]

## 2015-08-02 ENCOUNTER — Telehealth: Payer: Self-pay | Admitting: *Deleted

## 2015-08-02 NOTE — Telephone Encounter (Signed)
LVM requesting call back re: follow up to SW visit.

## 2015-08-03 NOTE — Telephone Encounter (Signed)
Pt's wife returned Verneita Griffes call. sts she will be driving and will not be able to answer the phone. Therefore she will call back

## 2015-08-09 ENCOUNTER — Telehealth: Payer: Self-pay | Admitting: *Deleted

## 2015-08-09 NOTE — Telephone Encounter (Signed)
Received call from Kathlee Nations, from [facility]. She stated she had tried to reach wife but got no answer. She requested additional information re: patient's situation.  She stated she would try to reach wife again to see what assistance [employer] may be able to provide. She verbalized appreciation for referral of patient.
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
## 2015-08-31 ENCOUNTER — Other Ambulatory Visit: Payer: Self-pay | Admitting: Internal Medicine

## 2015-09-04 ENCOUNTER — Other Ambulatory Visit: Payer: Self-pay | Admitting: Internal Medicine

## 2015-09-11 ENCOUNTER — Other Ambulatory Visit: Payer: Self-pay | Admitting: Physician Assistant

## 2015-09-14 ENCOUNTER — Other Ambulatory Visit: Payer: Self-pay | Admitting: Internal Medicine

## 2015-10-05 ENCOUNTER — Ambulatory Visit: Payer: Federal, State, Local not specified - PPO | Admitting: Diagnostic Neuroimaging

## 2015-10-17 ENCOUNTER — Other Ambulatory Visit: Payer: Federal, State, Local not specified - PPO

## 2015-10-17 DIAGNOSIS — IMO0002 Reserved for concepts with insufficient information to code with codable children: Secondary | ICD-10-CM

## 2015-10-17 DIAGNOSIS — E1165 Type 2 diabetes mellitus with hyperglycemia: Principal | ICD-10-CM

## 2015-10-17 DIAGNOSIS — E785 Hyperlipidemia, unspecified: Secondary | ICD-10-CM

## 2015-10-17 DIAGNOSIS — E1122 Type 2 diabetes mellitus with diabetic chronic kidney disease: Secondary | ICD-10-CM

## 2015-10-17 DIAGNOSIS — N182 Chronic kidney disease, stage 2 (mild): Principal | ICD-10-CM

## 2015-10-17 LAB — COMPLETE METABOLIC PANEL WITH GFR
ALBUMIN: 3.8 g/dL (ref 3.6–5.1)
ALK PHOS: 52 U/L (ref 40–115)
ALT: 15 U/L (ref 9–46)
AST: 19 U/L (ref 10–35)
BILIRUBIN TOTAL: 0.8 mg/dL (ref 0.2–1.2)
BUN: 31 mg/dL — AB (ref 7–25)
CO2: 30 mmol/L (ref 20–31)
Calcium: 9.2 mg/dL (ref 8.6–10.3)
Chloride: 104 mmol/L (ref 98–110)
Creat: 1.63 mg/dL — ABNORMAL HIGH (ref 0.70–1.11)
GFR, EST NON AFRICAN AMERICAN: 38 mL/min — AB (ref >=60)
GFR, Est African American: 44 mL/min — ABNORMAL LOW (ref >=60)
GLUCOSE: 113 mg/dL — AB (ref 65–99)
POTASSIUM: 3.9 mmol/L (ref 3.5–5.3)
SODIUM: 146 mmol/L (ref 135–146)
TOTAL PROTEIN: 6.1 g/dL (ref 6.1–8.1)

## 2015-10-17 LAB — LIPID PANEL
CHOL/HDL RATIO: 2.8 ratio (ref <=5.0)
CHOLESTEROL: 105 mg/dL — AB (ref 125–200)
HDL: 38 mg/dL — AB (ref >=40)
LDL Cholesterol: 52 mg/dL (ref <130)
Triglycerides: 77 mg/dL (ref <150)
VLDL: 15 mg/dL (ref <30)

## 2015-10-17 LAB — HEMOGLOBIN A1C
HEMOGLOBIN A1C: 6.8 % — AB (ref <5.7)
Mean Plasma Glucose: 148 mg/dL

## 2015-10-19 ENCOUNTER — Ambulatory Visit (INDEPENDENT_AMBULATORY_CARE_PROVIDER_SITE_OTHER): Payer: Federal, State, Local not specified - PPO | Admitting: Internal Medicine

## 2015-10-19 ENCOUNTER — Telehealth: Payer: Self-pay | Admitting: *Deleted

## 2015-10-19 ENCOUNTER — Encounter: Payer: Self-pay | Admitting: Internal Medicine

## 2015-10-19 VITALS — BP 128/80 | HR 109 | Temp 97.6°F | Resp 98 | Ht 66.0 in | Wt 201.0 lb

## 2015-10-19 DIAGNOSIS — R32 Unspecified urinary incontinence: Secondary | ICD-10-CM | POA: Diagnosis not present

## 2015-10-19 DIAGNOSIS — E785 Hyperlipidemia, unspecified: Secondary | ICD-10-CM

## 2015-10-19 DIAGNOSIS — R0600 Dyspnea, unspecified: Secondary | ICD-10-CM

## 2015-10-19 DIAGNOSIS — S81802A Unspecified open wound, left lower leg, initial encounter: Secondary | ICD-10-CM

## 2015-10-19 DIAGNOSIS — R413 Other amnesia: Secondary | ICD-10-CM

## 2015-10-19 DIAGNOSIS — I5042 Chronic combined systolic (congestive) and diastolic (congestive) heart failure: Secondary | ICD-10-CM

## 2015-10-19 DIAGNOSIS — N183 Chronic kidney disease, stage 3 unspecified: Secondary | ICD-10-CM

## 2015-10-19 DIAGNOSIS — E1122 Type 2 diabetes mellitus with diabetic chronic kidney disease: Secondary | ICD-10-CM | POA: Diagnosis not present

## 2015-10-19 DIAGNOSIS — S81812A Laceration without foreign body, left lower leg, initial encounter: Secondary | ICD-10-CM | POA: Insufficient documentation

## 2015-10-19 DIAGNOSIS — R609 Edema, unspecified: Secondary | ICD-10-CM | POA: Diagnosis not present

## 2015-10-19 DIAGNOSIS — N182 Chronic kidney disease, stage 2 (mild): Secondary | ICD-10-CM

## 2015-10-19 MED ORDER — TORSEMIDE 20 MG PO TABS: ORAL_TABLET | ORAL | Status: DC

## 2015-10-19 NOTE — Telephone Encounter (Signed)
Christy with [facility] called and stated that patient has brought in a Rx for Torsemide. She needs clarification on what the patient is suppose to be taking between the Torsemide, Furosemide and Spirolactone. In Dr. Rolly Salter note patient is to discontinue the Furosemide and start the Torsemide.

## 2015-10-19 NOTE — Patient Instructions (Signed)
Stop furosemide and start torsemide.

## 2015-10-19 NOTE — Progress Notes (Signed)
Patient ID: Jimmy Dawson, male   DOB: 1928-06-04, 82 y.o.   MRN: [id number]    Facility  [place]    Place of Service:   OFFICE    No Known Allergies  Chief Complaint  Patient presents with  . Medical Management of Chronic Issues    4 month follow-up, discuss labs (Copy printed)   . Sore    Open sore on right lower leg x 5 days. Area is draining and inflammed. Patient denies pain in area. Patient not sure if he injured area, patient thinks it is related to DM.   Marland Kitchen Shortness of Breath    SOB-Ongoing concern, getting worse  . Gait Problem    Patient with worsening gait concerns    HPI:  Ttype 2 diabetes mellitus with stage 2 chronic kidney disease, without long-term current use of insulin (HCC) - Controlled  Dyspnea - worse as compared to prior visit. He is retaining more fluid. He is gaining weight. He is more short of breath.  Hyperlipidemia - controlled  Mild memory disturbance - unchanged  Stage III chronic kidney disease - BUN and creatinine are slightly higher. I think this is driven by the increase in diuretics that he is taking to try to help with the edema and dyspnea.  Edema, unspecified type - worse as compared to previous visits  Urinary incontinence, unspecified incontinence type - unchanged  Skin tear of left lower leg without complication, initial encounter - linear skin tear across the right shin. Slight green drainage but no real infection. Wife says it was inflamed last night but looks better today.  Chronic combined systolic and diastolic congestive heart failure (HCC) - worsening despite all his current medications.    Medications: Patient's Medications  New Prescriptions   No medications on file  Previous Medications   ASPIRIN 81 MG TABLET    Take 81 mg by mouth at bedtime.    CARVEDILOL (COREG) 3.125 MG TABLET    TAKE 1 TABLET (3.125 MG TOTAL) BY MOUTH 2 (TWO) TIMES DAILY WITH A MEAL.   FERROUS SULFATE 325 (65 FE) MG TABLET    TAKE 1 TABLET BY MOUTH  3 TIMES A DAY WITH MEALS   FUROSEMIDE (LASIX) 40 MG TABLET    Take 1 tablet (40 mg total) by mouth 2 (two) times daily.   GLIMEPIRIDE (AMARYL) 2 MG TABLET    TAKE 1 TABLET BY MOUTH EVERY DAY WITH BREAKFAST TO CONTROL DIABETES   IVABRADINE (CORLANOR) 5 MG TABS TABLET    Take 5 mg by mouth. Take one tablet twice daily (samples given 05/05/15 by Dr. Wynonia Lawman)   METFORMIN (GLUCOPHAGE) 500 MG TABLET    TAKE 2 TABLETS BY MOUTH TWICE A DAY WITH MEALS   MULTIPLE VITAMIN (MULTIVITAMIN) TABLET    Take 1 tablet by mouth daily.    OMEPRAZOLE (PRILOSEC) 40 MG CAPSULE    TAKE ONE CAPSULE BY MOUTH TWICE A DAY FOR ACID REDUCTION   SACUBITRIL-VALSARTAN (ENTRESTO) 24-26 MG    One twice daily to strengthen the heart   SILVER SULFADIAZINE (SILVADENE) 1 % CREAM    Cleanse wound daily then apply cream to the wound and cover with gauze   SIMVASTATIN (ZOCOR) 40 MG TABLET    TAKE ONE TABLET BY MOUTH ONCE DAILY FOR CHOLESTEROL   SPIRONOLACTONE (ALDACTONE) 25 MG TABLET    TAKE 1 TABLET BY MOUTH EVERY MORNING TO REDUCE EDEMA AND TO STRENGTHEN THE HEART  Modified Medications   No medications on file  Discontinued  Medications   No medications on file    Review of Systems  Constitutional: Negative.  Negative for fever, chills, diaphoresis, activity change, appetite change and fatigue.  HENT: Positive for hearing loss. Negative for congestion, ear pain, nosebleeds, rhinorrhea, sinus pressure and tinnitus.        Wears prescription lenses. Otherwise negative in regards to symptoms. Wax in both EAC and impacted in right ear.  Eyes:       Wears prescription lenses. Negative for all other symptoms.  Respiratory: Positive for shortness of breath. Negative for cough, choking, chest tightness and wheezing.   Cardiovascular: Positive for leg swelling. Negative for chest pain and palpitations.       History of combined systolic and diastolic congestive heart failure.  Gastrointestinal: Negative for abdominal pain, diarrhea,  constipation and blood in stool.  Endocrine: Negative for cold intolerance, heat intolerance, polydipsia, polyphagia and polyuria.  Genitourinary: Negative for dysuria, urgency, frequency, flank pain, decreased urine volume, enuresis and difficulty urinating.  Musculoskeletal: Negative for myalgias, back pain, arthralgias and gait problem.  Skin:       Healed 1.5 x 4" wound of the right leg. Has raw area at sacrum.  Allergic/Immunologic: Negative.   Neurological: Negative for dizziness, tremors, speech difficulty, weakness, light-headedness, numbness and headaches.       Patient says he is aware that his memory is slipping. 06/30/2014 MMSE 30/30. Passed clock drawing.  Hematological:       Hx of anemia  Psychiatric/Behavioral: Positive for confusion.    Filed Vitals:   10/19/15 1158  BP: 128/80  Pulse: 109  Temp: 97.6 F (36.4 C)  TempSrc: Oral  Resp: 98  Height: '5\' 6"'$  (1.676 m)  Weight: 201 lb (91.173 kg)  SpO2: 91%   Body mass index is 32.46 kg/(m^2). Wt Readings from Last 3 Encounters:  10/19/15 201 lb (91.173 kg)  07/20/15 188 lb (85.276 kg)  07/04/15 191 lb (86.637 kg)      Physical Exam  Constitutional: He is oriented to person, place, and time. He appears well-developed and well-nourished.  HENT:  Significant hearing loss bilaterally. Interferes with conversation.  Eyes:  Wears prescription lenses, otherwise normal.  Neck: Neck supple. No JVD present. No tracheal deviation present. No thyromegaly present.  Cardiovascular: Normal rate, regular rhythm, normal heart sounds and intact distal pulses.  Exam reveals no gallop.   No murmur heard. Pulmonary/Chest: No respiratory distress. He has no wheezes. He has rales (Moist rales in bibasilar lung areas). He exhibits no tenderness.  Diminished sounds in the right lung.  Abdominal: Soft. Bowel sounds are normal. He exhibits no mass. There is no tenderness.  Genitourinary:  Prior penile edema has resolved.    Musculoskeletal: He exhibits edema (1-2+ bipedal).  Chronic back discomfort with movement.  Lymphadenopathy:    He has no cervical adenopathy.  Neurological: He is alert and oriented to person, place, and time. No cranial nerve deficit. Coordination normal.  Wife has reported issues with his memory in the past. He now says that he is aware of some memory slippage. He does not feel that it incapacitates him in any way.  Skin: Skin is warm and dry. No rash noted. No erythema.  Raw area at the sacrum.  Psychiatric: He has a normal mood and affect. His behavior is normal. Judgment and thought content normal.    Labs reviewed: Lab Summary Latest Ref Rng 10/17/2015 07/20/2015 04/18/2015 03/12/2015 03/11/2015 03/10/2015 03/09/2015  Hemoglobin 12.6 - 17.7 g/dL (None) (None) 11.7(L) (None) (None) (  None) (None)  Hematocrit 37.5 - 51.0 % (None) (None) 36.6(L) (None) (None) (None) (None)  White count 3.4 - 10.8 x10E3/uL (None) (None) 8.2 (None) (None) (None) (None)  Platelet count - (None) (None) (None) (None) (None) (None) (None)  Sodium 135 - 146 mmol/L 146 142 146(H) 136 138 137 137  Potassium 3.5 - 5.3 mmol/L 3.9 4.2 3.9 4.1 3.8 3.8 3.9  Calcium 8.6 - 10.3 mg/dL 9.2 9.3 9.6 8.6(L) 8.8(L) 8.7(L) 8.8(L)  Phosphorus - (None) (None) (None) (None) (None) (None) (None)  Creatinine 0.70 - 1.11 mg/dL 1.63(H) 1.26 1.25 1.45(H) 1.52(H) 1.31(H) 1.56(H)  AST 10 - 35 U/L 19 (None) (None) (None) (None) (None) (None)  Alk Phos 40 - 115 U/L 52 (None) (None) (None) (None) (None) (None)  Bilirubin 0.2 - 1.2 mg/dL 0.8 (None) (None) (None) (None) (None) (None)  Glucose 65 - 99 mg/dL 113(H) 128(H) 85 120(H) 118(H) 128(H) 134(H)  Cholesterol 125 - 200 mg/dL 105(L) (None) (None) (None) (None) (None) (None)  HDL cholesterol >=40 mg/dL 38(L) (None) (None) (None) (None) (None) (None)  Triglycerides <150 mg/dL 77 (None) (None) (None) (None) (None) (None)  LDL Direct - (None) (None) (None) (None) (None) (None) (None)  LDL  Calc <130 mg/dL 52 (None) (None) (None) (None) (None) (None)  Total protein 6.1 - 8.1 g/dL 6.1 (None) (None) (None) (None) (None) (None)  Albumin 3.6 - 5.1 g/dL 3.8 (None) (None) (None) (None) (None) (None)   Lab Results  Component Value Date   TSH 2.092 03/07/2015   TSH 1.454 *Test methodology is 3rd generation TSH* 06/27/2007   Lab Results  Component Value Date   BUN 31* 10/17/2015   BUN 21 07/20/2015   BUN 33* 04/18/2015   Lab Results  Component Value Date   HGBA1C 6.8* 10/17/2015   HGBA1C 6.5* 07/20/2015   HGBA1C 6.7* 03/07/2015    Assessment/Plan 1. Type 2 diabetes mellitus with stage 2 chronic kidney disease, without long-term current use of insulin (HCC) Continue current medications  2. Dyspnea Discontinued furosemide. Add torsemide 20 mg twice daily.  3. Hyperlipidemia Controlled  4. Mild memory disturbance  Unchanged   5. Stage III chronic kidney disease Higher BUN and creatinine since last checked  6. Edema, unspecified type - torsemide (DEMADEX) 20 MG tablet; Take one tablet twice daily to help improve edema  Dispense: 60 tablet; Refill: 5 - Basic metabolic panel; Future  7. Urinary incontinence, unspecified incontinence type Observe  8. Skin tear of left lower leg without complication, initial encounter Apply Neosporin ointment or cream daily and bandage  9. Chronic combined systolic and diastolic congestive heart failure (HCC) - torsemide (DEMADEX) 20 MG tablet; Take one tablet twice daily to help improve edema  Dispense: 60 tablet; Refill: 5

## 2015-11-08 ENCOUNTER — Telehealth: Payer: Self-pay | Admitting: Diagnostic Neuroimaging

## 2015-11-08 ENCOUNTER — Telehealth: Payer: Self-pay | Admitting: *Deleted

## 2015-11-08 NOTE — Telephone Encounter (Signed)
Received call from patient's wife who stated her husband "has gotten much worse. He was found on the side of the road Sunday and taken home." She stated his behaviors have gotten much worse, and she has called the Magistrate of [facility]. She stated he will be evaluated next week. She called this office to inform that the magistrate's office may call for information. Advised her that she will need to sign a release of information before this office can give patient's medical information. She verbalized understanding.

## 2015-11-08 NOTE — Telephone Encounter (Signed)
Wife Karena Addison called to speak with nurse Verneita Griffes, "husband has dementia, was found sitting by the side of the road on Sunday, neighbor found him and brought him home", skyped nurse Verneita Griffes, who took the call.

## 2015-11-08 NOTE — Telephone Encounter (Signed)
Karena Addison, wife called and stated that she would like for you to call her after 6:00 today (so her husband won't be around.)  Stated that she is going to have to have him evaluated for his dementia. Sunday the neighbors found him sitting beside the road when she wasn't home.  She stated that she is going to the Magistrate to have him picked up to be evaluated.  Please call [phone] after 6pm today.

## 2015-11-09 ENCOUNTER — Other Ambulatory Visit: Payer: Self-pay | Admitting: *Deleted

## 2015-11-09 MED ORDER — GLIMEPIRIDE 2 MG PO TABS: ORAL_TABLET | ORAL | 2 refills | Status: DC

## 2015-11-09 NOTE — Telephone Encounter (Signed)
[facility] 

## 2015-11-11 NOTE — Telephone Encounter (Signed)
Jimmy Dawson, wife called and requested to speak with Dr. Nyoka Cowden, said it was important to speak with him

## 2015-11-15 ENCOUNTER — Telehealth: Payer: Self-pay

## 2015-11-15 ENCOUNTER — Ambulatory Visit: Payer: Federal, State, Local not specified - PPO | Admitting: Internal Medicine

## 2015-11-15 NOTE — Telephone Encounter (Signed)
Called about missed appointment today at 3:15, spoke with wife. She didn't know about today's appt, there wasn't a message on the recorder. She is very concern about husband. Called left message 11/08/15 about being found on side of the road. He messed up the screen back door trying to get into the house. He won't let her know about his appt's, keeps his medication up stairs away from him. She's afraid of him. Dr. Wynonia Lawman can admit him to hospital for his edema but can't have Behavior Health to evaluate him, that Dr. Nyoka Cowden would have to do that. Have Dr. Nyoka Cowden call her.

## 2015-11-15 NOTE — Telephone Encounter (Signed)
Karena Addison, wife called and stated that it was very important to speak with you and wants you to call her today [phone]

## 2015-11-16 ENCOUNTER — Other Ambulatory Visit: Payer: Self-pay | Admitting: Cardiology

## 2015-11-16 ENCOUNTER — Inpatient Hospital Stay (HOSPITAL_COMMUNITY)
Admission: AD | Admit: 2015-11-16 | Discharge: 2015-11-23 | DRG: 291 | Disposition: A | Discharge status: Skilled Nursing Facility | Payer: Medicare Other | Source: Ambulatory Visit | Attending: Cardiology | Admitting: Cardiology

## 2015-11-16 DIAGNOSIS — Z955 Presence of coronary angioplasty implant and graft: Secondary | ICD-10-CM | POA: Diagnosis not present

## 2015-11-16 DIAGNOSIS — I255 Ischemic cardiomyopathy: Secondary | ICD-10-CM | POA: Diagnosis present

## 2015-11-16 DIAGNOSIS — I5023 Acute on chronic systolic (congestive) heart failure: Secondary | ICD-10-CM

## 2015-11-16 DIAGNOSIS — Y92009 Unspecified place in unspecified non-institutional (private) residence as the place of occurrence of the external cause: Secondary | ICD-10-CM | POA: Diagnosis not present

## 2015-11-16 DIAGNOSIS — N183 Chronic kidney disease, stage 3 unspecified: Secondary | ICD-10-CM | POA: Diagnosis present

## 2015-11-16 DIAGNOSIS — E1129 Type 2 diabetes mellitus with other diabetic kidney complication: Secondary | ICD-10-CM | POA: Diagnosis present

## 2015-11-16 DIAGNOSIS — I5022 Chronic systolic (congestive) heart failure: Secondary | ICD-10-CM | POA: Diagnosis present

## 2015-11-16 DIAGNOSIS — N400 Enlarged prostate without lower urinary tract symptoms: Secondary | ICD-10-CM | POA: Diagnosis present

## 2015-11-16 DIAGNOSIS — Z9119 Patient's noncompliance with other medical treatment and regimen: Secondary | ICD-10-CM | POA: Diagnosis not present

## 2015-11-16 DIAGNOSIS — I509 Heart failure, unspecified: Secondary | ICD-10-CM

## 2015-11-16 DIAGNOSIS — I2510 Atherosclerotic heart disease of native coronary artery without angina pectoris: Secondary | ICD-10-CM | POA: Diagnosis present

## 2015-11-16 DIAGNOSIS — Z87891 Personal history of nicotine dependence: Secondary | ICD-10-CM

## 2015-11-16 DIAGNOSIS — I130 Hypertensive heart and chronic kidney disease with heart failure and stage 1 through stage 4 chronic kidney disease, or unspecified chronic kidney disease: Principal | ICD-10-CM | POA: Diagnosis present

## 2015-11-16 DIAGNOSIS — K210 Gastro-esophageal reflux disease with esophagitis: Secondary | ICD-10-CM | POA: Diagnosis present

## 2015-11-16 DIAGNOSIS — Z833 Family history of diabetes mellitus: Secondary | ICD-10-CM

## 2015-11-16 DIAGNOSIS — E785 Hyperlipidemia, unspecified: Secondary | ICD-10-CM | POA: Diagnosis present

## 2015-11-16 DIAGNOSIS — Z8249 Family history of ischemic heart disease and other diseases of the circulatory system: Secondary | ICD-10-CM

## 2015-11-16 DIAGNOSIS — L89159 Pressure ulcer of sacral region, unspecified stage: Secondary | ICD-10-CM | POA: Diagnosis present

## 2015-11-16 DIAGNOSIS — T39315A Adverse effect of propionic acid derivatives, initial encounter: Secondary | ICD-10-CM | POA: Diagnosis present

## 2015-11-16 DIAGNOSIS — K219 Gastro-esophageal reflux disease without esophagitis: Secondary | ICD-10-CM | POA: Diagnosis present

## 2015-11-16 DIAGNOSIS — R4189 Other symptoms and signs involving cognitive functions and awareness: Secondary | ICD-10-CM | POA: Diagnosis not present

## 2015-11-16 DIAGNOSIS — Z6831 Body mass index (BMI) 31.0-31.9, adult: Secondary | ICD-10-CM | POA: Diagnosis not present

## 2015-11-16 DIAGNOSIS — L8990 Pressure ulcer of unspecified site, unspecified stage: Secondary | ICD-10-CM | POA: Insufficient documentation

## 2015-11-16 DIAGNOSIS — H9190 Unspecified hearing loss, unspecified ear: Secondary | ICD-10-CM | POA: Diagnosis present

## 2015-11-16 DIAGNOSIS — I350 Nonrheumatic aortic (valve) stenosis: Secondary | ICD-10-CM | POA: Diagnosis present

## 2015-11-16 DIAGNOSIS — E1122 Type 2 diabetes mellitus with diabetic chronic kidney disease: Secondary | ICD-10-CM | POA: Diagnosis present

## 2015-11-16 DIAGNOSIS — M545 Low back pain: Secondary | ICD-10-CM | POA: Diagnosis present

## 2015-11-16 LAB — TSH: TSH: 2.626 u[IU]/mL (ref 0.350–4.500)

## 2015-11-16 LAB — COMPREHENSIVE METABOLIC PANEL
ALBUMIN: 3.6 g/dL (ref 3.5–5.0)
ALT: 31 U/L (ref 17–63)
ANION GAP: 8 (ref 5–15)
AST: 33 U/L (ref 15–41)
Alkaline Phosphatase: 62 U/L (ref 38–126)
BUN: 32 mg/dL — ABNORMAL HIGH (ref 6–20)
CHLORIDE: 103 mmol/L (ref 101–111)
CO2: 32 mmol/L (ref 22–32)
CREATININE: 1.75 mg/dL — AB (ref 0.61–1.24)
Calcium: 8.9 mg/dL (ref 8.9–10.3)
GFR calc Af Amer: 39 mL/min — ABNORMAL LOW (ref >60)
GFR calc non Af Amer: 34 mL/min — ABNORMAL LOW (ref >60)
GLUCOSE: 101 mg/dL — AB (ref 65–99)
POTASSIUM: 3.6 mmol/L (ref 3.5–5.1)
SODIUM: 143 mmol/L (ref 135–145)
Total Bilirubin: 0.8 mg/dL (ref 0.3–1.2)
Total Protein: 6.1 g/dL — ABNORMAL LOW (ref 6.5–8.1)

## 2015-11-16 LAB — GLUCOSE, CAPILLARY
GLUCOSE-CAPILLARY: 128 mg/dL — AB (ref 65–99)
GLUCOSE-CAPILLARY: 141 mg/dL — AB (ref 65–99)
Glucose-Capillary: 135 mg/dL — ABNORMAL HIGH (ref 65–99)

## 2015-11-16 LAB — CBC WITH DIFFERENTIAL/PLATELET
BASOS ABS: 0 10*3/uL (ref 0.0–0.1)
BASOS PCT: 0 %
EOS ABS: 0.2 10*3/uL (ref 0.0–0.7)
Eosinophils Relative: 2 %
HCT: 38.3 % — ABNORMAL LOW (ref 39.0–52.0)
Hemoglobin: 11.5 g/dL — ABNORMAL LOW (ref 13.0–17.0)
Lymphocytes Relative: 16 %
Lymphs Abs: 1.1 10*3/uL (ref 0.7–4.0)
MCH: 27.3 pg (ref 26.0–34.0)
MCHC: 30 g/dL (ref 30.0–36.0)
MCV: 91 fL (ref 78.0–100.0)
MONO ABS: 0.5 10*3/uL (ref 0.1–1.0)
MONOS PCT: 6 %
Neutro Abs: 5.3 10*3/uL (ref 1.7–7.7)
Neutrophils Relative %: 76 %
PLATELETS: 145 10*3/uL — AB (ref 150–400)
RBC: 4.21 MIL/uL — ABNORMAL LOW (ref 4.22–5.81)
RDW: 16.3 % — AB (ref 11.5–15.5)
WBC: 7.1 10*3/uL (ref 4.0–10.5)

## 2015-11-16 MED ORDER — SODIUM CHLORIDE 0.9% FLUSH
3.0000 mL | Freq: Two times a day (BID) | INTRAVENOUS | Status: DC
  Administered 2015-11-16 – 2015-11-23 (×13): 3 mL via INTRAVENOUS

## 2015-11-16 MED ORDER — SPIRONOLACTONE 25 MG PO TABS
25.0000 mg | ORAL_TABLET | Freq: Every day | ORAL | Status: DC
  Administered 2015-11-16 – 2015-11-23 (×8): 25 mg via ORAL
  Filled 2015-11-16 (×9): qty 1

## 2015-11-16 MED ORDER — SACUBITRIL-VALSARTAN 24-26 MG PO TABS
1.0000 | ORAL_TABLET | Freq: Two times a day (BID) | ORAL | Status: DC
  Administered 2015-11-16 – 2015-11-23 (×13): 1 via ORAL
  Filled 2015-11-16 (×15): qty 1

## 2015-11-16 MED ORDER — ONDANSETRON HCL 4 MG/2ML IJ SOLN: 4.0000 mg | Freq: Four times a day (QID) | INTRAMUSCULAR | Status: DC | PRN

## 2015-11-16 MED ORDER — CARVEDILOL 3.125 MG PO TABS
3.1250 mg | ORAL_TABLET | Freq: Two times a day (BID) | ORAL | Status: DC
  Administered 2015-11-16 – 2015-11-23 (×11): 3.125 mg via ORAL
  Filled 2015-11-16 (×12): qty 1

## 2015-11-16 MED ORDER — ENOXAPARIN SODIUM 40 MG/0.4ML ~~LOC~~ SOLN
40.0000 mg | SUBCUTANEOUS | Status: DC
Start: 2015-11-16 — End: 2015-11-23
  Administered 2015-11-16 – 2015-11-22 (×7): 40 mg via SUBCUTANEOUS
  Filled 2015-11-16 (×7): qty 0.4

## 2015-11-16 MED ORDER — SODIUM CHLORIDE 0.9 % IV SOLN: 250.0000 mL | INTRAVENOUS | Status: DC | PRN

## 2015-11-16 MED ORDER — FUROSEMIDE 10 MG/ML IJ SOLN
80.0000 mg | Freq: Four times a day (QID) | INTRAMUSCULAR | Status: DC
  Administered 2015-11-16 – 2015-11-17 (×4): 80 mg via INTRAVENOUS
  Filled 2015-11-16 (×4): qty 8

## 2015-11-16 MED ORDER — INSULIN ASPART 100 UNIT/ML ~~LOC~~ SOLN
0.0000 [IU] | Freq: Three times a day (TID) | SUBCUTANEOUS | Status: DC
  Administered 2015-11-16: 2 [IU] via SUBCUTANEOUS
  Administered 2015-11-17: 3 [IU] via SUBCUTANEOUS
  Administered 2015-11-18: 2 [IU] via SUBCUTANEOUS
  Administered 2015-11-18 – 2015-11-19 (×2): 3 [IU] via SUBCUTANEOUS
  Administered 2015-11-19: 2 [IU] via SUBCUTANEOUS
  Administered 2015-11-20: 3 [IU] via SUBCUTANEOUS
  Administered 2015-11-20 – 2015-11-23 (×6): 2 [IU] via SUBCUTANEOUS

## 2015-11-16 MED ORDER — SODIUM CHLORIDE 0.9% FLUSH: 3.0000 mL | INTRAVENOUS | Status: DC | PRN

## 2015-11-16 MED ORDER — ACETAMINOPHEN 325 MG PO TABS
650.0000 mg | ORAL_TABLET | ORAL | Status: DC | PRN
  Administered 2015-11-17: 650 mg via ORAL
  Filled 2015-11-16: qty 2

## 2015-11-16 NOTE — H&P (Signed)
Jimmy Dawson    Date of visit:  11/16/2015 DOB:  Jan 09, 1929    Age:  82 yrs. Medical record number:  [id number]     Account number:  [id number] Primary Care Provider: GREEN III, ARTHUR G ____________________________ CURRENT DIAGNOSES  1. Dyspnea  2. Chronic systolic heart failure  3. Hypertensive heart disease without heart failure  4. CAD Native without angina  5. Aortic valve disorder, unspecified  6. Hyperlipidemia  7. Chronic kidney disease, stage 3 (moderate)  8. Type 2 diabetes mellitus without complications  9. Presence of coronary angioplasty implant and graft  10. Gastro-esophageal reflux disease without esophagitis ____________________________ ALLERGIES  No Known Allergies ____________________________ MEDICATIONS  1. omeprazole 40 mg Capsule, Delayed Release(E.C.), 1 p.o. daily  2. multivitamin Tablet, 1 p.o. daily  3. simvastatin 40 mg tablet, 1 p.o. daily  4. Metamucil 3.4 gram/12 gram Powder, qd  5. aspirin 81 mg chewable tablet, 1 p.o. daily  6. metformin 500 mg tablet, 2 p.o. b.i.d.  7. glimepiride 2 mg tablet, 1 p.o. daily  8. carvedilol 3.125 mg tablet, BID  9. ibuprofen 200 mg tablet, PRN  10. spironolactone 25 mg tablet, 1 p.o. daily  11. ferrous sulfate 325 mg (65 mg iron) tablet, TID  12. torsemide 20 mg tablet, BID  13. sennosides 8.6 mg tablet, PRN  14. Colace 100 mg capsule, PRN  15. Entresto 24 mg-26 mg tablet, BID ____________________________ CHIEF COMPLAINTS  worsening dyspnea and edema ____________________________ HISTORY OF PRESENT ILLNESS Patient seen early for evaluation of worsening edema shortness of breath. He has known chronic systolic heart failure with an ejection fraction of 20-25%. He has had some memory issues and things have been difficult at home with increasing combativeness. He has had a 30 pound weight gain since he was seen 2 months ago. He has had worsening shortness of breath and his primary care doctor changed him to torsemide  twice a day couple of weeks ago. His wife called Friday with increasing edema weight gain and was distraught over his combativeness and refusal to some medical care. He was not willing to go to the hospital. We have talked to the wife again on 2 occasions and she is insistent that the memory issues are a problem. Compliance with diuretics at home I think is good blood he has had progressive weight gain and was admitted this time for intravenous diuresis as he has a 30 pound weight gain previously. He has no angina. We previously have had him on both Entresto and COrlanor but he is no longer taking the Corlanor. He has severe dyspnea with exertion and significant edema. He has some orthopnea. He is oriented to date but does note some short-term memory issues. Difficult to tell if he is restricting fluids at home or not. ____________________________ PAST HISTORY  Past Medical Illnesses:  hypertension, DM-non-insulin dependent, hyperlipidemia, obesity, GERD, BPH, TIA, chronic kidney disease Stage 3, dementia;  Cardiovascular Illnesses:  chronic systolic heart failure, CAD;  Surgical Procedures:  TURP;  NYHA Classification:  II;  Canadian Angina Classification:  Class 0: Asymptomatic;  Cardiology Procedures-Invasive:  Penta stent February 2002 Dr. Glade Lloyd, cardiac cath (left) February 2016;  Cardiology Procedures-Noninvasive:  echocardiogram December 2016;  Cardiac Cath Results:  40% ostiall Left main, 40% proximal LAD, patent stent mid LAD, 95% stenosis proximal Diag 1, scattered irregularities CFX, scattered irregularities RCA;  LVEF of 20% documented via echocardiogram on 03/07/2015,   ____________________________ CARDIO-PULMONARY TEST DATES EKG Date:  11/16/2015;   Cardiac Cath Date:  05/24/2014;  Stent Placement Date: 05/08/2000;  Nuclear Study Date:  05/06/2014;  Echocardiography Date: 03/07/2015;  Chest Xray Date: 03/07/2015;   ____________________________ FAMILY HISTORY Brother -- Brother dead,  Myocardial infarction Brother -- Brother dead, Unknown disease Brother -- Brother dead, Unknown disease Father -- Father dead, Myocardial infarction Mother -- Mother dead, Diabetes mellitus, Coronary Artery Disease ____________________________ SOCIAL HISTORY Alcohol Use:  does not use alcohol;  Smoking:  nonsmoker;  Lifestyle:  widower and remarried 12 years;  Exercise:  no regular exercise;  Occupation:  [employer]-retired;  Residence:  lives with wife;   ____________________________ REVIEW OF SYSTEMS General:  malaise and fatigue, weight gain of approximately 30 lbs  Integumentary:no rashes or new skin lesions. Eyes: decreased acuity O.D. Ears, Nose, Throat, Mouth:  denies any hearing loss, epistaxis, hoarseness or difficulty speaking. Respiratory: dyspnea with exertion Cardiovascular:  please review HPI Abdominal: denies dyspepsia, GI bleeding, constipation, or diarrhea Genitourinary-Male: frequency, nocturia, penile edema  Musculoskeletal:  chronic low back pain Neurological:  memory loss Psychiatric:  agitation  ____________________________ PHYSICAL EXAMINATION VITAL SIGNS  Blood Pressure:  100/58 Sitting, Right arm, regular cuff  , 102/60 Standing, Right arm and regular cuff   Pulse:  72/min. Weight:  208.00 lbs. Height:  65"BMI: 34  Constitutional:  pleasant white male in no acute distress, mildly obese in wheelchair Skin:  warm and dry to touch, no apparent skin lesions, or masses noted. Head:  normocephalic, normal hair pattern, no masses or tenderness Eyes:  strabismus ENT:  ears, nose and throat reveal no gross abnormalities.  Dentition good. Neck:  no bruits, no masses, non-tender, JVD at 45 degrees 6cm Chest:  normal symmetry, clear to auscultation. Cardiac:  regular rhythm, normal S1 and S2, no murmur, grade 2/6 systolic murmur at aortic area radiating to neck Abdomen:  abdomen soft,non-tender, no masses, no hepatospenomegaly, or aneurysm noted Peripheral Pulses:  femoral  pulses 2+, posterior tibial pulses 3+ Genitalia Male:  scrotal and penile edema Extremities & Back:  mild bilateral venous insufficiency changes present, 3+ edema Neurological:  no gross motor or sensory deficits noted, affect appropriate, oriented x3. ____________________________ MOST RECENT LIPID PANEL 02/28/15  CHOL TOTL 110 mg/dl, LDL 55 NM, HDL 38 mg/dl and TRIGLYCER 83 mg/dl ____________________________ IMPRESSIONS/PLAN  1. Acute on chronic systolic heart failure with recent worsening 2. Dementia 3. Coronary artery disease without angina 4. Mild/moderate aortic stenosis 5. Chronic kidney disease stage III 6. History of diabetes  Recommendations:  He is admitted for intravenous diuresis. He will need to have a social work consultation and internal medicine consultation regarding his dementia. Continue his other medications. Repeat echocardiogram. EKG shows poor R wave progression and sinus tachycardia.   ____________________________ TODAYS ORDERS  1. 12 Lead EKG: Today  2. Admit to hospital for diuresis                       ____________________________ Cardiology Physician:  Kerry Hough MD [facility]

## 2015-11-16 NOTE — Progress Notes (Signed)
11/16/2015 1100 Received to room 2W10 a direct admit from Dr. Gabriel Carina.  Pt is A&O, no complaints of voiced.  Tele monitor applied and CCMD notified.  Oriented pt and wife to room, call light and bed.  Call bell in reach and wife at bedside.  Attempted to get EKG per order but pt and wife stated they did not want another EKG to be done that there had already been 2 done today and they didn't want to be charged for another one. Carney Corners

## 2015-11-16 NOTE — Progress Notes (Addendum)
Pt had 10 beat run vtach.  Pt asymptomatic.  MD Turner paged/notified, new orders received.  RN will continue to monitor.  Claudette Stapler, RN

## 2015-11-17 ENCOUNTER — Inpatient Hospital Stay (HOSPITAL_COMMUNITY): Payer: Medicare Other

## 2015-11-17 LAB — BASIC METABOLIC PANEL
ANION GAP: 6 (ref 5–15)
BUN: 30 mg/dL — ABNORMAL HIGH (ref 6–20)
CALCIUM: 8.9 mg/dL (ref 8.9–10.3)
CO2: 36 mmol/L — ABNORMAL HIGH (ref 22–32)
Chloride: 97 mmol/L — ABNORMAL LOW (ref 101–111)
Creatinine, Ser: 1.75 mg/dL — ABNORMAL HIGH (ref 0.61–1.24)
GFR calc non Af Amer: 34 mL/min — ABNORMAL LOW (ref >60)
GFR, EST AFRICAN AMERICAN: 39 mL/min — AB (ref >60)
Glucose, Bld: 104 mg/dL — ABNORMAL HIGH (ref 65–99)
Potassium: 4.2 mmol/L (ref 3.5–5.1)
Sodium: 139 mmol/L (ref 135–145)

## 2015-11-17 LAB — GLUCOSE, CAPILLARY
GLUCOSE-CAPILLARY: 88 mg/dL (ref 65–99)
GLUCOSE-CAPILLARY: 115 mg/dL — AB (ref 65–99)
GLUCOSE-CAPILLARY: 89 mg/dL (ref 65–99)
Glucose-Capillary: 183 mg/dL — ABNORMAL HIGH (ref 65–99)

## 2015-11-17 LAB — MAGNESIUM: Magnesium: 1.7 mg/dL (ref 1.7–2.4)

## 2015-11-17 LAB — HEMOGLOBIN A1C
HEMOGLOBIN A1C: 7 % — AB (ref 4.8–5.6)
MEAN PLASMA GLUCOSE: 154 mg/dL

## 2015-11-17 LAB — VITAMIN B12: VITAMIN B 12: 570 pg/mL (ref 180–914)

## 2015-11-17 MED ORDER — TRAMADOL HCL 50 MG PO TABS
50.0000 mg | ORAL_TABLET | Freq: Four times a day (QID) | ORAL | Status: DC | PRN
  Administered 2015-11-17 – 2015-11-20 (×2): 50 mg via ORAL
  Filled 2015-11-17 (×2): qty 1

## 2015-11-17 MED ORDER — FUROSEMIDE 10 MG/ML IJ SOLN
80.0000 mg | Freq: Three times a day (TID) | INTRAMUSCULAR | Status: DC
  Administered 2015-11-17 – 2015-11-20 (×7): 80 mg via INTRAVENOUS
  Filled 2015-11-17 (×9): qty 8

## 2015-11-17 NOTE — Progress Notes (Addendum)
Subjective:  Major complaint this morning is low back pain.  He has diuresed since yesterday and weight is down.  He has been using ibuprofen at home and this likely plus his dementia has accounted for the 30 pound weight gain and worsening of his heart failure.  No chest discomfort.  He is oriented to place.  Renal function stable with morning.    Objective:  Vital Signs in the last 24 hours: BP 106/62   Pulse 96   Temp 97.7 F (36.5 C) (Oral)   Resp 20   Ht 5\' 6"  (1.676 m)   Wt 88.7 kg (195 lb 8 oz)   SpO2 99%   BMI 31.55 kg/m   Physical Exam: Elderly white male currently in no acute distress Lungs:  Mild crackles at the base  Cardiac:  Regular rhythm, normal S1 and S2, no S3, 1 to 2/6 systolic murmur across the aortic valve, JV D still elevated Abdomen:  Soft, nontender, no masses Extremities:  3+ edema present  Intake/Output from previous day: 08/16 0701 - 08/17 0700 In: 243 [P.O.:240; I.V.:3] Out: 8251 [Urine:8250; Stool:1] Weight Filed Weights   11/16/15 1056 11/17/15 0453  Weight: 93.8 kg (206 lb 12.8 oz) 88.7 kg (195 lb 8 oz)    Lab Results: Basic Metabolic Panel:  Recent Labs  11/16/15 1204 11/17/15 0036  NA 143 139  K 3.6 4.2  CL 103 97*  CO2 32 36*  GLUCOSE 101* 104*  BUN 32* 30*  CREATININE 1.75* 1.75*    CBC:  Recent Labs  11/16/15 1204  WBC 7.1  NEUTROABS 5.3  HGB 11.5*  HCT 38.3*  MCV 91.0  PLT 145*    BNP    Component Value Date/Time   BNP 4,007.6 (H) 07/20/2015 0910   BNP 3,397.5 (H) 03/07/2015 2030   Telemetry: Sinus rhythm, one 10 beat run of ventricular tachycardia noted overnight  Assessment/Plan:  1.  Acute on chronic systolic congestive heart failure decompensated likely due to ibuprofen as well as dementia with some medical noncompliance at home 2.  Significant dementia affecting his situation 3.  Coronary artery disease 4.  Stage III chronic kidney disease 5.  Diabetes mellitus  RECOMMENDATIONS:  Will back off  intravenous furosemide somewhat in view of brisk diuresis.  Needs continued diuresis.  I spoke to neurology who recommended B-12 and thyroid levels and outpatient follow-up regarding dementia assessment.  We'll also need to have wound care see because of pressure ulcer that was present on admission.     Kerry Hough  MD [facility] Cardiology  11/17/2015, 8:56 AM

## 2015-11-17 NOTE — Progress Notes (Signed)
*  PRELIMINARY RESULTS* Echocardiogram 2D Echocardiogram has been performed.  Leavy Cella 11/17/2015, 11:36 AM

## 2015-11-17 NOTE — Care Management Note (Signed)
Case Management Note Marvetta Gibbons RN, BSN Unit 2W-Case Manager [phone]  Patient Details  Name: Jimmy Dawson MRN: [id number] Date of Birth: 1928/10/28  Subjective/Objective:   Pt admitted with acute on chronic HF                 Action/Plan: PTA pt lived at home with wife- spoke with wife and granddaughter at bedside- wife has concerns about being about to care for pt at home- states that pt has dementia and she can not really handle him at home- pt still drives although he is not suppose to, [facility] has been suggested in the past- however per wife-pt has refused- wife would like to look at [facility] rehab- PT eval has been ordered and is pending- not sure if pt would be agreeable- may need MD to weigh in on if pt has the capacity to make his own decisions- or if we need psych eval to assist with this-? CM and CSW to f/u on d/c plan after PT eval.   Expected Discharge Date:                  Expected Discharge Plan:  Skilled Nursing Facility  In-House Referral:  Clinical Social Work  Discharge planning Services  CM Consult  Post Acute Care Choice:    Choice offered to:     DME Arranged:    DME Agency:     HH Arranged:    [place] Agency:     Status of Service:  In process, will continue to follow  If discussed at Long Length of Stay Meetings, dates discussed:    Additional Comments:  Dawayne Patricia, RN 11/17/2015, 3:29 PM

## 2015-11-17 NOTE — Clinical Social Work Note (Signed)
CSW and RNCM met with patient, patient's wife, and patient's granddaughter. Patient's family expressed interest in short term rehab upon discharge. CSW will follow up with patient and patient's family after PT evaluation.
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
## 2015-11-18 ENCOUNTER — Encounter (HOSPITAL_COMMUNITY): Payer: Self-pay | Admitting: General Practice

## 2015-11-18 DIAGNOSIS — R4189 Other symptoms and signs involving cognitive functions and awareness: Secondary | ICD-10-CM

## 2015-11-18 DIAGNOSIS — L8990 Pressure ulcer of unspecified site, unspecified stage: Secondary | ICD-10-CM | POA: Insufficient documentation

## 2015-11-18 LAB — ECHOCARDIOGRAM COMPLETE
AO mean calculated velocity dopler: 157 cm/s
AOPV: 0.25 m/s
AOVTI: 45.8 cm
AV Area VTI: 0.88 cm2
AV Area mean vel: 0.94 cm2
AV Mean grad: 11 mmHg
AV area mean vel ind: 0.47 cm2/m2
AV peak Index: 0.44
AV vel: 0.85
AVA: 0.85 cm2
AVAREAVTIIND: 0.43 cm2/m2
AVCELMEANRAT: 0.27
AVLVOTPG: 2 mmHg
AVPG: 24 mmHg
AVPKVEL: 244 cm/s
CHL CUP AV VALUE AREA INDEX: 0.43
CHL CUP MV DEC (S): 158
CHL CUP RV SYS PRESS: 39 mmHg
EERAT: 23.02
EWDT: 158 ms
FS: 9 % — AB (ref 28–44)
HEIGHTINCHES: 66 in
IVS/LV PW RATIO, ED: 0.76
LA ID, A-P, ES: 54 mm
LA vol A4C: 132 ml
LA vol: 150 mL
LADIAMINDEX: 2.73 cm/m2
LAVOLIN: 75.8 mL/m2
LDCA: 3.46 cm2
LEFT ATRIUM END SYS DIAM: 54 mm
LV E/e' medial: 23.02
LV PW d: 20.1 mm — AB (ref 0.6–1.1)
LV TDI E'LATERAL: 4.14
LV e' LATERAL: 4.14 cm/s
LVEEAVG: 23.02
LVOT SV: 39 mL
LVOT VTI: 11.3 cm
LVOT diameter: 21 mm
LVOT peak VTI: 0.25 cm
LVOT peak vel: 62 cm/s
MV pk A vel: 44.9 m/s
MVPG: 4 mmHg
MVPKEVEL: 95.3 m/s
Reg peak vel: 278 cm/s
TR max vel: 278 cm/s
WEIGHTICAEL: 3128 [oz_av]

## 2015-11-18 LAB — BASIC METABOLIC PANEL
ANION GAP: 10 (ref 5–15)
BUN: 28 mg/dL — ABNORMAL HIGH (ref 6–20)
CALCIUM: 8.6 mg/dL — AB (ref 8.9–10.3)
CO2: 32 mmol/L (ref 22–32)
CREATININE: 1.55 mg/dL — AB (ref 0.61–1.24)
Chloride: 97 mmol/L — ABNORMAL LOW (ref 101–111)
GFR calc Af Amer: 45 mL/min — ABNORMAL LOW (ref >60)
GFR, EST NON AFRICAN AMERICAN: 39 mL/min — AB (ref >60)
GLUCOSE: 76 mg/dL (ref 65–99)
Potassium: 4 mmol/L (ref 3.5–5.1)
Sodium: 139 mmol/L (ref 135–145)

## 2015-11-18 LAB — GLUCOSE, CAPILLARY
GLUCOSE-CAPILLARY: 165 mg/dL — AB (ref 65–99)
GLUCOSE-CAPILLARY: 148 mg/dL — AB (ref 65–99)
Glucose-Capillary: 93 mg/dL (ref 65–99)
Glucose-Capillary: 131 mg/dL — ABNORMAL HIGH (ref 65–99)

## 2015-11-18 MED ORDER — IVABRADINE HCL 5 MG PO TABS
5.0000 mg | ORAL_TABLET | Freq: Two times a day (BID) | ORAL | Status: DC
  Administered 2015-11-18 – 2015-11-21 (×7): 5 mg via ORAL
  Filled 2015-11-18 (×11): qty 1

## 2015-11-18 MED ORDER — CETYLPYRIDINIUM CHLORIDE 0.05 % MT LIQD
7.0000 mL | Freq: Two times a day (BID) | OROMUCOSAL | Status: DC
  Administered 2015-11-18 – 2015-11-22 (×7): 7 mL via OROMUCOSAL

## 2015-11-18 MED ORDER — POVIDONE-IODINE 10 % EX SOLN
Freq: Two times a day (BID) | CUTANEOUS | Status: DC | PRN
  Filled 2015-11-18: qty 118

## 2015-11-18 NOTE — Evaluation (Signed)
Physical Therapy Evaluation Patient Details Name: Jimmy Dawson MRN: [id number] DOB: 09-Mar-1929 Today's Date: 11/18/2015   History of Present Illness  82 yo admitted with CHF exacerbation edema and dyspnea. PMHx: CHF, CAD, HLD, CKD, DM, GERD, dementia  Clinical Impression  Pt pleasant and willing to mobilize. Pt reports a sedentary lifestyle at home. His greatest activity is when he drives to the store and walks at least 50' out of the house and into the store to a motorized cart. Pt states he tries to follow an appropriate diet but when asked about canned soups and vegetables he states he does eat those. Pt with decreased balance, reaching out for environmental support without use of RW and educated for benefit and need to maintain RW use however pt does not demonstrate acceptance of this recommendation. Pt with decreased activity tolerance who will benefit from acute therapy to maximize mobility, function and gait to decrease burden of care. Family requesting ST-SNF due to cognition and physical function. Pt with difficulty with word finding, recalling home function for certain questions like cooking and states he doesn't agree with prior recommendation not to drive. Pt educated for need to increase mobility for heart health and function.     Follow Up Recommendations Supervision for mobility/OOB;SNF per family inability to provide assist and supervision at home    Equipment Recommendations  None recommended by PT    Recommendations for Other Services       Precautions / Restrictions Precautions Precautions: Fall      Mobility  Bed Mobility Overal bed mobility: Modified Independent                Transfers Overall transfer level: Needs assistance   Transfers: Sit to/from Stand Sit to Stand: Supervision         General transfer comment: cues for hand placement  Ambulation/Gait Ambulation/Gait assistance: Min guard Ambulation Distance (Feet): 125 Feet Assistive  device: Rolling walker (2 wheeled) Gait Pattern/deviations: Step-through pattern;Decreased stride length;Trunk flexed   Gait velocity interpretation: at or above normal speed for age/gender General Gait Details: cues for posture, position in RW, safety and directional cues  Stairs            Wheelchair Mobility    Modified Rankin (Stroke Patients Only)       Balance                                             Pertinent Vitals/Pain Pain Assessment: No/denies pain    Home Living Family/patient expects to be discharged to:: Private residence Living Arrangements: Spouse/significant other Available Help at Discharge: Family;Available 24 hours/day Type of Home: House Home Access: Stairs to enter Entrance Stairs-Rails: Psychiatric nurse of Steps: 5 Home Layout: Two level;Full bath on main level Home Equipment: Walker - 2 wheels;Cane - single point      Prior Function Level of Independence: Independent with assistive device(s)         Comments: Pt reports he stands to bathe and dress, assists with cooking and cleaning, walks with a cane at times, drives even though he isn't supposed to     Hand Dominance        Extremity/Trunk Assessment   Upper Extremity Assessment: Overall WFL for tasks assessed           Lower Extremity Assessment: Overall WFL for tasks assessed  Cervical / Trunk Assessment: Kyphotic  Communication   Communication: HOH  Cognition Arousal/Alertness: Awake/alert Behavior During Therapy: WFL for tasks assessed/performed Overall Cognitive Status: History of cognitive impairments - at baseline                      General Comments      Exercises        Assessment/Plan    PT Assessment Patient needs continued PT services  PT Diagnosis Difficulty walking   PT Problem List Decreased activity tolerance;Decreased mobility;Decreased safety awareness  PT Treatment Interventions Gait  training;Stair training;Functional mobility training;Balance training;Therapeutic exercise;Therapeutic activities;DME instruction;Patient/family education   PT Goals (Current goals can be found in the Care Plan section) Acute Rehab PT Goals Patient Stated Goal: return home PT Goal Formulation: With patient/family Time For Goal Achievement: 12/02/15 Potential to Achieve Goals: Good    Frequency Min 3X/week   Barriers to discharge Decreased caregiver support per family wife is having difficulty caring for pt at home    Co-evaluation               End of Session Equipment Utilized During Treatment: Gait belt Activity Tolerance: Patient tolerated treatment well Patient left: in chair;with call bell/phone within reach;with family/visitor present Nurse Communication: Mobility status         Time: 1200-1223 PT Time Calculation (min) (ACUTE ONLY): 23 min   Charges:   PT Evaluation $PT Eval Moderate Complexity: 1 Procedure     PT G CodesMelford Aase 11/18/2015, 12:54 PM  Elwyn Reach, [place]

## 2015-11-18 NOTE — Consult Note (Signed)
[facility] Face-to-Face Psychiatry Consult   Reason for Consult:  Capacity evaluation Referring Physician:  Dr. Wynonia Lawman Patient Identification: Jimmy Dawson MRN:  [id number] Principal Diagnosis: Acute on chronic systolic congestive heart failure [facility]) Diagnosis:   Patient Active Problem List   Diagnosis Date Noted  . Pressure ulcer [L89.90] 11/18/2015  . Acute on chronic systolic congestive heart failure ([place]) [I50.23] 11/16/2015  . Acute on chronic systolic (congestive) heart failure ([place]) [I50.23] 11/16/2015  . Urine incontinence [R32] 10/19/2015  . Hearing loss [H91.90] 07/20/2015  . Essential hypertension [I10]   . BCC (basal cell carcinoma), face [C44.310] 10/06/2014  . Actinic keratosis [L57.0] 10/06/2014  . Chronic systolic heart failure ([place]) [I50.22] 04/13/2014  . Aortic valve disease [I35.9]   . CAD (coronary artery disease) [I25.10]   . Hypertensive heart disease [I11.9]   . Cerebrovascular disease [I67.9] 10/22/2012  . DM (diabetes mellitus), type 2 with renal complications ([place]) [K93.26] 06/18/2012  . Stage III chronic kidney disease [N18.3]   . Hyperlipidemia [E78.5] 06/17/2006  . BPH (benign prostatic hypertrophy) [N40.0] 11/13/2004  . PSA elevation [R97.20] 11/13/2004  . Reflux esophagitis [K21.0] 02/29/2004  . Mild memory disturbance [R41.3] 11/02/2002  . Unspecified sleep apnea [G47.30] 05/06/2002    Total Time spent with patient: 45 minutes  Subjective:   Jimmy Dawson is a 82 y.o. male patient admitted with dementia and shortness of breath.  HPI:  Jimmy Dawson is 82 years old male seen, chart reviewed for the face-to-face psychiatric consultation and evaluation of capacity. Patient appeared lying down on his bed, awake, alert, oriented to time place and person. Patient granddaughter visiting at bedside who is also able to participate in this capacity evaluation. Patient reported that he came to the hospital because of shortness of breath and Dr. Wynonia Lawman told him he  has a fluid in his body and needed treatment. Reportedly patient has been independent ADLs and also able to drive around for local shopping. Patient endorses difficulty to recall the names and slow processing. Patient is able to answer most of the questions related to Mini-Mental Status Examination except delayed memory recall. Patient has intact cognitions including orientation, immediate memory, language functions and concentration. Based on my evaluation patient has capacity to make his own medical decisions and living arrangements.  Medical history: Patient seen early for evaluation of worsening edema shortness of breath. He has known chronic systolic heart failure with an ejection fraction of 20-25%. He has had some mesues and things have been difficult at home with increasing combativeness. He has had a 30 pound weight gain since he was seen 2 months ago. He has had worsening shortness of breath and his primary care doctor changed him to torsemide twice a day couple of weeks ago. His wife called Friday with increasing edema weight gain and was distraught over his combativeness and refusal to some medical care. He was not willing to go to the hospital. We have talked to the wife again on 2 occasions and she is insistent that the memory issues are a problem. Compliance with diuretics at home I think is good blood he has had progressive weight gain and was admitted this time for intravenous diuresis as he has a 30 pound weight gain previously. He has no angina. We previously have had him on both Entresto and COrlanor but he is no longer taking the Corlanor. He has severe dyspnea with exertion and significant edema. He has some orthopnea. He is oriented to date but does note some short-term memory  issues. Difficult to tell if he is restricting fluids at home or not  Past Psychiatric History: memory deficits vs Dementia ?  Risk to Self: Is patient at risk for suicide?: No Risk to Others:   Prior Inpatient  Therapy:   Prior Outpatient Therapy:    Past Medical History:  Past Medical History:  Diagnosis Date  . Aortic valve disease   . CAD (coronary artery disease)   . CAP (community acquired pneumonia) 09/2013   Archie Endo 10/03/2013  . CHF (congestive heart failure) ([place])   . Chronic kidney disease (CKD), stage III (moderate)   . Diastasis of muscle   . DM (diabetes mellitus), type 2, uncontrolled, with renal complications ([place]) 9/32/3557  . Elevated prostate specific antigen (PSA)   . GERD (gastroesophageal reflux disease)   . Hyperlipidemia   . Hypertensive heart disease   . Pneumonia    "small case"  . Sun stroke ~ 2014   "haven't had any strength since"  . Tuberculosis    "when I was a child they said I'd had a spot on my lungs & that it was TB but it was ok then"  . Type II diabetes mellitus ([place])   . Unspecified disorder of kidney and ureter     Past Surgical History:  Procedure Laterality Date  . CARDIAC CATHETERIZATION  05/2014  . CORONARY ANGIOPLASTY WITH STENT PLACEMENT  05/2000  . FINGER SURGERY Right 1955   INDEX AND RIGHT MIDDLE FINGER  . LEFT AND RIGHT HEART CATHETERIZATION WITH CORONARY ANGIOGRAM N/A 05/24/2014   Procedure: LEFT AND RIGHT HEART CATHETERIZATION WITH CORONARY ANGIOGRAM;  Surgeon: Jacolyn Reedy, MD;  Location: [facility] CATH LAB;  Service: Cardiovascular;  Laterality: N/A;  . SP INTRO URET CATH PERC  03-1999  . TRANSURETHRAL RESECTION OF PROSTATE  10/2000   Archie Endo 08/16/2010   Family History:  Family History  Problem Relation Age of Onset  . Heart disease Mother   . Diabetes Mother   . Heart disease Father   . Heart attack Father   . Heart disease Sister   . Diabetes Sister   . Heart disease Brother   . Diabetes Brother   . Diabetes Brother   . Heart disease Brother   . Diabetes Brother   . Heart disease Brother    Family Psychiatric  History: Denies family history of mental illness and dementia Social History:  History  Alcohol Use  . 0.0 oz/week     Comment: 03/07/2015 "maybe once/year"     History  Drug Use No    Social History   Social History  . Marital status: Married    Spouse name: Karena Addison  . Number of children: 2  . Years of education: 16   Occupational History  .      retired Surveyor, mining   Social History Main Topics  . Smoking status: Former Smoker    Packs/day: 0.50    Years: 1.00    Types: Cigarettes    Quit date: 04/02/1968  . Smokeless tobacco: Former Systems developer    Types: Chew     Comment: "chewed very little'  . Alcohol use 0.0 oz/week     Comment: 03/07/2015 "maybe once/year"  . Drug use: No  . Sexual activity: Not Asked   Other Topics Concern  . None   Social History Narrative   Married, live in [place] level home, lives w/ spouse, has a living well, retired Surveyor, mining. Does not use caffeine, exerise walk. Has dog.   Additional  Social History:    Allergies:  No Known Allergies  Labs:  Results for orders placed or performed during the hospital encounter of 11/16/15 (from the past 48 hour(s))  Glucose, capillary     Status: Abnormal   Collection Time: 11/16/15 11:23 AM  Result Value Ref Range   Glucose-Capillary 128 (H) 65 - 99 mg/dL   Comment 1 Notify RN    Comment 2 Document in Chart   CBC WITH DIFFERENTIAL     Status: Abnormal   Collection Time: 11/16/15 12:04 PM  Result Value Ref Range   WBC 7.1 4.0 - 10.5 K/uL   RBC 4.21 (L) 4.22 - 5.81 MIL/uL   Hemoglobin 11.5 (L) 13.0 - 17.0 g/dL   HCT 38.3 (L) 39.0 - 52.0 %   MCV 91.0 78.0 - 100.0 fL   MCH 27.3 26.0 - 34.0 pg   MCHC 30.0 30.0 - 36.0 g/dL   RDW 16.3 (H) 11.5 - 15.5 %   Platelets 145 (L) 150 - 400 K/uL   Neutrophils Relative % 76 %   Neutro Abs 5.3 1.7 - 7.7 K/uL   Lymphocytes Relative 16 %   Lymphs Abs 1.1 0.7 - 4.0 K/uL   Monocytes Relative 6 %   Monocytes Absolute 0.5 0.1 - 1.0 K/uL   Eosinophils Relative 2 %   Eosinophils Absolute 0.2 0.0 - 0.7 K/uL   Basophils Relative 0 %   Basophils Absolute 0.0 0.0 - 0.1 K/uL  Comprehensive  metabolic panel     Status: Abnormal   Collection Time: 11/16/15 12:04 PM  Result Value Ref Range   Sodium 143 135 - 145 mmol/L   Potassium 3.6 3.5 - 5.1 mmol/L   Chloride 103 101 - 111 mmol/L   CO2 32 22 - 32 mmol/L   Glucose, Bld 101 (H) 65 - 99 mg/dL   BUN 32 (H) 6 - 20 mg/dL   Creatinine, Ser 1.75 (H) 0.61 - 1.24 mg/dL   Calcium 8.9 8.9 - 10.3 mg/dL   Total Protein 6.1 (L) 6.5 - 8.1 g/dL   Albumin 3.6 3.5 - 5.0 g/dL   AST 33 15 - 41 U/L   ALT 31 17 - 63 U/L   Alkaline Phosphatase 62 38 - 126 U/L   Total Bilirubin 0.8 0.3 - 1.2 mg/dL   GFR calc non Af Amer 34 (L) >60 mL/min   GFR calc Af Amer 39 (L) >60 mL/min    Comment: (NOTE) The eGFR has been calculated using the CKD EPI equation. This calculation has not been validated in all clinical situations. eGFR's persistently <60 mL/min signify possible Chronic Kidney Disease.    Anion gap 8 5 - 15  TSH     Status: None   Collection Time: 11/16/15 12:04 PM  Result Value Ref Range   TSH 2.626 0.350 - 4.500 uIU/mL  Hemoglobin A1c     Status: Abnormal   Collection Time: 11/16/15 12:04 PM  Result Value Ref Range   Hgb A1c MFr Bld 7.0 (H) 4.8 - 5.6 %    Comment: (NOTE)         Pre-diabetes: 5.7 - 6.4         Diabetes: >6.4         Glycemic control for adults with diabetes: <7.0    Mean Plasma Glucose 154 mg/dL    Comment: (NOTE) Performed At: [facility] [address] [place], [place] [id number] Lindon Romp MD NW:[id number]   Glucose, capillary     Status: Abnormal  Collection Time: 11/16/15  4:25 PM  Result Value Ref Range   Glucose-Capillary 141 (H) 65 - 99 mg/dL   Comment 1 Notify RN    Comment 2 Document in Chart   Glucose, capillary     Status: Abnormal   Collection Time: 11/16/15  9:10 PM  Result Value Ref Range   Glucose-Capillary 135 (H) 65 - 99 mg/dL  Basic metabolic panel     Status: Abnormal   Collection Time: 11/17/15 12:36 AM  Result Value Ref Range   Sodium 139 135 - 145 mmol/L    Potassium 4.2 3.5 - 5.1 mmol/L   Chloride 97 (L) 101 - 111 mmol/L   CO2 36 (H) 22 - 32 mmol/L   Glucose, Bld 104 (H) 65 - 99 mg/dL   BUN 30 (H) 6 - 20 mg/dL   Creatinine, Ser 1.75 (H) 0.61 - 1.24 mg/dL   Calcium 8.9 8.9 - 10.3 mg/dL   GFR calc non Af Amer 34 (L) >60 mL/min   GFR calc Af Amer 39 (L) >60 mL/min    Comment: (NOTE) The eGFR has been calculated using the CKD EPI equation. This calculation has not been validated in all clinical situations. eGFR's persistently <60 mL/min signify possible Chronic Kidney Disease.    Anion gap 6 5 - 15  Magnesium     Status: None   Collection Time: 11/17/15 12:36 AM  Result Value Ref Range   Magnesium 1.7 1.7 - 2.4 mg/dL  Glucose, capillary     Status: None   Collection Time: 11/17/15  6:31 AM  Result Value Ref Range   Glucose-Capillary 88 65 - 99 mg/dL  Vitamin B12     Status: None   Collection Time: 11/17/15  9:31 AM  Result Value Ref Range   Vitamin B-12 570 180 - 914 pg/mL    Comment: (NOTE) This assay is not validated for testing neonatal or myeloproliferative syndrome specimens for Vitamin B12 levels.   Glucose, capillary     Status: Abnormal   Collection Time: 11/17/15 11:20 AM  Result Value Ref Range   Glucose-Capillary 115 (H) 65 - 99 mg/dL   Comment 1 Notify RN   Glucose, capillary     Status: Abnormal   Collection Time: 11/17/15  4:27 PM  Result Value Ref Range   Glucose-Capillary 183 (H) 65 - 99 mg/dL  Glucose, capillary     Status: None   Collection Time: 11/17/15  9:45 PM  Result Value Ref Range   Glucose-Capillary 89 65 - 99 mg/dL   Comment 1 Notify RN    Comment 2 Document in Chart   Basic metabolic panel     Status: Abnormal   Collection Time: 11/18/15  2:20 AM  Result Value Ref Range   Sodium 139 135 - 145 mmol/L   Potassium 4.0 3.5 - 5.1 mmol/L   Chloride 97 (L) 101 - 111 mmol/L   CO2 32 22 - 32 mmol/L   Glucose, Bld 76 65 - 99 mg/dL   BUN 28 (H) 6 - 20 mg/dL   Creatinine, Ser 1.55 (H) 0.61 - 1.24  mg/dL   Calcium 8.6 (L) 8.9 - 10.3 mg/dL   GFR calc non Af Amer 39 (L) >60 mL/min   GFR calc Af Amer 45 (L) >60 mL/min    Comment: (NOTE) The eGFR has been calculated using the CKD EPI equation. This calculation has not been validated in all clinical situations. eGFR's persistently <60 mL/min signify possible Chronic Kidney Disease.  Anion gap 10 5 - 15  Glucose, capillary     Status: None   Collection Time: 11/18/15  6:15 AM  Result Value Ref Range   Glucose-Capillary 93 65 - 99 mg/dL   Comment 1 Notify RN    Comment 2 Document in Chart     Current Facility-Administered Medications  Medication Dose Route Frequency Provider Last Rate Last Dose  . 0.9 %  sodium chloride infusion  250 mL Intravenous PRN Jacolyn Reedy, MD      . acetaminophen (TYLENOL) tablet 650 mg  650 mg Oral Q4H PRN Jacolyn Reedy, MD   650 mg at 11/17/15 0757  . carvedilol (COREG) tablet 3.125 mg  3.125 mg Oral BID WC Jacolyn Reedy, MD   3.125 mg at 11/17/15 1659  . enoxaparin (LOVENOX) injection 40 mg  40 mg Subcutaneous Q24H Jacolyn Reedy, MD   40 mg at 11/17/15 1812  . furosemide (LASIX) injection 80 mg  80 mg Intravenous Q8H Jacolyn Reedy, MD   80 mg at 11/18/15 0807  . insulin aspart (novoLOG) injection 0-15 Units  0-15 Units Subcutaneous TID WC Jacolyn Reedy, MD   3 Units at 11/17/15 1702  . ivabradine (CORLANOR) tablet 5 mg  5 mg Oral BID WC Jacolyn Reedy, MD      . ondansetron [facility]) injection 4 mg  4 mg Intravenous Q6H PRN Jacolyn Reedy, MD      . sacubitril-valsartan (ENTRESTO) 24-26 mg per tablet  1 tablet Oral BID Jacolyn Reedy, MD   1 tablet at 11/17/15 2359  . sodium chloride flush (NS) 0.9 % injection 3 mL  3 mL Intravenous Q12H Jacolyn Reedy, MD   3 mL at 11/18/15 0000  . sodium chloride flush (NS) 0.9 % injection 3 mL  3 mL Intravenous PRN Jacolyn Reedy, MD      . spironolactone (ALDACTONE) tablet 25 mg  25 mg Oral Daily Jacolyn Reedy, MD   25 mg at 11/17/15  0955  . traMADol (ULTRAM) tablet 50 mg  50 mg Oral Q6H PRN Jacolyn Reedy, MD   50 mg at 11/17/15 1004    Musculoskeletal: Strength & Muscle Tone: decreased Gait & Station: unable to stand Patient leans: N/A  Psychiatric Specialty Exam: Physical Exam as per history and physical   ROS complaining about mild shortness of breath but no chest pain. No nausea or vomiting.  No Fever-chills, No Headache, No changes with Vision or hearing, reports vertigo No problems swallowing food or Liquids, No Chest pain, Cough or Shortness of Breath, No Abdominal pain, No Nausea or Vommitting, Bowel movements are regular, No Blood in stool or Urine, No dysuria, No new skin rashes or bruises, No new joints pains-aches,  No new weakness, tingling, numbness in any extremity, No recent weight gain or loss, No polyuria, polydypsia or polyphagia,   A full 10 point Review of Systems was done, except as stated above, all other Review of Systems were negative.  Blood pressure 104/70, pulse 88, temperature 97.5 F (36.4 C), temperature source Oral, resp. rate 18, height '5\' 6"'$  (1.676 m), weight 85.4 kg (188 lb 3.2 oz), SpO2 98 %.Body mass index is 30.38 kg/m.  General Appearance: Casual  Eye Contact:  Good  Speech:  Clear and Coherent  Volume:  Normal  Mood:  Euthymic  Affect:  Appropriate and Congruent  Thought Process:  Coherent and Goal Directed  Orientation:  Negative  Thought Content:  WDL  Suicidal Thoughts:  No  Homicidal Thoughts:  No  Memory:  Immediate;   Good Recent;   Poor Remote;   Good  Judgement:  Intact  Insight:  Good  Psychomotor Activity:  Decreased  Concentration:  Concentration: Fair and Attention Span: Fair  Recall:  [employer] of Knowledge:  Good  Language:  Good  Akathisia:  Negative  Handed:  Right  AIMS (if indicated):     Assets:  Communication Skills Desire for Improvement Financial Resources/Insurance Housing Leisure Time Resilience Social  Support Talents/Skills Transportation  ADL's:  Intact  Cognition:  Impaired,  Mild  Sleep:        Treatment Plan Summary: Patient has been suffering with the patient related memory deficits and admitted to the [facility] for shortness of breath and sore leg and sore back. Patient endorses difficulty with remembering the names and recall after a few minutes. Patient denies symptoms of depression, mania, psychosis and has no suicidal or homicidal ideation.  Based on my evaluation patient does meet criteria for capacity to make his own medical decisions and living arrangements  Appreciate psychiatric consultation and we sign off as of today Please contact [phone] or [phone] if needs further assistance   Disposition: Patient has mild is related delayed memory deficits but does not meet criteria for dementia Supportive therapy provided about ongoing stressors.  Ambrose Finland, MD 11/18/2015 9:36 AM

## 2015-11-18 NOTE — Progress Notes (Signed)
Call placed to attending. Alerted attending of blood pressure reading.  Will hold lasix at this time.  Pt denies any distress, just states he is sleepy.  Pt was napping before blood pressure taken.  Will cont to closely monitor.

## 2015-11-18 NOTE — Telephone Encounter (Signed)
I called Mrs. Kosinski yesterday and Dr. Wynonia Lawman. He has agreed to request consultation from behavioral health for evaluation of patient's capacity to take care of himself.

## 2015-11-18 NOTE — Progress Notes (Signed)
Subjective:  Back pain is better today.  Continues with significant diuresis.  Weight by scales down 18 pounds.  Less shortness of breath.  A behavioral health evaluation is in progress.  I spoke to Dr. Nyoka Cowden yesterday about the issues at home behaviorally that has been a real problem.  See telephone note in chart.  Objective:  Vital Signs in the last 24 hours: BP 104/70   Pulse 88   Temp 97.5 F (36.4 C) (Oral)   Resp 18   Ht 5\' 6"  (1.676 m)   Wt 85.4 kg (188 lb 3.2 oz)   SpO2 98%   BMI 30.38 kg/m   Physical Exam: Elderly white male currently in no acute distress Lungs:  Mild crackles at the base  Cardiac:  Regular rhythm, normal S1 and S2, no S3, 1 to 2/6 systolic murmur across the aortic valve, JV D still elevated Abdomen:  Soft, nontender, no masses Extremities:  1+ edema present  Intake/Output from previous day: 08/17 0701 - 08/18 0700 In: 240 [P.O.:240] Out: 3425 [Urine:3425] Weight Filed Weights   11/16/15 1056 11/17/15 0453 11/18/15 0503  Weight: 93.8 kg (206 lb 12.8 oz) 88.7 kg (195 lb 8 oz) 85.4 kg (188 lb 3.2 oz)    Lab Results: Basic Metabolic Panel:  Recent Labs  11/17/15 0036 11/18/15 0220  NA 139 139  K 4.2 4.0  CL 97* 97*  CO2 36* 32  GLUCOSE 104* 76  BUN 30* 28*  CREATININE 1.75* 1.55*   CBC:  Recent Labs  11/16/15 1204  WBC 7.1  NEUTROABS 5.3  HGB 11.5*  HCT 38.3*  MCV 91.0  PLT 145*   BNP    Component Value Date/Time   BNP 4,007.6 (H) 07/20/2015 0910   BNP 3,397.5 (H) 03/07/2015 2030   Telemetry: Sinus rhythm, There appears to be an IV conduction delay present this morning.  Assessment/Plan:  1.  Acute on chronic systolic congestive heart failure decompensated likely due to ibuprofen as well as dementia with some medical noncompliance at home-currently diuresing and improving  2.  Significant dementia affecting his situation 3.  Coronary artery disease 4.  Stage III chronic kidney disease 5.  Diabetes  mellitus  RECOMMENDATIONS:  Wound care to see.  Edema is down significantly.  I requested a behavioral health evaluation.  Recheck EKG.  Social work thinks he might need to go to short-term skilled nursing on discharge.  The plan will be to continue diuresis over the weekend, keep him in the hospital over the weekend with disposition sometime next week.  Continue intravenous diuresis and follow renal parameters.   Jimmy Hough  MD [facility]  11/18/2015, 8:52 AM

## 2015-11-18 NOTE — Consult Note (Signed)
[place] Nurse wound consult note Reason for Consult: Management of pressure sores  Wound type: Left shin partial thickness along the bone.  Patient unsure of what happened, but agreed it was likely the result of a shin scrape.  Measures 0.8 cm x 0.3cm with yellow moist wound bed.  Surrounding tissue intact.  Patient's granddaughter expressed concern that area may become infected and worsened since the patient is a diabetic.  No current s/s of infection to this site. Surrounding tissue intact.  Allevyn foam dressing in place.  Recommendation:  Apply betadine to site twice daily.  Cover with dressing.  If scab develops, do not pick at it, allow it to resolve and lift off naturally.  Buttocks evaluated.  Sacral foam dressing in place.  Dressing partially removed to evaluate buttocks/sacrum.  Skin appears intact, blanches.    Recommendation:  Use protective ointment to buttocks with each incontinent episode.  Continue to use Sacral foam dressing while in hospital.   Discussed POC with patient and granddaughter.  RN in Progression meeting.  Re consult if needed, will not follow at this time.  Thanks  Val Riles MSN, RN, CNS-BC, [employer]

## 2015-11-19 DIAGNOSIS — I5023 Acute on chronic systolic (congestive) heart failure: Secondary | ICD-10-CM

## 2015-11-19 LAB — GLUCOSE, CAPILLARY
GLUCOSE-CAPILLARY: 134 mg/dL — AB (ref 65–99)
GLUCOSE-CAPILLARY: 186 mg/dL — AB (ref 65–99)
GLUCOSE-CAPILLARY: 121 mg/dL — AB (ref 65–99)
Glucose-Capillary: 103 mg/dL — ABNORMAL HIGH (ref 65–99)

## 2015-11-19 LAB — BASIC METABOLIC PANEL
Anion gap: 10 (ref 5–15)
BUN: 32 mg/dL — AB (ref 6–20)
CHLORIDE: 95 mmol/L — AB (ref 101–111)
CO2: 33 mmol/L — AB (ref 22–32)
CREATININE: 1.41 mg/dL — AB (ref 0.61–1.24)
Calcium: 8.7 mg/dL — ABNORMAL LOW (ref 8.9–10.3)
GFR calc Af Amer: 51 mL/min — ABNORMAL LOW (ref >60)
GFR calc non Af Amer: 44 mL/min — ABNORMAL LOW (ref >60)
Glucose, Bld: 126 mg/dL — ABNORMAL HIGH (ref 65–99)
Potassium: 3.9 mmol/L (ref 3.5–5.1)
Sodium: 138 mmol/L (ref 135–145)

## 2015-11-19 NOTE — Progress Notes (Signed)
Bedside handoff communication to Sammy Martinez, Therapist, sports.

## 2015-11-19 NOTE — NC FL2 (Signed)
[place] MEDICAID FL2 LEVEL OF CARE SCREENING TOOL     IDENTIFICATION  Patient Name: Jimmy Dawson Birthdate: 1929/02/14 Sex: male Admission Date (Current Location): 11/16/2015  [facility] and [place] Number:  [employer] and Address:  [facility] [place], [place] [address], [place], [place] [address]      Provider Number: [id number]  Attending Physician Name and Address:  Jacolyn Reedy, MD  Relative Name and Phone Number:       Current Level of Care: Hospital Recommended Level of Care: [place] Prior Approval Number:    Date Approved/Denied:   PASRR Number: [id number]  Discharge Plan: SNF    Current Diagnoses: Patient Active Problem List   Diagnosis Date Noted  . Pressure ulcer 11/18/2015  . Acute on chronic systolic congestive heart failure ([place]) 11/16/2015  . Acute on chronic systolic (congestive) heart failure ([place]) 11/16/2015  . Urine incontinence 10/19/2015  . Hearing loss 07/20/2015  . Essential hypertension   . BCC (basal cell carcinoma), face 10/06/2014  . Actinic keratosis 10/06/2014  . Chronic systolic heart failure ([place]) 04/13/2014  . Aortic valve disease   . CAD (coronary artery disease)   . Hypertensive heart disease   . Cerebrovascular disease 10/22/2012  . DM (diabetes mellitus), type 2 with renal complications ([place]) [id number]  . Stage III chronic kidney disease   . Hyperlipidemia 06/17/2006  . BPH (benign prostatic hypertrophy) 11/13/2004  . PSA elevation 11/13/2004  . Reflux esophagitis 02/29/2004  . Mild memory disturbance 11/02/2002  . Unspecified sleep apnea 05/06/2002    Orientation RESPIRATION BLADDER Height & Weight     Self, Time, Place  Normal Indwelling catheter Weight: 185 lb 3 oz (84 kg) Height:  5\' 6"  (167.6 cm)  BEHAVIORAL SYMPTOMS/MOOD NEUROLOGICAL BOWEL NUTRITION STATUS      Continent Diet (Heart Healthy / Carb Modified / Thin Liquids)  AMBULATORY STATUS COMMUNICATION OF NEEDS  Skin   Extensive Assist Verbally PU Stage and Appropriate Care   PU Stage 2 Dressing:  (PRN foam dressing)                   Personal Care Assistance Level of Assistance  Bathing, Feeding, Dressing Bathing Assistance: Limited assistance Feeding assistance: Independent Dressing Assistance: Limited assistance     Functional Limitations Info  Sight, Hearing, Speech Sight Info: Adequate Hearing Info: Adequate Speech Info: Adequate    SPECIAL CARE FACTORS FREQUENCY  PT (By licensed PT), OT (By licensed OT)     PT Frequency: 3 OT Frequency: 3            Contractures Contractures Info: Present    Additional Factors Info  Code Status, Allergies, Insulin Sliding Scale Code Status Info: Full Code Allergies Info: No Known Allergies   Insulin Sliding Scale Info: Novolog 3 times daily with meals       Current Medications (11/19/2015):  This is the current hospital active medication list Current Facility-Administered Medications  Medication Dose Route Frequency Provider Last Rate Last Dose  . 0.9 %  sodium chloride infusion  250 mL Intravenous PRN Jacolyn Reedy, MD      . acetaminophen (TYLENOL) tablet 650 mg  650 mg Oral Q4H PRN Jacolyn Reedy, MD   650 mg at 11/17/15 0757  . antiseptic oral rinse (CPC / CETYLPYRIDINIUM CHLORIDE 0.05%) solution 7 mL  7 mL Mouth Rinse BID Jacolyn Reedy, MD   7 mL at 11/18/15 2200  . carvedilol (COREG) tablet 3.125 mg  3.125 mg Oral BID WC Jacolyn Reedy, MD   3.125 mg at 11/19/15 0641  . enoxaparin (LOVENOX) injection 40 mg  40 mg Subcutaneous Q24H Jacolyn Reedy, MD   40 mg at 11/18/15 1717  . furosemide (LASIX) injection 80 mg  80 mg Intravenous Q8H Jacolyn Reedy, MD   80 mg at 11/19/15 [phone]  . insulin aspart (novoLOG) injection 0-15 Units  0-15 Units Subcutaneous TID WC Jacolyn Reedy, MD   3 Units at 11/18/15 1653  . ivabradine (CORLANOR) tablet 5 mg  5 mg Oral BID WC Jacolyn Reedy, MD   5 mg at 11/19/15 [id number]  .  ondansetron (ZOFRAN) injection 4 mg  4 mg Intravenous Q6H PRN Jacolyn Reedy, MD      . povidone-iodine (BETADINE) 10 % external solution   Topical BID PRN Jacolyn Reedy, MD      . sacubitril-valsartan (ENTRESTO) 24-26 mg per tablet  1 tablet Oral BID Jacolyn Reedy, MD   1 tablet at 11/19/15 1040  . sodium chloride flush (NS) 0.9 % injection 3 mL  3 mL Intravenous Q12H Jacolyn Reedy, MD   3 mL at 11/18/15 2200  . sodium chloride flush (NS) 0.9 % injection 3 mL  3 mL Intravenous PRN Jacolyn Reedy, MD      . spironolactone (ALDACTONE) tablet 25 mg  25 mg Oral Daily Jacolyn Reedy, MD   25 mg at 11/19/15 1040  . traMADol (ULTRAM) tablet 50 mg  50 mg Oral Q6H PRN Jacolyn Reedy, MD   50 mg at 11/17/15 1004     Discharge Medications: Please see discharge summary for a list of discharge medications.  Relevant Imaging Results:  Relevant Lab Results:   Additional Information SSN [id number]    Barbette Or, [place]

## 2015-11-19 NOTE — Progress Notes (Signed)
Patient experienced low blood pressure 80/47 and 78/46 immediately after ambulation. Patient's family states this has been happening with the new lasix and blood pressure medications. Reassessed blood pressure one hour after ambulation was 90/57.

## 2015-11-19 NOTE — Progress Notes (Signed)
Cardiology paged about patients blood pressure being 80/46. Awaiting response.

## 2015-11-19 NOTE — Progress Notes (Signed)
Primary cardiologist: Dr. Landry Corporal  Seen for followup: Congestive heart failure  Subjective:    No complaint of chest pain. Has had some recent coughing.  Objective:   Temp:  [98 F (36.7 C)-98.2 F (36.8 C)] 98 F (36.7 C) (08/19 0454) Pulse Rate:  [74-105] 77 (08/19 0654) Resp:  [18-24] 18 (08/19 0454) BP: (68-118)/(36-77) 106/68 (08/19 0654) SpO2:  [96 %-100 %] 99 % (08/19 0454) Weight:  [185 lb 3 oz (84 kg)] 185 lb 3 oz (84 kg) (08/19 0454) Last BM Date: 11/17/15  Filed Weights   11/17/15 0453 11/18/15 0503 11/19/15 0454  Weight: 195 lb 8 oz (88.7 kg) 188 lb 3.2 oz (85.4 kg) 185 lb 3 oz (84 kg)    Intake/Output Summary (Last 24 hours) at 11/19/15 1142 Last data filed at 11/19/15 0900  Gross per 24 hour  Intake              303 ml  Output             2700 ml  Net            -2397 ml    Telemetry: Sinus rhythm with brief episode NSVT.  Exam:  General: Elderly male, no distress.  Lungs: Upper airway congestion.  Cardiac: RRR with soft systolic murmur at base, distant heart sounds.  Abdomen: NABS.  Extremities: 2+ pitting edema.  Lab Results:  Basic Metabolic Panel:  Recent Labs Lab 11/17/15 0036 11/18/15 0220 11/19/15 0249  NA 139 139 138  K 4.2 4.0 3.9  CL 97* 97* 95*  CO2 36* 32 33*  GLUCOSE 104* 76 126*  BUN 30* 28* 32*  CREATININE 1.75* 1.55* 1.41*  CALCIUM 8.9 8.6* 8.7*  MG 1.7  --   --     Liver Function Tests:  Recent Labs Lab 11/16/15 1204  AST 33  ALT 31  ALKPHOS 62  BILITOT 0.8  PROT 6.1*  ALBUMIN 3.6    CBC:  Recent Labs Lab 11/16/15 1204  WBC 7.1  HGB 11.5*  HCT 38.3*  MCV 91.0  PLT 145*    Echocardiogram 11/17/2015: Study Conclusions  - Left ventricle: The cavity size was mildly dilated. Wall   thickness was increased in a pattern of moderate LVH. Systolic   function was normal. The estimated ejection fraction was in the   range of 15% to 20%. Severe diffuse hypokinesis. - Aortic valve: Cusp  separation was reduced. Valve mobility was   moderately restricted. There was moderate to severe stenosis.   Valve area (VTI): 0.85 cm^2. Valve area (Vmax): 0.88 cm^2. Valve   area (Vmean): 0.94 cm^2. - Mitral valve: Mildly calcified annulus. Moderately calcified   leaflets . - Left atrium: The atrium was severely dilated. - Right ventricle: The cavity size was mildly dilated. Wall   thickness was normal. - Right atrium: The atrium was moderately to severely dilated. - Tricuspid valve: There was mild-moderate regurgitation. - Pulmonary arteries: Systolic pressure was moderately increased.   PA peak pressure: 39 mm Hg (S).  ECG: Tracing from 11/18/2015 showed sinus rhythm with left atrial enlargement, leftward axis, and poor R wave progression.   Medications:   Scheduled Medications: . antiseptic oral rinse  7 mL Mouth Rinse BID  . carvedilol  3.125 mg Oral BID WC  . enoxaparin (LOVENOX) injection  40 mg Subcutaneous Q24H  . furosemide  80 mg Intravenous Q8H  . insulin aspart  0-15 Units Subcutaneous TID WC  . ivabradine  5 mg  Oral BID WC  . sacubitril-valsartan  1 tablet Oral BID  . sodium chloride flush  3 mL Intravenous Q12H  . spironolactone  25 mg Oral Daily     PRN Medications:  sodium chloride, acetaminophen, ondansetron (ZOFRAN) IV, povidone-iodine, sodium chloride flush, traMADol   Assessment:   1. Acute on chronic systolic heart failure with LVEF 15-20% and diffuse hypokinesis. He continues on IV Lasix with substantial diuresis during hospitalization. Approximately 2200 cc out more than last 24 hours.  2. Moderate to severe aortic valve stenosis by recent echocardiogram.  3. Coronary artery disease, moderate left main and LAD disease by cardiac catheterization in February 2016.  4. CKD stage III.  5. Question of dementia, patient seen by [facility] yesterday and felt to have the capacity to make his own medical decisions and living  arrangements.   Plan/Discussion:    I reviewed Dr. Thurman Coyer most recent note and plan. We will continue diuresis through the weekend. Dose of Lasix held yesterday due to low blood pressure, systolic between [id number] and [id number] today. Cardiac regimen includes Coreg, Corlanor, Entresto, and Aldactone. Continue Lasix at current dose, watch intake and output. Creatinine is actually trending down. If blood pressure remains on low side may need to cut back Lasix dosing.   Satira Sark, M.D., F.A.C.C.

## 2015-11-20 LAB — BASIC METABOLIC PANEL
Anion gap: 9 (ref 5–15)
BUN: 32 mg/dL — AB (ref 6–20)
CHLORIDE: 93 mmol/L — AB (ref 101–111)
CO2: 33 mmol/L — AB (ref 22–32)
CREATININE: 1.54 mg/dL — AB (ref 0.61–1.24)
Calcium: 8.5 mg/dL — ABNORMAL LOW (ref 8.9–10.3)
GFR calc Af Amer: 46 mL/min — ABNORMAL LOW (ref >60)
GFR calc non Af Amer: 39 mL/min — ABNORMAL LOW (ref >60)
Glucose, Bld: 184 mg/dL — ABNORMAL HIGH (ref 65–99)
POTASSIUM: 3.9 mmol/L (ref 3.5–5.1)
SODIUM: 135 mmol/L (ref 135–145)

## 2015-11-20 LAB — GLUCOSE, CAPILLARY
GLUCOSE-CAPILLARY: 109 mg/dL — AB (ref 65–99)
GLUCOSE-CAPILLARY: 197 mg/dL — AB (ref 65–99)
GLUCOSE-CAPILLARY: 184 mg/dL — AB (ref 65–99)
Glucose-Capillary: 130 mg/dL — ABNORMAL HIGH (ref 65–99)

## 2015-11-20 MED ORDER — FUROSEMIDE 10 MG/ML IJ SOLN
60.0000 mg | Freq: Two times a day (BID) | INTRAMUSCULAR | Status: DC
  Administered 2015-11-20 – 2015-11-21 (×2): 60 mg via INTRAVENOUS
  Filled 2015-11-20 (×2): qty 6

## 2015-11-20 NOTE — Clinical Social Work Note (Signed)
Clinical Social Work Assessment  Patient Details  Name: Jimmy Dawson MRN: [id number] Date of Birth: [birth year]  Date of referral:  11/20/15               Reason for consult:  Facility Placement                Permission sought to share information with:  Family Supports Permission granted to share information::  Yes, Verbal Permission Granted  Name::     Kamau Weatherall  Relationship::  Spouse  Contact Information:  [phone]  Housing/Transportation Living arrangements for the past 2 months:  Single Family Home Source of Information:  Patient, Other (Comment Required) (Granddaughter) Patient Interpreter Needed:  None Criminal Activity/Legal Involvement Pertinent to Current Situation/Hospitalization:  No - Comment as needed Significant Relationships:  Spouse, Other Family Members Lives with:  Spouse Do you feel safe going back to the place where you live?  Yes Need for family participation in patient care:  Yes (Comment)  Care giving concerns:  Patient granddaughter at bedside and she states that patient cannot safely manage at home with his wife.  Patient wife is not able to care for patient needs in current state and will need assistance.   Social Worker assessment / plan:  [place] representative met with patient and patient granddaughter at bedside to offer support and discuss patient needs at discharge.  Patient states that he lives at home with his wife and they assist once another with their current needs.  Patient originally stated that he planned to go home with his wife and would not entertain the idea of SNF placement.  With continued conversation and some slight convincing, patient is now agreeable to ST-SNF placement.  Patient inquired about the possibility of [place] since it would be closer to home - CSW to initiate search in [place] and [place] to encompass all potential placement options.  CSW to follow up with available bed offers.  CSW remains available for support and  to facilitate patient discharge needs once medically stable.  Employment status:  Retired Forensic scientist:  Medicare PT Recommendations:  [place] / Referral to community resources:  [place]  Patient/Family's Response to care:  Patient was initially very hostile and defensive, however once it was established that this would be short term placement and to the benefit of him, he became agreeable.  Patient main focus is on regaining strength to return home as quickly as possible.  Patient understanding of CSW role and appreciative of support.  Patient/Family's Understanding of and Emotional Response to Diagnosis, Current Treatment, and Prognosis:  Patient and granddaughter verbalize understanding of patient limitation and barriers at this time.  Patient still with reservation and a slight unrealistic approach for discharge, but at this moment remains agreeable with SNF placement.  Emotional Assessment Appearance:  Appears stated age Attitude/Demeanor/Rapport:  Guarded, Hostile Affect (typically observed):  Appropriate, Guarded, Frustrated, Restless Orientation:  Oriented to Self, Oriented to Place, Oriented to  Time, Oriented to Situation Alcohol / Substance use:  Not Applicable Psych involvement (Current and /or in the community):  No (Comment)  Discharge Needs  Concerns to be addressed:  Discharge Planning Concerns Readmission within the last 30 days:  No Current discharge risk:  None Barriers to Discharge:  Continued Medical Work up  [employer], [place]

## 2015-11-20 NOTE — Progress Notes (Signed)
Primary cardiologist: Dr. Landry Corporal  Seen for followup: Congestive heart failure  Subjective:    Feels somewhat better. No chest pain.  Objective:   Temp:  [97.4 F (36.3 C)-98.4 F (36.9 C)] 97.4 F (36.3 C) (08/20 0434) Pulse Rate:  [68-82] 81 (08/20 0434) Resp:  [16] 16 (08/20 0434) BP: (80-101)/(48-65) 101/65 (08/20 0434) SpO2:  [99 %-100 %] 100 % (08/20 0434) Weight:  [182 lb 6.4 oz (82.7 kg)] 182 lb 6.4 oz (82.7 kg) (08/20 0434) Last BM Date: 11/17/15  Filed Weights   11/18/15 0503 11/19/15 0454 11/20/15 0434  Weight: 188 lb 3.2 oz (85.4 kg) 185 lb 3 oz (84 kg) 182 lb 6.4 oz (82.7 kg)    Intake/Output Summary (Last 24 hours) at 11/20/15 1209 Last data filed at 11/20/15 0900  Gross per 24 hour  Intake              350 ml  Output             4250 ml  Net            -3900 ml    Telemetry: Sinus rhythm with brief episodes NSVT.  Exam:  General: Elderly male, no distress.  Lungs: Upper airway congestion.  Cardiac: RRR with soft systolic murmur at base, distant heart sounds.  Abdomen: NABS.  Extremities: 2+ pitting edema.  Lab Results:  Basic Metabolic Panel:  Recent Labs Lab 11/17/15 0036 11/18/15 0220 11/19/15 0249 11/20/15 0304  NA 139 139 138 135  K 4.2 4.0 3.9 3.9  CL 97* 97* 95* 93*  CO2 36* 32 33* 33*  GLUCOSE 104* 76 126* 184*  BUN 30* 28* 32* 32*  CREATININE 1.75* 1.55* 1.41* 1.54*  CALCIUM 8.9 8.6* 8.7* 8.5*  MG 1.7  --   --   --     Liver Function Tests:  Recent Labs Lab 11/16/15 1204  AST 33  ALT 31  ALKPHOS 62  BILITOT 0.8  PROT 6.1*  ALBUMIN 3.6    CBC:  Recent Labs Lab 11/16/15 1204  WBC 7.1  HGB 11.5*  HCT 38.3*  MCV 91.0  PLT 145*    Echocardiogram 11/17/2015: Study Conclusions  - Left ventricle: The cavity size was mildly dilated. Wall   thickness was increased in a pattern of moderate LVH. Systolic   function was normal. The estimated ejection fraction was in the   range of 15% to 20%.  Severe diffuse hypokinesis. - Aortic valve: Cusp separation was reduced. Valve mobility was   moderately restricted. There was moderate to severe stenosis.   Valve area (VTI): 0.85 cm^2. Valve area (Vmax): 0.88 cm^2. Valve   area (Vmean): 0.94 cm^2. - Mitral valve: Mildly calcified annulus. Moderately calcified   leaflets . - Left atrium: The atrium was severely dilated. - Right ventricle: The cavity size was mildly dilated. Wall   thickness was normal. - Right atrium: The atrium was moderately to severely dilated. - Tricuspid valve: There was mild-moderate regurgitation. - Pulmonary arteries: Systolic pressure was moderately increased.   PA peak pressure: 39 mm Hg (S).  ECG: Tracing from 11/18/2015 showed sinus rhythm with left atrial enlargement, leftward axis, and poor R wave progression.   Medications:   Scheduled Medications: . antiseptic oral rinse  7 mL Mouth Rinse BID  . carvedilol  3.125 mg Oral BID WC  . enoxaparin (LOVENOX) injection  40 mg Subcutaneous Q24H  . furosemide  80 mg Intravenous Q8H  . insulin aspart  0-15 Units  Subcutaneous TID WC  . ivabradine  5 mg Oral BID WC  . sacubitril-valsartan  1 tablet Oral BID  . sodium chloride flush  3 mL Intravenous Q12H  . spironolactone  25 mg Oral Daily    PRN Medications: sodium chloride, acetaminophen, ondansetron (ZOFRAN) IV, povidone-iodine, sodium chloride flush, traMADol   Assessment:   1. Acute on chronic systolic heart failure with LVEF 15-20% and diffuse hypokinesis. He continues on IV Lasix with net diuresis 2400 cc last 24 hours. Blood pressures have been low recently.  2. Moderate to severe aortic valve stenosis by recent echocardiogram.  3. Coronary artery disease, moderate left main and LAD disease by cardiac catheterization in February 2016.  4. CKD stage III. Creatinine 1.4-1.5.  5. Question of dementia, patient seen by [facility] yesterday and felt to have the capacity to make his own medical  decisions and living arrangements.   Plan/Discussion:    Continue Coreg, Corlanor, Entresto, and Aldactone. Will cut Lasix back to 60 mg IV twice a day. Follow-up BMET a.m.   Satira Sark, M.D., F.A.C.C.

## 2015-11-20 NOTE — Clinical Social Work Placement (Signed)
   CLINICAL SOCIAL WORK PLACEMENT  NOTE  Date:  11/20/2015  Patient Details  Name: SHAYNE HOULT MRN: [id number] Date of Birth: 11/23/1928  Clinical Social Work is seeking post-discharge placement for this patient at the [place] level of care (*CSW will initial, date and re-position this form in  chart as items are completed):  Yes   Patient/family provided with [place] Work Department's list of facilities offering this level of care within the geographic area requested by the patient (or if unable, by the patient's family).  Yes   Patient/family informed of their freedom to choose among providers that offer the needed level of care, that participate in Medicare, Medicaid or managed care program needed by the patient, have an available bed and are willing to accept the patient.  Yes   Patient/family informed of [facility]'s ownership interest in [facility] and [facility], as well as of the fact that they are under no obligation to receive care at these facilities.  PASRR submitted to EDS on 11/20/15     PASRR number received on 11/20/15     Existing PASRR number confirmed on       FL2 transmitted to all facilities in geographic area requested by pt/family on 11/20/15     FL2 transmitted to all facilities within larger geographic area on       Patient informed that his/her managed care company has contracts with or will negotiate with certain facilities, including the following:            Patient/family informed of bed offers received.  Patient chooses bed at       Physician recommends and patient chooses bed at      Patient to be transferred to   on  .  Patient to be transferred to facility by       Patient family notified on   of transfer.  Name of family member notified:        PHYSICIAN Please sign FL2     Additional Comment:    Barbette Or, [place]

## 2015-11-21 ENCOUNTER — Other Ambulatory Visit: Payer: Federal, State, Local not specified - PPO

## 2015-11-21 LAB — GLUCOSE, CAPILLARY
GLUCOSE-CAPILLARY: 129 mg/dL — AB (ref 65–99)
Glucose-Capillary: 131 mg/dL — ABNORMAL HIGH (ref 65–99)
Glucose-Capillary: 133 mg/dL — ABNORMAL HIGH (ref 65–99)
Glucose-Capillary: 124 mg/dL — ABNORMAL HIGH (ref 65–99)

## 2015-11-21 MED ORDER — FUROSEMIDE 40 MG PO TABS
40.0000 mg | ORAL_TABLET | Freq: Two times a day (BID) | ORAL | Status: DC
  Administered 2015-11-21 – 2015-11-23 (×3): 40 mg via ORAL
  Filled 2015-11-21 (×4): qty 1

## 2015-11-21 NOTE — Progress Notes (Signed)
Spoke with Dr. Wynonia Lawman regarding patient's low blood pressure (84,44 - 76/48 - 96/58). Dr. Wynonia Lawman aware and okay with administration of Entresto and Aldactone. Pt has granddaughter at bedside, call light within reach.   Fritz Pickerel, RN

## 2015-11-21 NOTE — Progress Notes (Signed)
Subjective:  Is sitting up in the chair at the side of the bed.  He was deemed competent by the psychiatrist on Friday to make his own decisions.  He is agreeable to going to SNF/rehabilitation for a short time.  He would like to be closer to his home.  Breathing has improved.  He has diuresed 30 pounds since admission.  No chest pain.  Objective:  Vital Signs in the last 24 hours: BP 102/62 (BP Location: Right Arm)   Pulse 76   Temp 97.6 F (36.4 C) (Oral)   Resp 17   Ht 5\' 6"  (1.676 m)   Wt 81 kg (178 lb 8 oz)   SpO2 91%   BMI 28.81 kg/m   Physical Exam: Elderly white male currently in no acute distress Lungs:  Mild crackles at the base  Cardiac:  Regular rhythm, normal S1 and S2, no S3, 1 to 2/6 systolic murmur across the aortic valve, JV D still elevated Abdomen:  Soft, nontender, no masses Extremities:  No edema present  Intake/Output from previous day: 08/20 0701 - 08/21 0700 In: 350 [P.O.:350] Out: 3550 [Urine:3550] Weight Filed Weights   11/19/15 0454 11/20/15 0434 11/21/15 0500  Weight: 84 kg (185 lb 3 oz) 82.7 kg (182 lb 6.4 oz) 81 kg (178 lb 8 oz)    Lab Results: Basic Metabolic Panel:  Recent Labs  11/19/15 0249 11/20/15 0304  NA 138 135  K 3.9 3.9  CL 95* 93*  CO2 33* 33*  GLUCOSE 126* 184*  BUN 32* 32*  CREATININE 1.41* 1.54*   BNP    Component Value Date/Time   BNP 4,007.6 (H) 07/20/2015 0910   BNP 3,397.5 (H) 03/07/2015 2030   Telemetry: Sinus rhythm,   Assessment/Plan:  1.  Acute on chronic systolic congestive heart failure decompensated likely due to ibuprofen as well as dementia with some medical noncompliance at home-He is currently at his dry weight 2.  Intermittent memory loss but deemed competent to manage his own affairs 3.  Coronary artery disease 4.  Stage III chronic kidney disease 5.  Diabetes mellitus currently controlled  RECOMMENDATIONS:  Looking for bed at SNF/rehabilitation.  I would like to keep him this evening to be  sure blood pressure remained stable overnight.  Change back to oral Lasix this evening.    Kerry Hough  MD [facility] Cardiology  11/21/2015, 8:45 AM

## 2015-11-21 NOTE — Clinical Social Work Note (Signed)
CSW met with pt and granddaughter to provide bed offers. CSW also spoke to pt's wife on the phone at bedside. Current bed offers are too far for pt's wife, therefore CSW expanded search. CSW will also call [facility] to inquire about a potential bed offer, as it is pt's wife first choice. CSW will continue to follow.   Darden Dates, MSW, LCSW Clinical Social Worker [phone]

## 2015-11-21 NOTE — Care Management Important Message (Signed)
Important Message  Patient Details  Name: Jimmy Dawson MRN: [id number] Date of Birth: October 08, 1928   Medicare Important Message Given:  Yes    Jene Huq Abena 11/21/2015, 11:07 AM

## 2015-11-22 LAB — GLUCOSE, CAPILLARY
GLUCOSE-CAPILLARY: 109 mg/dL — AB (ref 65–99)
GLUCOSE-CAPILLARY: 149 mg/dL — AB (ref 65–99)
GLUCOSE-CAPILLARY: 107 mg/dL — AB (ref 65–99)
Glucose-Capillary: 141 mg/dL — ABNORMAL HIGH (ref 65–99)

## 2015-11-22 NOTE — Progress Notes (Signed)
Notified on call for cardiology about pt's low b/p and whether to give or hold scheduled entresto. Per MD, hold entresto for tonight. Pt's b/p 91/61. Will continue to monitor

## 2015-11-22 NOTE — Progress Notes (Signed)
Physical Therapy Treatment Patient Details Name: Jimmy Dawson MRN: [id number] DOB: 1929-01-04 Today's Date: 12/21/2015    History of Present Illness 82 yo admitted with CHF exacerbation edema and dyspnea. PMHx: CHF, CAD, HLD, CKD, DM, GERD, dementia    PT Comments    Patient progressing with gait tolerance.  Still limited with safety awareness and continue to recommend SNF level rehab at d/c.  Will follow acutely.   Follow Up Recommendations  Supervision for mobility/OOB;SNF     Equipment Recommendations  None recommended by PT    Recommendations for Other Services       Precautions / Restrictions Precautions Precautions: Fall    Mobility  Bed Mobility               General bed mobility comments: sitting on EOB  Transfers Overall transfer level: Needs assistance Equipment used: Rolling walker (2 wheeled)   Sit to Stand: Min assist         General transfer comment: initially unsuccessful due to pulling up on walker, cues for hand placement and assist for safety  Ambulation/Gait Ambulation/Gait assistance: Min guard Ambulation Distance (Feet): 150 Feet Assistive device: Rolling walker (2 wheeled) Gait Pattern/deviations: Step-through pattern;Shuffle;Decreased stride length;Trunk flexed     General Gait Details: cues for posture, assist for safety   Stairs            Wheelchair Mobility    Modified Rankin (Stroke Patients Only)       Balance Overall balance assessment: Needs assistance   Sitting balance-Leahy Scale: Good       Standing balance-Leahy Scale: Poor Standing balance comment: can stand without UE support, but flexed posture, poor safety and deficit awareness, high fall risk                    Cognition Arousal/Alertness: Awake/alert Behavior During Therapy: WFL for tasks assessed/performed Overall Cognitive Status: History of cognitive impairments - at baseline                      Exercises General  Exercises - Lower Extremity Hip Flexion/Marching: Strengthening;Both;10 reps;Seated Toe Raises: Strengthening;Both;10 reps;Seated Heel Raises: Strengthening;Both;10 reps;Seated Other Exercises Other Exercises: sit<>stand x 5    General Comments        Pertinent Vitals/Pain Pain Assessment: No/denies pain    Home Living                      Prior Function            PT Goals (current goals can now be found in the care plan section) Progress towards PT goals: Progressing toward goals    Frequency  Min 3X/week    PT Plan Current plan remains appropriate    Co-evaluation             End of Session Equipment Utilized During Treatment: Gait belt Activity Tolerance: Patient tolerated treatment well Patient left: in bed;with family/visitor present;with call bell/phone within reach     Time: 1216-1230 PT Time Calculation (min) (ACUTE ONLY): 14 min  Charges:  $Gait Training: 8-22 mins                    G Codes:      Reginia Naas 12/21/2015, 12:53 PM  Magda Kiel, [place] 12/21/15

## 2015-11-22 NOTE — Progress Notes (Signed)
Called Dr. Wynonia Lawman to discontinue foley.  Orders received. Payton Emerald, RN

## 2015-11-22 NOTE — Progress Notes (Signed)
Subjective:  Awaiting bed placement at the present time.  Currently not short of breath although has periodic breathing at night.  No chest pain.  Objective:  Vital Signs in the last 24 hours: BP 100/63 (BP Location: Left Arm)   Pulse 71   Temp 98.2 F (36.8 C) (Axillary)   Resp 18   Ht 5\' 6"  (1.676 m)   Wt 81.8 kg (180 lb 4.8 oz)   SpO2 (!) 88%   BMI 29.10 kg/m   Physical Exam: Elderly white male currently in no acute distress Lungs:  Relatively clear Cardiac:  Regular rhythm, normal S1 and S2, no S3, 1 to 2/6 systolic murmur across the aortic valve, JV D flat  Abdomen:  Soft, nontender, no masses Extremities:  No edema present  Intake/Output from previous day: 08/21 0701 - 08/22 0700 In: 720 [P.O.:720] Out: 4000 [Urine:4000] Weight Filed Weights   11/20/15 0434 11/21/15 0500 11/22/15 0530  Weight: 82.7 kg (182 lb 6.4 oz) 81 kg (178 lb 8 oz) 81.8 kg (180 lb 4.8 oz)    Lab Results: Basic Metabolic Panel:  Recent Labs  11/20/15 0304  NA 135  K 3.9  CL 93*  CO2 33*  GLUCOSE 184*  BUN 32*  CREATININE 1.54*   BNP    Component Value Date/Time   BNP 4,007.6 (H) 07/20/2015 0910   BNP 3,397.5 (H) 03/07/2015 2030   Telemetry: Sinus rhythm,   Assessment/Plan:  1.  Acute on chronic systolic congestive heart failure decompensated likely due to ibuprofen as well as dementia with some medical noncompliance at home-He is currently at his dry weight and back on Lasix. 2.  Intermittent memory loss but deemed competent to manage his own affairs 3.  Coronary artery disease 4.  Stage III chronic kidney disease 5.  Diabetes mellitus currently controlled  RECOMMENDATIONS:  Blood pressure is borderline but appears to be compensated and tolerating this well and will continue current medicines.  Awaiting bed placement.  Long discussion today with patient and granddaughter regarding daily weights, management of heart failure and involvement of home health.    Kerry Hough  MD [facility] Cardiology  11/22/2015, 8:55 AM

## 2015-11-22 NOTE — Clinical Social Work Note (Signed)
CSW met with family regarding bed offers. Pt's wife first choice declined to make a bed offer. Family inquired about a bed at [facility], CSW sent referral. CSW encouraged pt's wife to make a second choice. Second choice will be [facility]. CSW will continue to follow.   Darden Dates, MSW, LCSW Clinical Social Worker  [phone]

## 2015-11-23 ENCOUNTER — Ambulatory Visit: Payer: Federal, State, Local not specified - PPO | Admitting: Internal Medicine

## 2015-11-23 LAB — GLUCOSE, CAPILLARY
GLUCOSE-CAPILLARY: 107 mg/dL — AB (ref 65–99)
GLUCOSE-CAPILLARY: 130 mg/dL — AB (ref 65–99)

## 2015-11-23 LAB — CREATININE, SERUM
Creatinine, Ser: 1.36 mg/dL — ABNORMAL HIGH (ref 0.61–1.24)
GFR, EST AFRICAN AMERICAN: 53 mL/min — AB (ref >60)
GFR, EST NON AFRICAN AMERICAN: 46 mL/min — AB (ref >60)

## 2015-11-23 MED ORDER — FUROSEMIDE 40 MG PO TABS: 40.0000 mg | ORAL_TABLET | Freq: Two times a day (BID) | ORAL | 12 refills | Status: DC

## 2015-11-23 MED ORDER — TRAMADOL HCL 50 MG PO TABS: 50.0000 mg | ORAL_TABLET | Freq: Four times a day (QID) | ORAL | 0 refills | Status: DC | PRN

## 2015-11-23 MED ORDER — IVABRADINE HCL 5 MG PO TABS: 5.0000 mg | ORAL_TABLET | Freq: Two times a day (BID) | ORAL | 12 refills | Status: DC

## 2015-11-23 NOTE — Care Management Note (Signed)
Case Management Note Marvetta Gibbons RN, BSN Unit 2W-Case Manager [phone]  Patient Details  Name: Jimmy Dawson MRN: [id number] Date of Birth: 1928-07-07  Subjective/Objective:   Pt admitted with acute on chronic HF                 Action/Plan: PTA pt lived at home with wife- spoke with wife and granddaughter at bedside- wife has concerns about being about to care for pt at home- states that pt has dementia and she can not really handle him at home- pt still drives although he is not suppose to, [facility] has been suggested in the past- however per wife-pt has refused- wife would like to look at [facility] rehab- PT eval has been ordered and is pending- not sure if pt would be agreeable- may need MD to weigh in on if pt has the capacity to make his own decisions- or if we need psych eval to assist with this-? CM and CSW to f/u on d/c plan after PT eval.   Expected Discharge Date:     11/23/15             Expected Discharge Plan:  Skilled Nursing Facility  In-House Referral:  Clinical Social Work  Discharge planning Services  CM Consult  Post Acute Care Choice:    Choice offered to:     DME Arranged:    DME Agency:     HH Arranged:    [employer] Agency:     Status of Service:  Completed, signed off  If discussed at [employer], dates discussed:    Additional Comments:  11/23/15- Marvetta Gibbons - pt for d/c to SNF today- per psych eval pt does have capacity to back decisions- has agreed to [facility]- CSW following for placement needs.  Dawayne Patricia, RN 11/23/2015, 11:59 AM ,

## 2015-11-23 NOTE — Progress Notes (Signed)
Subjective:  No complaints of shortness of breath or chest pain.  He is sitting up the side of the bed eating breakfast.  Still somewhat slow to comprehend instructions but could repeat instructions yesterday regarding need to weigh daily and not gained more than 5 pounds.  Granddaughter in room with patient.  Objective:  Vital Signs in the last 24 hours: BP (!) 94/59 (BP Location: Left Arm)   Pulse 90   Temp 97.6 F (36.4 C) (Oral)   Resp 20   Ht 5\' 6"  (1.676 m)   Wt 79.8 kg (175 lb 14.4 oz)   SpO2 97%   BMI 28.39 kg/m   Physical Exam: Elderly white male currently in no acute distress, sitting up at site of bed Lungs:  Minimal crackles present at both bases  Cardiac:  Regular rhythm, normal S1 and S2, no S3, 1 to 2/6 systolic murmur across the aortic valve, JV D flat Extremities:  No edema present  Intake/Output from previous day: 08/22 0701 - 08/23 0700 In: 720 [P.O.:720] Out: 1800 [Urine:1800] Weight Filed Weights   11/21/15 0500 11/22/15 0530 11/23/15 0429  Weight: 81 kg (178 lb 8 oz) 81.8 kg (180 lb 4.8 oz) 79.8 kg (175 lb 14.4 oz)    Lab Results: Basic Metabolic Panel:  Recent Labs  11/23/15 0514  CREATININE 1.36*   BNP    Component Value Date/Time   BNP 4,007.6 (H) 07/20/2015 0910   BNP 3,397.5 (H) 03/07/2015 2030   Telemetry: Sinus rhythm,   Assessment/Plan:  1.  Acute on chronic systolic congestive heart failure -Clinically he is now improved and is now at his dry weight. 2.  Intermittent memory loss but deemed competent to manage his own affairs-plans are for skilled nursing and we are currently awaiting bed placement 3.  Coronary artery disease-stable 4.  Stage III chronic kidney disease 5.  Diabetes mellitus currently controlled off of his medications 6.  Aortic valve disease  RECOMMENDATIONS:  We are currently awaiting bed placement from a skilled nursing facility.  He is currently stable on his current medical regimen.  Glucoses have been  controlled in the house and I have opted not to resume his diabetic medicine because we don't want to have hypoglycemia.  Foley catheter was removed yesterday.  Awaiting bed placement.    Kerry Hough  MD [facility] Cardiology  11/23/2015, 8:39 AM

## 2015-11-23 NOTE — Progress Notes (Signed)
Patient to D/C to [facility] with [facility]AR. Report called. D/C education done. IV removed. Tele monitor removed. CCMD notified. Granddaughter at bedside. Patient and granddaughter state no further questions. Domingo Dimes RN

## 2015-11-23 NOTE — Discharge Summary (Signed)
Physician Discharge Summary  Patient ID: Jimmy Dawson MRN: [id number] DOB/AGE: March 09, 1929 82 y.o.  Admit date: 11/16/2015 Discharge date: 11/23/2015  Primary Physician:  Dr. Elder Negus  Primary Discharge Diagnosis:  1.  Acute on chronic systolic congestive heart failure-improved  Secondary Discharge Diagnosis: 2.  Ischemic cardiomyopathy 3.  Aortic valve disease with possible low output aortic stenosis 4.  Stage III chronic kidney disease 5.  Type 2 diabetes mellitus non-insulin-dependent 6.  Mild memory disturbance but deemed competent to handle his own affairs by psychiatry 7. Pressure ulcer on sacrum and lower legs present on admission  Procedures:  2-D echocardiogram  Consults:  Psychiatry  Hospital Course: Patient seen early for evaluation of worsening edema shortness of breath. He has known chronic systolic heart failure with an ejection fraction of 20-25%. He has had some memory issues and things have been difficult at home with increasing combativeness. He has had a 30 pound weight gain since he was seen in the office 2 months ago. He has had worsening shortness of breath and his primary care doctor changed him to torsemide twice a day couple of weeks ago. His wife called Friday with increasing edema weight gain and was distraught over his combativeness and refusal to some medical care. He was not willing to go to the hospital. We have talked to the wife again on 2 occasions and she is insistent that the memory issues are a problem. Compliance with diuretics at home I think is good but he has had progressive weight gain and was admitted this time for intravenous diuresis as he has a 30 pound weight gain previously. He has no angina.  He has severe dyspnea with exertion and significant edema. He has some orthopnea. He is oriented to date but does note some short-term memory issues. Difficult to tell if he is restricting fluids at home or not.  The patient was admitted and underwent  intravenous diuresis.  His admission weight was 208 pounds and with intravenous diuresis he lost down to a dry weight of around 178 pounds for a 30 pound weight loss.  With this he significantly improved.  The nurses placed a Foley catheter early in the admission and this was removed prior to discharge.  Her cardiogram showed an ejection fraction of 15-20% with moderate to severe aortic stenosis.  Previously this had been evaluated previously and he did not have much gradient at catheterization.  He also had some fluctuating blood pressures.  We reinstituted his medicine and eventually were able to change him back to oral Lasix.  He was seen by psychiatry because of a question about what he was competent to handle his affairs.  The psychiatrist in temp, the hand was appears but did note some mild memory problems.  After discussion with the family was concerned over management at home as well as family dynamics.  It was thought that a short-term course of skilled nursing placement would benefit the patient  and a search was made for beds.  After discussion a decision was made to move him to [facility]. He was seen by PT who felt he would need this for ambulation safety.   When he is discharged from the nursing home he will need to have home health care set up to assure he is having compliance with his medicines.  The importance of weighing daily was also discussed with the patient.  Discharge Exam: Blood pressure (!) 94/59, pulse 90, temperature 97.6 F (36.4 C), temperature source  Oral, resp. rate 20, height 5\' 6"  (1.676 m), weight 79.8 kg (175 lb 14.4 oz), SpO2 97 %. Weight: 79.8 kg (175 lb 14.4 oz) Lungs with minimal rales, 1-2/6 mumur of aortic valve disease No edema.  Discharge weight 180.  Labs: CBC:   Lab Results  Component Value Date   WBC 7.1 11/16/2015   HGB 11.5 (L) 11/16/2015   HCT 38.3 (L) 11/16/2015   MCV 91.0 11/16/2015   PLT 145 (L) 11/16/2015    CMP:  Recent  Labs Lab 11/16/15 1204  11/20/15 0304 11/23/15 0514  NA 143  < > 135  --   K 3.6  < > 3.9  --   CL 103  < > 93*  --   CO2 32  < > 33*  --   BUN 32*  < > 32*  --   CREATININE 1.75*  < > 1.54* 1.36*  CALCIUM 8.9  < > 8.5*  --   PROT 6.1*  --   --   --   BILITOT 0.8  --   --   --   ALKPHOS 62  --   --   --   ALT 31  --   --   --   AST 33  --   --   --   GLUCOSE 101*  < > 184*  --   < > = values in this interval not displayed.  Lipid Panel     Component Value Date/Time   CHOL 105 (L) 10/17/2015 1204   CHOL 110 02/28/2015 0941   TRIG 77 10/17/2015 1204   HDL 38 (L) 10/17/2015 1204   HDL 38 (L) 02/28/2015 0941   CHOLHDL 2.8 10/17/2015 1204   VLDL 15 10/17/2015 1204   LDLCALC 52 10/17/2015 1204   LDLCALC 55 02/28/2015 0941   Thyroid: Lab Results  Component Value Date   TSH 2.626 11/16/2015    Hemoglobin A1C: Lab Results  Component Value Date   HGBA1C 7.0 (H) 11/16/2015     Radiology: Cardiomegaly, possible right basilar infiltrate.    EKG: Poor R wave progression, sinus rhythm  Discharge Medications:   Medication List        TAKE these medications   aspirin 81 MG tablet Take 81 mg by mouth at bedtime.   carvedilol 3.125 MG tablet Commonly known as:  COREG TAKE 1 TABLET (3.125 MG TOTAL) BY MOUTH 2 (TWO) TIMES DAILY WITH A MEAL.   docusate sodium 50 MG capsule Commonly known as:  COLACE Take 50 mg by mouth daily.   furosemide 40 MG tablet Commonly known as:  LASIX Take 1 tablet (40 mg total) by mouth 2 (two) times daily.   ivabradine 5 MG Tabs tablet Commonly known as:  CORLANOR Take 1 tablet (5 mg total) by mouth 2 (two) times daily with a meal.   multivitamin tablet Take 1 tablet by mouth daily.   omeprazole 40 MG capsule Commonly known as:  PRILOSEC TAKE ONE CAPSULE BY MOUTH TWICE A DAY FOR ACID REDUCTION   sacubitril-valsartan 24-26 MG Commonly known as:  ENTRESTO One twice daily to strengthen the heart   silver sulfADIAZINE 1 %  cream Commonly known as:  SILVADENE Cleanse wound daily then apply cream to the wound and cover with gauze   simvastatin 40 MG tablet Commonly known as:  ZOCOR TAKE ONE TABLET BY MOUTH ONCE DAILY FOR CHOLESTEROL   spironolactone 25 MG tablet Commonly known as:  ALDACTONE TAKE 1 TABLET BY MOUTH EVERY MORNING  TO REDUCE EDEMA AND TO STRENGTHEN THE HEART   traMADol 50 MG tablet Commonly known as:  ULTRAM Take 1 tablet (50 mg total) by mouth every 6 (six) hours as needed for severe pain.       Followup plans and appointments: IN nursing home:  Weigh daily. Check BMP 2 times a week until stable Keep weight around 180 pound adjustment of diuretics may be necessary Wound care per nursing home routine for decubitus and lower leg ulcers Followup with me within one week of discharge from nursing home.   Time spent with patient to include physician time: 45 minutes   Signed: W. Doristine Church. MD [facility] 11/23/2015, 8:28 AM

## 2015-11-23 NOTE — Clinical Social Work Placement (Signed)
   CLINICAL SOCIAL WORK PLACEMENT  NOTE  Date:  11/23/2015  Patient Details  Name: Jimmy Dawson MRN: [id number] Date of Birth: 1928/09/06  Clinical Social Work is seeking post-discharge placement for this patient at the [place] level of care (*CSW will initial, date and re-position this form in  chart as items are completed):  Yes   Patient/family provided with [place] Work Department's list of facilities offering this level of care within the geographic area requested by the patient (or if unable, by the patient's family).  Yes   Patient/family informed of their freedom to choose among providers that offer the needed level of care, that participate in Medicare, Medicaid or managed care program needed by the patient, have an available bed and are willing to accept the patient.  Yes   Patient/family informed of [place]'s ownership interest in [facility] and [facility], as well as of the fact that they are under no obligation to receive care at these facilities.  PASRR submitted to EDS on 11/20/15     PASRR number received on 11/20/15     Existing PASRR number confirmed on       FL2 transmitted to all facilities in geographic area requested by pt/family on 11/20/15     FL2 transmitted to all facilities within larger geographic area on       Patient informed that his/her managed care company has contracts with or will negotiate with certain facilities, including the following:        Yes   Patient/family informed of bed offers received.  Patient chooses bed at [facility]     Physician recommends and patient chooses bed at      Patient to be transferred to [facility] on 11/23/15.  Patient to be transferred to facility by ambulance     Patient family notified on 11/23/15 of transfer.  Name of family member notified:  [name],[name]     PHYSICIAN Please sign FL2, Please prepare priority discharge summary,  including medications, Please prepare prescriptions     Additional Comment:  Per MD patient is ready to discharge to [facility] RN, patient, patient's family, and facility notified of discharge. RN given phone number for report and transport packet is on patient's chart. Ambulance transport requested. CSW signing off.   _______________________________________________ Samule Dry, LCSW 11/23/2015, 11:26 AM

## 2015-11-23 NOTE — Clinical Social Work Note (Signed)
CSW met with patient, patient's wife, and patient's granddaughter. Patient and patient's family decided on [facility] SNF. Patient has bed available with [facility] SNF once medically ready for discharge.   [employer], LCSW [phone]

## 2015-11-23 NOTE — Care Management Important Message (Signed)
Important Message  Patient Details  Name: Jimmy Dawson MRN: [id number] Date of Birth: May 07, 1928   Medicare Important Message Given:  Yes    Fed Ceci Abena 11/23/2015, 11:19 AM

## 2015-11-23 NOTE — Progress Notes (Signed)
°   11/23/15 0959  Clinical Encounter Type  Visited With Patient;Family;Patient and family together  Visit Type Initial;Spiritual support;Other (Comment) (Advance Directive)  Referral From Physician  Spiritual Encounters  Spiritual Needs Literature;Prayer  Stress Factors  Patient Stress Factors Health changes  Family Stress Factors Health changes  Advance Directives (For Healthcare)  Does patient have an advance directive? Yes  Would patient like information on creating an advanced directive? Yes - Educational materials given  Type of Scientist, forensic Power of [place] to request for consult from Dr. Wynonia Lawman. Patient is being discharged and would like information on obtaining an advanced directive before leaving for rehabilitation. Patient's wife and daughter were present in the room. I called for a notary and witness to help complete the paperwork. Healthcare POA was notarized and given to nurse to make needed copies for patient.  Drue Dun, Chaplain On-call 10:58 AM, 11/23/2015

## 2015-11-25 ENCOUNTER — Telehealth: Payer: Self-pay | Admitting: Internal Medicine

## 2015-12-01 ENCOUNTER — Other Ambulatory Visit: Payer: Self-pay | Admitting: Internal Medicine

## 2015-12-01 ENCOUNTER — Telehealth: Payer: Self-pay

## 2015-12-01 NOTE — Telephone Encounter (Signed)
Patient's wife Karena Addison called requesting to speak with Dr.Green. Patient is currently at [facility] and Mrs.Eben would like to let Dr.Green know what is going on.   Mrs.Macari was not willing to disclose this information to anyone other than Dr.Green

## 2015-12-07 ENCOUNTER — Encounter: Payer: Self-pay | Admitting: Internal Medicine

## 2015-12-07 NOTE — Telephone Encounter (Signed)
I called Mrs. Vonada. Patient is currently at [facility] for rehabilitation following his last hospital. She says that he was not continued on his metformin. At the hospital they had him on insulin and before meal checks of his blood sugar. She was told by the NP at [facility] that he would be started on the metformin again. He has a tentative discharge date 12/14/2015.

## 2016-01-04 ENCOUNTER — Ambulatory Visit (INDEPENDENT_AMBULATORY_CARE_PROVIDER_SITE_OTHER): Payer: Federal, State, Local not specified - PPO | Admitting: Internal Medicine

## 2016-01-04 ENCOUNTER — Encounter: Payer: Self-pay | Admitting: Internal Medicine

## 2016-01-04 VITALS — BP 120/62 | HR 84 | Temp 97.9°F | Ht 66.0 in | Wt 173.0 lb

## 2016-01-04 DIAGNOSIS — N183 Chronic kidney disease, stage 3 unspecified: Secondary | ICD-10-CM

## 2016-01-04 DIAGNOSIS — E1122 Type 2 diabetes mellitus with diabetic chronic kidney disease: Secondary | ICD-10-CM

## 2016-01-04 DIAGNOSIS — I5022 Chronic systolic (congestive) heart failure: Secondary | ICD-10-CM | POA: Diagnosis not present

## 2016-01-04 DIAGNOSIS — R413 Other amnesia: Secondary | ICD-10-CM

## 2016-01-04 DIAGNOSIS — N182 Chronic kidney disease, stage 2 (mild): Secondary | ICD-10-CM | POA: Diagnosis not present

## 2016-01-04 DIAGNOSIS — I10 Essential (primary) hypertension: Secondary | ICD-10-CM | POA: Diagnosis not present

## 2016-01-04 DIAGNOSIS — Z23 Encounter for immunization: Secondary | ICD-10-CM | POA: Diagnosis not present

## 2016-01-04 DIAGNOSIS — R0600 Dyspnea, unspecified: Secondary | ICD-10-CM

## 2016-01-04 DIAGNOSIS — D649 Anemia, unspecified: Secondary | ICD-10-CM | POA: Diagnosis not present

## 2016-01-04 LAB — CBC WITH DIFFERENTIAL/PLATELET
BASOS ABS: 0 {cells}/uL (ref 0–200)
Basophils Relative: 0 %
EOS PCT: 5 %
Eosinophils Absolute: 480 cells/uL (ref 15–500)
HCT: 37.3 % — ABNORMAL LOW (ref 38.5–50.0)
HEMOGLOBIN: 12.2 g/dL — AB (ref 13.2–17.1)
LYMPHS ABS: 2304 {cells}/uL (ref 850–3900)
Lymphocytes Relative: 24 %
MCH: 28 pg (ref 27.0–33.0)
MCHC: 32.7 g/dL (ref 32.0–36.0)
MCV: 85.7 fL (ref 80.0–100.0)
MONOS PCT: 8 %
MPV: 10.8 fL (ref 7.5–12.5)
Monocytes Absolute: 768 cells/uL (ref 200–950)
NEUTROS ABS: 6048 {cells}/uL (ref 1500–7800)
Neutrophils Relative %: 63 %
PLATELETS: 207 10*3/uL (ref 140–400)
RBC: 4.35 MIL/uL (ref 4.20–5.80)
RDW: 15.9 % — ABNORMAL HIGH (ref 11.0–15.0)
WBC: 9.6 10*3/uL (ref 3.8–10.8)

## 2016-01-04 LAB — COMPREHENSIVE METABOLIC PANEL
ALBUMIN: 4.4 g/dL (ref 3.6–5.1)
ALT: 15 U/L (ref 9–46)
AST: 20 U/L (ref 10–35)
Alkaline Phosphatase: 63 U/L (ref 40–115)
BILIRUBIN TOTAL: 0.8 mg/dL (ref 0.2–1.2)
BUN: 34 mg/dL — AB (ref 7–25)
CHLORIDE: 99 mmol/L (ref 98–110)
CO2: 32 mmol/L — AB (ref 20–31)
CREATININE: 1.69 mg/dL — AB (ref 0.70–1.11)
Calcium: 9.7 mg/dL (ref 8.6–10.3)
Glucose, Bld: 66 mg/dL (ref 65–99)
Potassium: 4.1 mmol/L (ref 3.5–5.3)
SODIUM: 143 mmol/L (ref 135–146)
Total Protein: 7.3 g/dL (ref 6.1–8.1)

## 2016-01-04 NOTE — Progress Notes (Signed)
Facility  [facility]    Place of Service:   OFFICE    No Known Allergies  Chief Complaint  Patient presents with  . Medical Management of Chronic Issues    12/14/15 discharged from NH, was in hospital 11/16/15 to 11/23/15 for acute on chronic systolic congestive heart failure. Here with wife    HPI:   Patient was hospitalized 11/16/2015 through 11/23/2015 for decompensation of his congestive heart failure. He was diuresed over 20 pounds. He then went to rehabilitation facility. He left that facility 12/14/2015. He says that things are going well since he went home. His wife says things are not going well. She states that he has had personality changes and goes into rages from time to time patient denies this. She says that he gets quite angry when he can't find words. She continues to say his memory is very poor.  Chronic systolic heart failure (HCC) -  Weight is down 25 pounds since he was last in this office. Lungs are clear. He continues to have dyspnea and states that he cannot walk more than about 70 yards.  Encounter for immunization - Plan: Flu Vaccine QUAD 36+ mos IM  Mild memory disturbance - unchanged  Stage III chronic kidney disease - follow up lab  Type 2 diabetes mellitus with stage 2 chronic kidney disease, without long-term current use of insulin ([place]) - follow up lab  Essential hypertension -- controlled  Anemia, unspecified type - continues on iron tablets. Follow-up lab.   Dyspnea, unspecified type - Chronic. Improved since he was last in this office. Diuresis as certainly helped his    Medications: Patient's Medications  New Prescriptions   No medications on file  Previous Medications   ASPIRIN 81 MG TABLET    Take 81 mg by mouth at bedtime.    CARVEDILOL (COREG) 3.125 MG TABLET    TAKE 1 TABLET (3.125 MG TOTAL) BY MOUTH 2 (TWO) TIMES DAILY WITH A MEAL.   DOCUSATE SODIUM (COLACE) 50 MG CAPSULE    Take 50 mg by mouth daily.   FERROUS SULFATE 325 (65 FE) MG  TABLET    TAKE 1 TABLET BY MOUTH 3 TIMES A DAY WITH MEALS   FUROSEMIDE (LASIX) 40 MG TABLET    Take 1 tablet (40 mg total) by mouth 2 (two) times daily.   GLIMEPIRIDE (AMARYL) 2 MG TABLET    TAKE ONE TABLET BY MOUTH ONCE DAILY WITH BREAKFAST TO CONTROL DIABETES   METFORMIN (GLUCOPHAGE) 500 MG TABLET    TAKE 2 TABLETS BY MOUTH TWICE A DAY WITH MEALS   MULTIPLE VITAMIN (MULTIVITAMIN) TABLET    Take 1 tablet by mouth daily.    OMEPRAZOLE (PRILOSEC) 40 MG CAPSULE    TAKE ONE CAPSULE BY MOUTH TWICE A DAY FOR ACID REDUCTION   SACUBITRIL-VALSARTAN (ENTRESTO) 24-26 MG    One twice daily to strengthen the heart   SILVER SULFADIAZINE (SILVADENE) 1 % CREAM    Cleanse wound daily then apply cream to the wound and cover with gauze   SIMVASTATIN (ZOCOR) 40 MG TABLET    TAKE ONE TABLET BY MOUTH ONCE DAILY FOR CHOLESTEROL   SPIRONOLACTONE (ALDACTONE) 25 MG TABLET    TAKE 1 TABLET BY MOUTH EVERY MORNING TO REDUCE EDEMA AND TO STRENGTHEN THE HEART   TORSEMIDE (DEMADEX) 20 MG TABLET    Take one tablet daily   TRAMADOL (ULTRAM) 50 MG TABLET    Take 1 tablet (50 mg total) by mouth every 6 (six) hours  as needed for severe pain.  Modified Medications   No medications on file  Discontinued Medications   IVABRADINE (CORLANOR) 5 MG TABS TABLET    Take 1 tablet (5 mg total) by mouth 2 (two) times daily with a meal.    Review of Systems  Constitutional: Negative.  Negative for activity change, appetite change, chills, diaphoresis, fatigue and fever.  HENT: Positive for hearing loss. Negative for congestion, ear pain, nosebleeds, rhinorrhea, sinus pressure and tinnitus.        Wears prescription lenses. Otherwise negative in regards to symptoms. Wax in both EAC and impacted in right ear.  Eyes:       Wears prescription lenses. Negative for all other symptoms.  Respiratory: Positive for shortness of breath. Negative for cough, choking, chest tightness and wheezing.   Cardiovascular: Negative for chest pain, palpitations  and leg swelling.       History of combined systolic and diastolic congestive heart failure.  Gastrointestinal: Negative for abdominal pain, blood in stool, constipation and diarrhea.  Endocrine: Negative for cold intolerance, heat intolerance, polydipsia, polyphagia and polyuria.  Genitourinary: Negative for decreased urine volume, difficulty urinating, dysuria, enuresis, flank pain, frequency and urgency.  Musculoskeletal: Negative for arthralgias, back pain, gait problem and myalgias.  Allergic/Immunologic: Negative.   Neurological: Negative for dizziness, tremors, speech difficulty, weakness, light-headedness, numbness and headaches.       Patient says he is aware that his memory is slipping. Difficulty with word finding. 06/30/2014 MMSE 30/30. Passed clock drawing.  Hematological:       Hx of anemia  Psychiatric/Behavioral: Positive for confusion and decreased concentration.       Episodes of agitation and rage per his wife.    Vitals:   01/04/16 1000  BP: 120/62  Pulse: 84  Temp: 97.9 F (36.6 C)  TempSrc: Oral  SpO2: 96%  Weight: 173 lb (78.5 kg)  Height: _0  (1.676 m)   Body mass index is 27.92 kg/m. Wt Readings from Last 3 Encounters:  01/04/16 173 lb (78.5 kg)  11/23/15 175 lb 14.4 oz (79.8 kg)  10/19/15 201 lb (91.2 kg)      Physical Exam  Constitutional: He is oriented to person, place, and time. He appears well-developed and well-nourished.  HENT:  Significant hearing loss bilaterally. Interferes with conversation.  Eyes:  Wears prescription lenses, otherwise normal.  Neck: Neck supple. No JVD present. No tracheal deviation present. No thyromegaly present.  Cardiovascular: Normal rate, regular rhythm, normal heart sounds and intact distal pulses.  Exam reveals no gallop.   No murmur heard. Pulmonary/Chest: No respiratory distress. He has no wheezes. He has rales (Moist rales in bibasilar lung areas). He exhibits no tenderness.  Diminished sounds in the  right lung.  Abdominal: Soft. Bowel sounds are normal. He exhibits no mass. There is no tenderness.  Genitourinary:  Genitourinary Comments: Prior penile edema has resolved.  Musculoskeletal: He exhibits edema (1-2+ bipedal).  Chronic back discomfort with movement.  Lymphadenopathy:    He has no cervical adenopathy.  Neurological: He is alert and oriented to person, place, and time. No cranial nerve deficit. Coordination normal.  Wife has reported issues with his memory in the past. He now says that he is aware of some memory slippage. He does not feel that it incapacitates him in any way.  Skin: Skin is warm and dry. No rash noted. No erythema.  Raw area at the sacrum.  Psychiatric: He has a normal mood and affect. His behavior is normal. Judgment  and thought content normal.    Labs reviewed: Lab Summary Latest Ref Rng & Units 11/23/2015 11/20/2015 11/19/2015 11/18/2015 11/17/2015 11/16/2015  Hemoglobin 13.0 - 17.0 g/dL (None) (None) (None) (None) (None) 11.5(L)  Hematocrit 39.0 - 52.0 % (None) (None) (None) (None) (None) 38.3(L)  White count 4.0 - 10.5 K/uL (None) (None) (None) (None) (None) 7.1  Platelet count 150 - 400 K/uL (None) (None) (None) (None) (None) 145(L)  Sodium 135 - 145 mmol/L (None) 135 138 139 139 143  Potassium 3.5 - 5.1 mmol/L (None) 3.9 3.9 4.0 4.2 3.6  Calcium 8.9 - 10.3 mg/dL (None) 8.5(L) 8.7(L) 8.6(L) 8.9 8.9  Phosphorus - (None) (None) (None) (None) (None) (None)  Creatinine 0.61 - 1.24 mg/dL 1.36(H) 1.54(H) 1.41(H) 1.55(H) 1.75(H) 1.75(H)  AST 15 - 41 U/L (None) (None) (None) (None) (None) 33  Alk Phos 38 - 126 U/L (None) (None) (None) (None) (None) 62  Bilirubin 0.3 - 1.2 mg/dL (None) (None) (None) (None) (None) 0.8  Glucose 65 - 99 mg/dL (None) 184(H) 126(H) 76 104(H) 101(H)  Cholesterol - (None) (None) (None) (None) (None) (None)  HDL cholesterol - (None) (None) (None) (None) (None) (None)  Triglycerides - (None) (None) (None) (None) (None) (None)  LDL Direct  - (None) (None) (None) (None) (None) (None)  LDL Calc - (None) (None) (None) (None) (None) (None)  Total protein 6.5 - 8.1 g/dL (None) (None) (None) (None) (None) 6.1(L)  Albumin 3.5 - 5.0 g/dL (None) (None) (None) (None) (None) 3.6  Some recent data might be hidden   Lab Results  Component Value Date   TSH 2.626 11/16/2015   TSH 2.092 03/07/2015   TSH 1.454 *Test methodology is 3rd generation TSH* 06/27/2007   Lab Results  Component Value Date   BUN 32 (H) 11/20/2015   BUN 32 (H) 11/19/2015   BUN 28 (H) 11/18/2015   Lab Results  Component Value Date   HGBA1C 7.0 (H) 11/16/2015   HGBA1C 6.8 (H) 10/17/2015   HGBA1C 6.5 (H) 07/20/2015    Assessment/Plan  1. Encounter for immunization - Flu Vaccine QUAD 36+ mos IM  2. Chronic systolic heart failure (HCC) - torsemide (DEMADEX) 20 MG tablet; Take one tablet daily  3. Mild memory disturbance unchaged  4. Stage III chronic kidney disease - Comprehensive metabolic panel  5. Type 2 diabetes mellitus with stage 2 chronic kidney disease, without long-term current use of insulin (HCC) - glimepiride (AMARYL) 2 MG tablet; TAKE ONE TABLET BY MOUTH ONCE DAILY WITH BREAKFAST TO CONTROL DIABETES; Refill: 2 - metFORMIN (GLUCOPHAGE) 500 MG tablet; TAKE 2 TABLETS BY MOUTH TWICE A DAY WITH MEALS; Refill: 1 - Comprehensive metabolic panel - Hemoglobin A1c  6. Essential hypertension - Comprehensive metabolic panel  7. Anemia, unspecified type - ferrous sulfate 325 (65 FE) MG tablet; TAKE 1 TABLET BY MOUTH 3 TIMES A DAY WITH MEALS; Refill: 3 - CBC with Differential/Platelet  8. Dyspnea, unspecified type - Brain natriuretic peptide

## 2016-01-06 ENCOUNTER — Other Ambulatory Visit: Payer: Self-pay | Admitting: Internal Medicine

## 2016-01-09 ENCOUNTER — Encounter: Payer: Self-pay | Admitting: Internal Medicine

## 2016-02-03 ENCOUNTER — Other Ambulatory Visit: Payer: Self-pay | Admitting: *Deleted

## 2016-02-03 ENCOUNTER — Other Ambulatory Visit: Payer: Self-pay | Admitting: Internal Medicine

## 2016-02-03 MED ORDER — SPIRONOLACTONE 25 MG PO TABS
ORAL_TABLET | ORAL | 1 refills | Status: DC
Start: 2016-02-03 — End: 2016-09-12

## 2016-02-03 NOTE — Telephone Encounter (Signed)
[facility] 

## 2016-03-06 ENCOUNTER — Other Ambulatory Visit: Payer: Self-pay | Admitting: Internal Medicine

## 2016-04-11 ENCOUNTER — Ambulatory Visit (INDEPENDENT_AMBULATORY_CARE_PROVIDER_SITE_OTHER): Payer: Federal, State, Local not specified - PPO | Admitting: Internal Medicine

## 2016-04-11 ENCOUNTER — Encounter: Payer: Self-pay | Admitting: Internal Medicine

## 2016-04-11 VITALS — BP 110/74 | HR 104 | Temp 97.5°F | Ht 66.0 in | Wt 176.0 lb

## 2016-04-11 DIAGNOSIS — E1122 Type 2 diabetes mellitus with diabetic chronic kidney disease: Secondary | ICD-10-CM

## 2016-04-11 DIAGNOSIS — I10 Essential (primary) hypertension: Secondary | ICD-10-CM | POA: Diagnosis not present

## 2016-04-11 DIAGNOSIS — N183 Chronic kidney disease, stage 3 unspecified: Secondary | ICD-10-CM

## 2016-04-11 DIAGNOSIS — I2510 Atherosclerotic heart disease of native coronary artery without angina pectoris: Secondary | ICD-10-CM | POA: Diagnosis not present

## 2016-04-11 DIAGNOSIS — M545 Low back pain, unspecified: Secondary | ICD-10-CM | POA: Insufficient documentation

## 2016-04-11 DIAGNOSIS — R413 Other amnesia: Secondary | ICD-10-CM

## 2016-04-11 DIAGNOSIS — E785 Hyperlipidemia, unspecified: Secondary | ICD-10-CM

## 2016-04-11 DIAGNOSIS — I679 Cerebrovascular disease, unspecified: Secondary | ICD-10-CM | POA: Diagnosis not present

## 2016-04-11 DIAGNOSIS — I5022 Chronic systolic (congestive) heart failure: Secondary | ICD-10-CM

## 2016-04-11 DIAGNOSIS — M791 Myalgia, unspecified site: Secondary | ICD-10-CM | POA: Insufficient documentation

## 2016-04-11 DIAGNOSIS — N182 Chronic kidney disease, stage 2 (mild): Secondary | ICD-10-CM

## 2016-04-11 DIAGNOSIS — G8929 Other chronic pain: Secondary | ICD-10-CM

## 2016-04-11 DIAGNOSIS — D649 Anemia, unspecified: Secondary | ICD-10-CM

## 2016-04-11 HISTORY — DX: Low back pain, unspecified: M54.50

## 2016-04-11 LAB — CBC WITH DIFFERENTIAL/PLATELET
Basophils Absolute: 0 cells/uL (ref 0–200)
Basophils Relative: 0 %
EOS PCT: 2 %
Eosinophils Absolute: 234 cells/uL (ref 15–500)
HCT: 38.3 % — ABNORMAL LOW (ref 38.5–50.0)
Hemoglobin: 12 g/dL — ABNORMAL LOW (ref 13.2–17.1)
LYMPHS PCT: 19 %
Lymphs Abs: 2223 cells/uL (ref 850–3900)
MCH: 28.6 pg (ref 27.0–33.0)
MCHC: 31.3 g/dL — AB (ref 32.0–36.0)
MCV: 91.2 fL (ref 80.0–100.0)
MONOS PCT: 6 %
MPV: 10.3 fL (ref 7.5–12.5)
Monocytes Absolute: 702 cells/uL (ref 200–950)
NEUTROS PCT: 73 %
Neutro Abs: 8541 cells/uL — ABNORMAL HIGH (ref 1500–7800)
PLATELETS: 300 10*3/uL (ref 140–400)
RBC: 4.2 MIL/uL (ref 4.20–5.80)
RDW: 15.4 % — AB (ref 11.0–15.0)
WBC: 11.7 10*3/uL — AB (ref 3.8–10.8)

## 2016-04-11 LAB — LIPID PANEL
CHOL/HDL RATIO: 2.9 ratio (ref <5.0)
Cholesterol: 123 mg/dL (ref <200)
HDL: 42 mg/dL (ref >40)
LDL Cholesterol: 61 mg/dL (ref <100)
Triglycerides: 100 mg/dL (ref <150)
VLDL: 20 mg/dL (ref <30)

## 2016-04-11 LAB — COMPREHENSIVE METABOLIC PANEL
ALT: 22 U/L (ref 9–46)
AST: 22 U/L (ref 10–35)
Albumin: 4.2 g/dL (ref 3.6–5.1)
Alkaline Phosphatase: 69 U/L (ref 40–115)
BILIRUBIN TOTAL: 0.6 mg/dL (ref 0.2–1.2)
BUN: 33 mg/dL — AB (ref 7–25)
CALCIUM: 9.9 mg/dL (ref 8.6–10.3)
CO2: 31 mmol/L (ref 20–31)
Chloride: 101 mmol/L (ref 98–110)
Creat: 1.86 mg/dL — ABNORMAL HIGH (ref 0.70–1.11)
GLUCOSE: 138 mg/dL — AB (ref 65–99)
Potassium: 4.8 mmol/L (ref 3.5–5.3)
SODIUM: 144 mmol/L (ref 135–146)
Total Protein: 7.6 g/dL (ref 6.1–8.1)

## 2016-04-11 LAB — HEMOGLOBIN A1C
Hgb A1c MFr Bld: 6.5 % — ABNORMAL HIGH (ref <5.7)
MEAN PLASMA GLUCOSE: 140 mg/dL
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
## 2016-04-12 LAB — MICROALBUMIN, URINE: Microalb, Ur: 52.9 mg/dL

## 2016-04-13 ENCOUNTER — Telehealth: Payer: Self-pay | Admitting: *Deleted
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
## 2016-06-02 ENCOUNTER — Other Ambulatory Visit: Payer: Self-pay | Admitting: Internal Medicine

## 2016-06-09 IMAGING — CR DG CHEST 2V
2 series · 2 of 2 positions shown · non-contrast
Comparison: Chest x-ray 10/04/2013, 10/03/2013, 06/27/2007

CLINICAL DATA: 85-year-old male with a history of cough, shortness
of breath, wheezing.

EXAM:
CHEST - 2 VIEW

[view not recorded (1 of 2)]
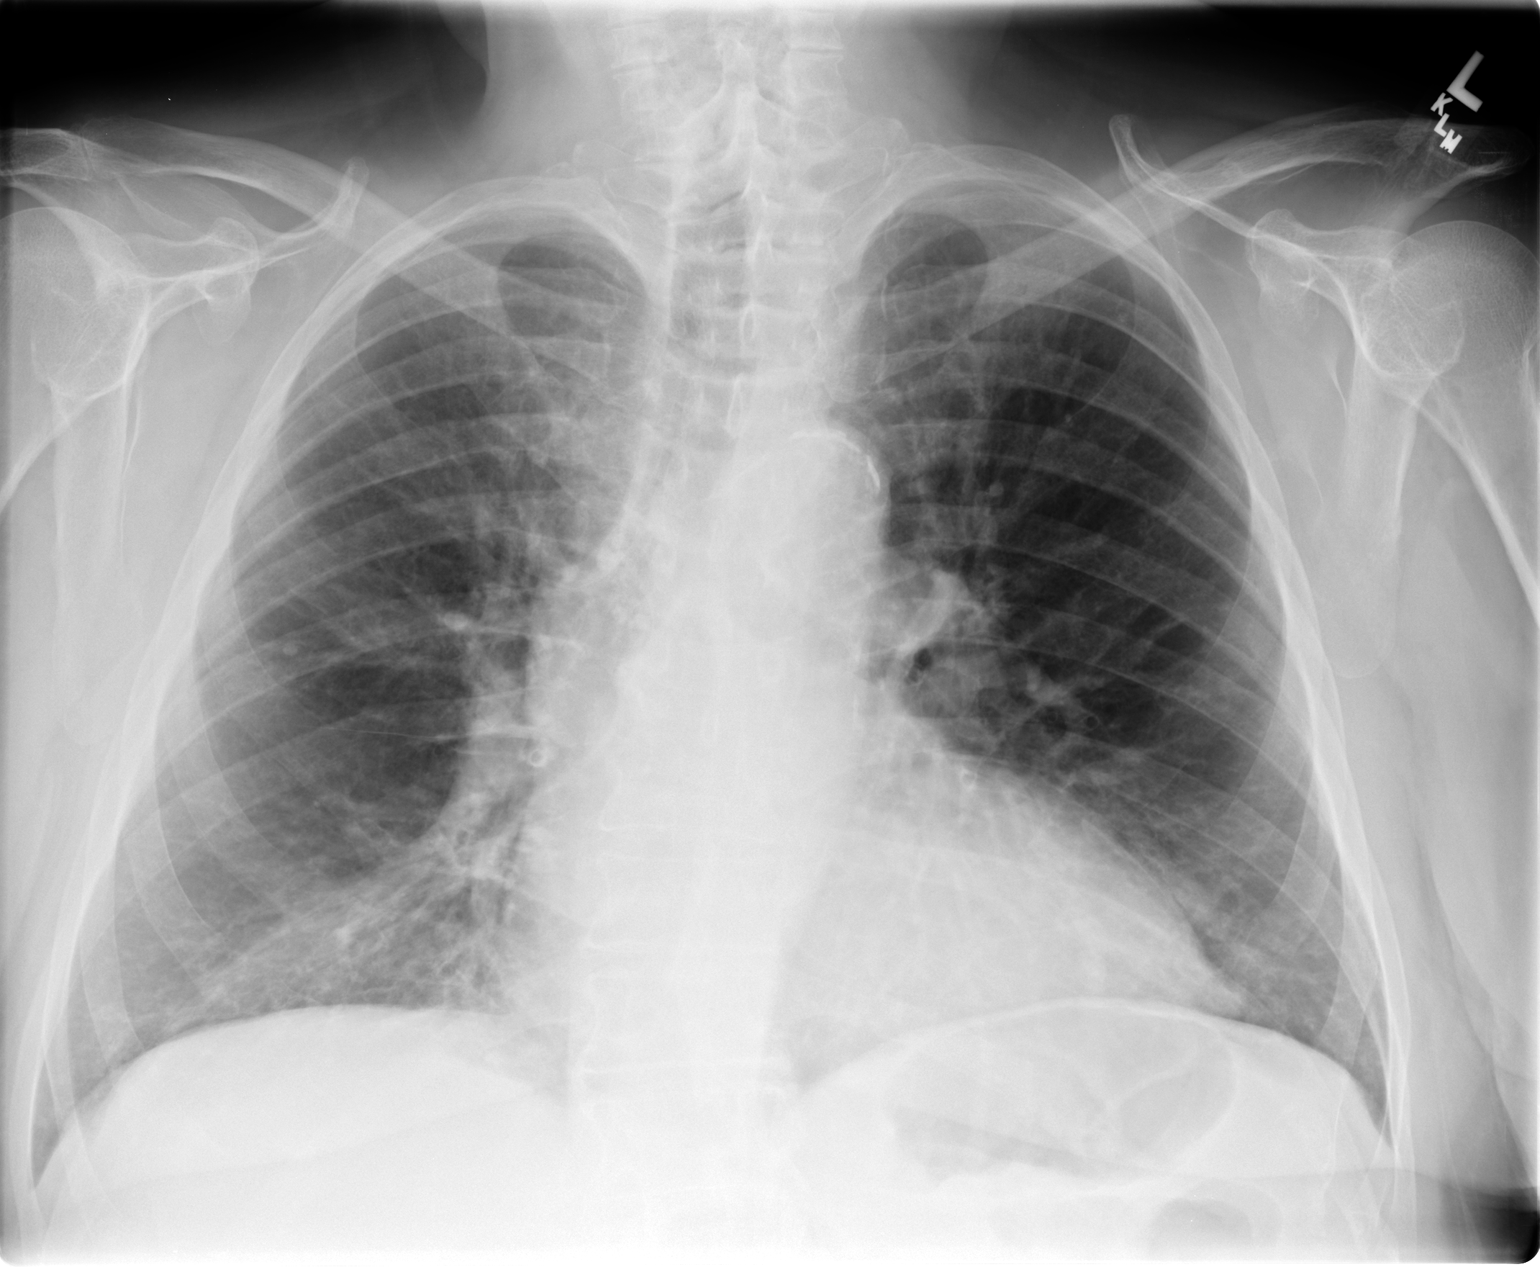

[view not recorded (2 of 2)]
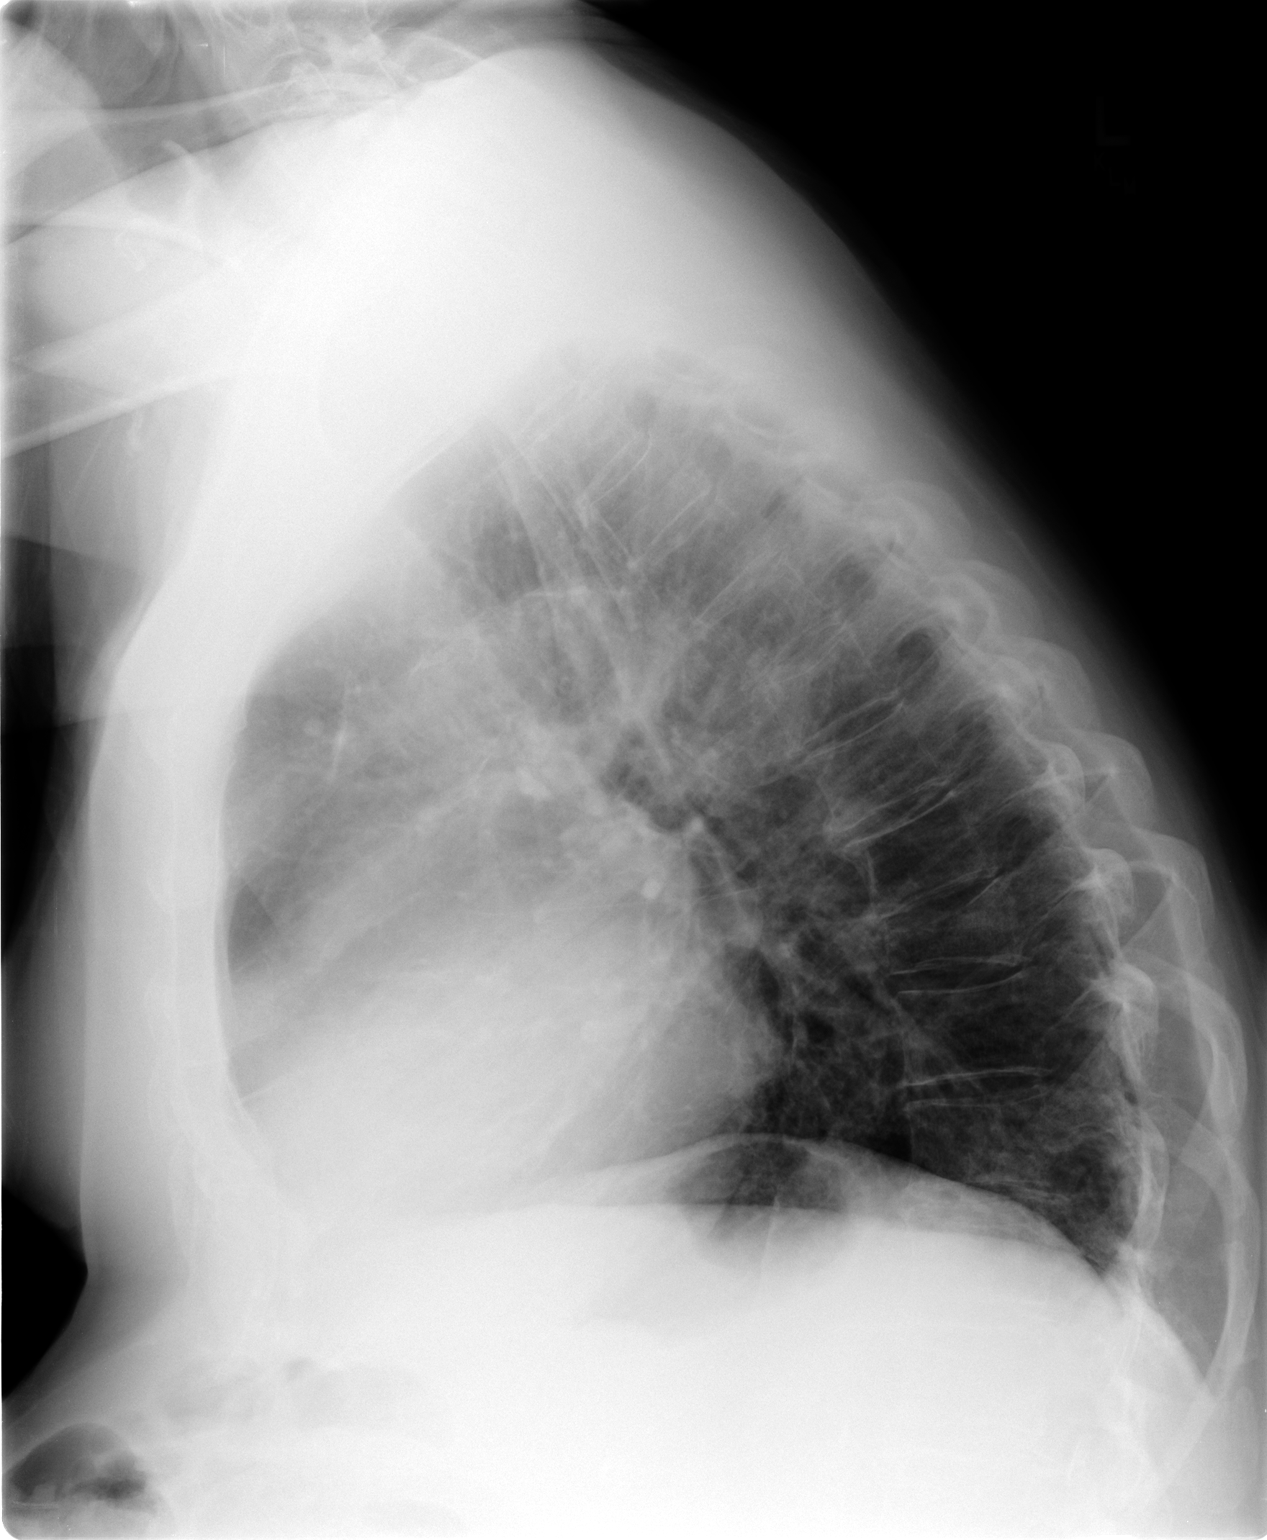

[2 of 2 positions shown; findings below may reference images not displayed]

FINDINGS: Cardiomediastinal silhouette unchanged in size and contour.
Borderline cardiomegaly.

Atherosclerotic calcifications of the aortic arch.

No evidence of pulmonary vascular congestion.

No pneumothorax.

Developing opacity at the right base, with interstitial opacities in
the suprahilar regions bilaterally, more pronounced than on the
comparison.

No pleural effusion.

No displaced fracture. Compression fracture of the low thoracic
vertebral body is unchanged from the comparison plain film.
IMPRESSION: Evidence of developing right middle lobe pneumonia. Repeat plain
film it is recommended once the patient has cleared this acute
process to assure resolution/re-expansion of the right middle lobe.

Atherosclerosis

## 2016-06-29 ENCOUNTER — Other Ambulatory Visit: Payer: Self-pay | Admitting: Internal Medicine

## 2016-06-29 IMAGING — CR DG CHEST 2V
2 series · 2 of 2 positions shown · non-contrast
Comparison: PA and lateral chest of April 06, 2014 and October 04, 2013.

CLINICAL DATA: Three-week history of pneumonia with clinical
improvement

EXAM:
CHEST  2 VIEW

[view not recorded (1 of 2)]
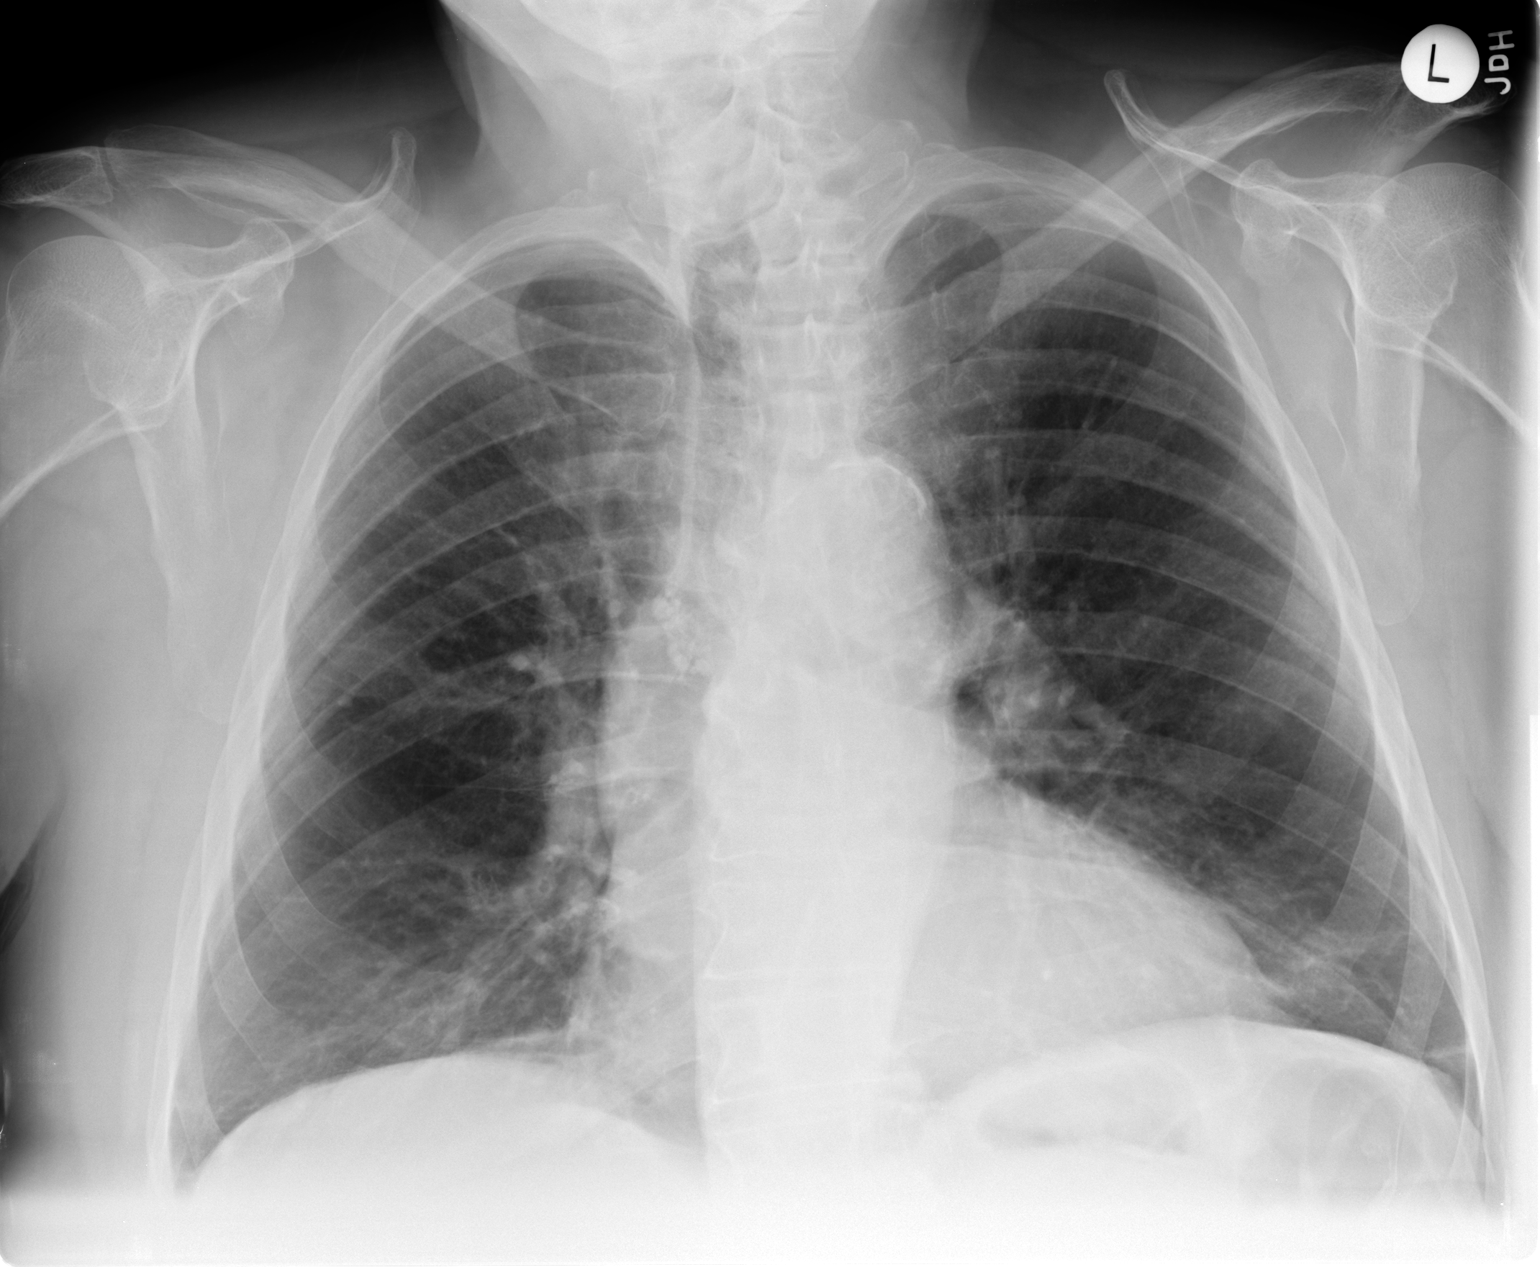

[view not recorded (2 of 2)]
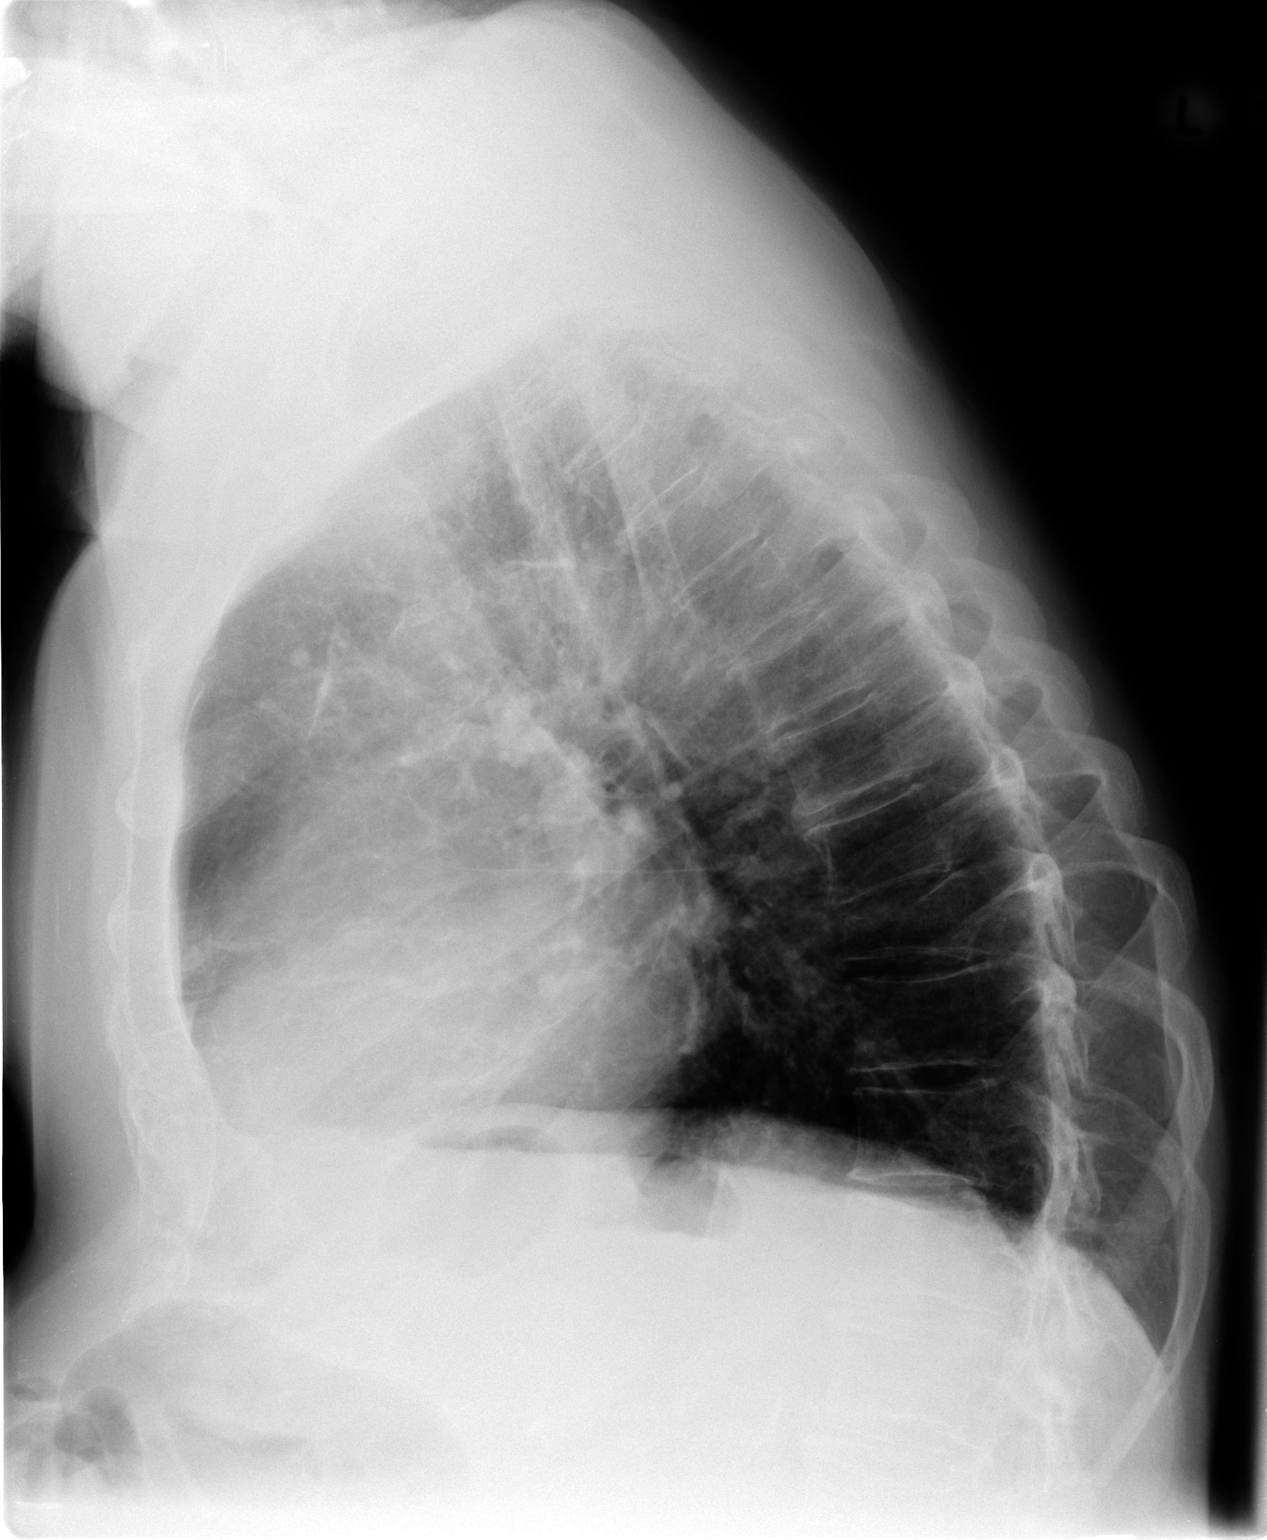

[2 of 2 positions shown; findings below may reference images not displayed]

FINDINGS: The lungs mildly hyperinflated with hemidiaphragm flattening. There
is no alveolar infiltrate. The lung markings are minimally prominent
at both bases and in the right perihilar region but are stable since
the October 04, 2013 study. The cardiac silhouette is top-normal in size
but stable. The pulmonary vascularity is normal. The mediastinum is
normal in width. There is mild tortuosity of the descending thoracic
aorta. The bony thorax is unremarkable.
IMPRESSION: COPD. There has been interval clearing of right middle lobe
pneumonia. No active cardiopulmonary disease is evident.

## 2016-07-10 ENCOUNTER — Ambulatory Visit (INDEPENDENT_AMBULATORY_CARE_PROVIDER_SITE_OTHER): Payer: Federal, State, Local not specified - PPO | Admitting: Internal Medicine

## 2016-07-10 ENCOUNTER — Encounter: Payer: Self-pay | Admitting: Internal Medicine

## 2016-07-10 VITALS — BP 116/78 | HR 80 | Temp 97.3°F | Ht 66.0 in | Wt 178.0 lb

## 2016-07-10 DIAGNOSIS — R413 Other amnesia: Secondary | ICD-10-CM | POA: Diagnosis not present

## 2016-07-10 DIAGNOSIS — E785 Hyperlipidemia, unspecified: Secondary | ICD-10-CM

## 2016-07-10 DIAGNOSIS — I2510 Atherosclerotic heart disease of native coronary artery without angina pectoris: Secondary | ICD-10-CM

## 2016-07-10 DIAGNOSIS — I10 Essential (primary) hypertension: Secondary | ICD-10-CM | POA: Diagnosis not present

## 2016-07-10 DIAGNOSIS — E1122 Type 2 diabetes mellitus with diabetic chronic kidney disease: Secondary | ICD-10-CM | POA: Diagnosis not present

## 2016-07-10 DIAGNOSIS — D649 Anemia, unspecified: Secondary | ICD-10-CM | POA: Diagnosis not present

## 2016-07-10 DIAGNOSIS — N182 Chronic kidney disease, stage 2 (mild): Secondary | ICD-10-CM

## 2016-07-10 DIAGNOSIS — N183 Chronic kidney disease, stage 3 unspecified: Secondary | ICD-10-CM

## 2016-07-10 LAB — CBC WITH DIFFERENTIAL/PLATELET
BASOS ABS: 0 {cells}/uL (ref 0–200)
BASOS PCT: 0 %
EOS ABS: 474 {cells}/uL (ref 15–500)
Eosinophils Relative: 6 %
HEMATOCRIT: 37.8 % — AB (ref 38.5–50.0)
HEMOGLOBIN: 11.9 g/dL — AB (ref 13.2–17.1)
LYMPHS PCT: 23 %
Lymphs Abs: 1817 cells/uL (ref 850–3900)
MCH: 27.7 pg (ref 27.0–33.0)
MCHC: 31.5 g/dL — ABNORMAL LOW (ref 32.0–36.0)
MCV: 87.9 fL (ref 80.0–100.0)
MONO ABS: 553 {cells}/uL (ref 200–950)
MONOS PCT: 7 %
MPV: 10.8 fL (ref 7.5–12.5)
Neutro Abs: 5056 cells/uL (ref 1500–7800)
Neutrophils Relative %: 64 %
Platelets: 216 10*3/uL (ref 140–400)
RBC: 4.3 MIL/uL (ref 4.20–5.80)
RDW: 16.3 % — ABNORMAL HIGH (ref 11.0–15.0)
WBC: 7.9 10*3/uL (ref 3.8–10.8)

## 2016-07-10 LAB — COMPREHENSIVE METABOLIC PANEL
ALBUMIN: 4.5 g/dL (ref 3.6–5.1)
ALK PHOS: 53 U/L (ref 40–115)
ALT: 18 U/L (ref 9–46)
AST: 25 U/L (ref 10–35)
BILIRUBIN TOTAL: 0.8 mg/dL (ref 0.2–1.2)
BUN: 35 mg/dL — ABNORMAL HIGH (ref 7–25)
CALCIUM: 9.7 mg/dL (ref 8.6–10.3)
CO2: 30 mmol/L (ref 20–31)
Chloride: 98 mmol/L (ref 98–110)
Creat: 2.14 mg/dL — ABNORMAL HIGH (ref 0.70–1.11)
GLUCOSE: 123 mg/dL — AB (ref 65–99)
POTASSIUM: 4.7 mmol/L (ref 3.5–5.3)
Sodium: 141 mmol/L (ref 135–146)
TOTAL PROTEIN: 7.3 g/dL (ref 6.1–8.1)

## 2016-07-10 NOTE — Progress Notes (Signed)
Facility  [facility]    Place of Service:   OFFICE    No Known Allergies  Chief Complaint  Patient presents with  . Medical Management of Chronic Issues    3 month medication management blood sugar, blood pressure, CHF, CKD, anemia, memory  . MMSE    26/30 passed clock drawing    HPI:  Type 2 diabetes mellitus with stage 2 chronic kidney disease, without long-term current use of insulin (HCC) - no recent lab  Essential hypertension - controlled  Hyperlipidemia, unspecified hyperlipidemia type - controlled  Coronary artery disease involving native coronary artery of native heart without angina pectoris - denies angina or palpitations  Mild memory disturbance - stable  Stage III chronic kidney disease - no recent lab  Anemia, unspecified type - no recent lab    Medications: Patient's Medications  New Prescriptions   No medications on file  Previous Medications   ASPIRIN 81 MG TABLET    Take 81 mg by mouth at bedtime.    CARVEDILOL (COREG) 3.125 MG TABLET    TAKE 1 TABLET (3.125 MG TOTAL) BY MOUTH 2 (TWO) TIMES DAILY WITH A MEAL.   DOCUSATE SODIUM (COLACE) 50 MG CAPSULE    Take 50 mg by mouth daily.   FERROUS SULFATE 325 (65 FE) MG TABLET    TAKE 1 TABLET BY MOUTH 3 TIMES A DAY WITH MEALS   GLIMEPIRIDE (AMARYL) 2 MG TABLET    TAKE ONE TABLET BY MOUTH ONCE DAILY WITH BREAKFAST TO CONTROL DIABETES   METFORMIN (GLUCOPHAGE) 500 MG TABLET    TAKE 2 TABLETS BY MOUTH TWICE A DAY WITH MEALS   MULTIPLE VITAMIN (MULTIVITAMIN) TABLET    Take 1 tablet by mouth daily.    OMEPRAZOLE (PRILOSEC) 40 MG CAPSULE    TAKE ONE CAPSULE BY MOUTH TWICE A DAY FOR ACID REDUCTION   SACUBITRIL-VALSARTAN (ENTRESTO) 24-26 MG    One twice daily to strengthen the heart   SIMVASTATIN (ZOCOR) 40 MG TABLET    TAKE ONE TABLET BY MOUTH ONCE DAILY FOR CHOLESTEROL   SPIRONOLACTONE (ALDACTONE) 25 MG TABLET    Take one tablet by mouth every morning to reduce edema and to strengthen the heart   TORSEMIDE (DEMADEX)  20 MG TABLET    Take one tablet twice daily  Modified Medications   No medications on file  Discontinued Medications   FUROSEMIDE (LASIX) 40 MG TABLET    Take 1 tablet (40 mg total) by mouth 2 (two) times daily.    Review of Systems  Constitutional: Negative.  Negative for activity change, appetite change, chills, diaphoresis, fatigue and fever.  HENT: Positive for hearing loss. Negative for congestion, ear pain, nosebleeds, rhinorrhea, sinus pressure and tinnitus.        Wears prescription lenses. Otherwise negative in regards to symptoms. Wax in both EAC and impacted in right ear.  Eyes:       Wears prescription lenses. Negative for all other symptoms.  Respiratory: Positive for shortness of breath. Negative for cough, choking, chest tightness and wheezing.   Cardiovascular: Positive for leg swelling. Negative for chest pain and palpitations.       History of combined systolic and diastolic congestive heart failure.  Gastrointestinal: Negative for abdominal pain, blood in stool, constipation and diarrhea.  Endocrine: Negative for cold intolerance, heat intolerance, polydipsia, polyphagia and polyuria.  Genitourinary: Negative for decreased urine volume, difficulty urinating, dysuria, enuresis, flank pain, frequency and urgency.  Musculoskeletal: Positive for myalgias. Negative for arthralgias, back  pain and gait problem.  Skin:       Itch all oveer  Allergic/Immunologic: Negative.   Neurological: Negative for dizziness, tremors, speech difficulty, weakness, light-headedness, numbness and headaches.       Patient says he is aware that his memory is slipping. Difficulty with word finding. 06/30/2014 MMSE 30/30. Passed clock drawing. Cerebrovascular disease and history of "sun stroke".  Hematological:       Hx of anemia  Psychiatric/Behavioral: Positive for confusion and decreased concentration.       Episodes of agitation and rage per his wife.    Vitals:   07/10/16 1403  BP:  116/78  Pulse: 80  Temp: 97.3 F (36.3 C)  TempSrc: Oral  SpO2: 92%  Weight: 178 lb (80.7 kg)  Height: '5\' 6"'$  (1.676 m)   Body mass index is 28.73 kg/m. Wt Readings from Last 3 Encounters:  07/10/16 178 lb (80.7 kg)  04/11/16 176 lb (79.8 kg)  01/04/16 173 lb (78.5 kg)      Physical Exam  Constitutional: He is oriented to person, place, and time. He appears well-developed and well-nourished.  HENT:  Significant hearing loss bilaterally. Interferes with conversation.  Eyes:  Wears prescription lenses, otherwise normal.  Neck: Neck supple. No JVD present. No tracheal deviation present. No thyromegaly present.  Cardiovascular: Normal rate, regular rhythm, normal heart sounds and intact distal pulses.  Exam reveals no gallop.   No murmur heard. Pulmonary/Chest: No respiratory distress. He has no wheezes. He has rales (Moist rales in bibasilar lung areas). He exhibits no tenderness.  Diminished sounds in the right lung.  Abdominal: Soft. Bowel sounds are normal. He exhibits no mass. There is no tenderness.  Genitourinary:  Genitourinary Comments: Prior penile edema has resolved.  Musculoskeletal: He exhibits edema (1-2+ bipedal).  Chronic back discomfort with movement.  Lymphadenopathy:    He has no cervical adenopathy.  Neurological: He is alert and oriented to person, place, and time. No cranial nerve deficit. Coordination normal.  Wife has reported issues with his memory in the past. He now says that he is aware of some memory slippage. He does not feel that it incapacitates him in any way. 07/10/16 MMSE 26/30. Passed clock drawing.  Skin: Skin is warm and dry. No rash noted. No erythema.  Raw area at the sacrum.  Psychiatric: He has a normal mood and affect. His behavior is normal. Judgment and thought content normal.    Labs reviewed: Lab Summary Latest Ref Rng & Units 04/11/2016 01/04/2016  Hemoglobin 13.2 - 17.1 g/dL 12.0(L) 12.2(L)  Hematocrit 38.5 - 50.0 % 38.3(L)  37.3(L)  White count 3.8 - 10.8 K/uL 11.7(H) 9.6  Platelet count 140 - 400 K/uL 300 207  Sodium 135 - 146 mmol/L 144 143  Potassium 3.5 - 5.3 mmol/L 4.8 4.1  Calcium 8.6 - 10.3 mg/dL 9.9 9.7  Phosphorus - (None) (None)  Creatinine 0.70 - 1.11 mg/dL 1.86(H) 1.69(H)  AST 10 - 35 U/L 22 20  Alk Phos 40 - 115 U/L 69 63  Bilirubin 0.2 - 1.2 mg/dL 0.6 0.8  Glucose 65 - 99 mg/dL 138(H) 66  Cholesterol <200 mg/dL 123 (None)  HDL cholesterol >40 mg/dL 42 (None)  Triglycerides <150 mg/dL 100 (None)  LDL Direct - (None) (None)  LDL Calc <100 mg/dL 61 (None)  Total protein 6.1 - 8.1 g/dL 7.6 7.3  Albumin 3.6 - 5.1 g/dL 4.2 4.4  Some recent data might be hidden   Lab Results  Component Value Date  TSH 2.626 11/16/2015   TSH 2.092 03/07/2015   TSH 1.454 *Test methodology is 3rd generation TSH* 06/27/2007   Lab Results  Component Value Date   BUN 33 (H) 04/11/2016   BUN 34 (H) 01/04/2016   BUN 32 (H) 11/20/2015   Lab Results  Component Value Date   HGBA1C 6.5 (H) 04/11/2016   HGBA1C 7.0 (H) 11/16/2015   HGBA1C 6.8 (H) 10/17/2015    Assessment/Plan  1. Type 2 diabetes mellitus with stage 2 chronic kidney disease, without long-term current use of insulin (HCC) The current medical regimen is effective;  continue present plan and medications. - Hemoglobin A1c; Future - Comprehensive metabolic panel; Future - Comprehensive metabolic panel - Hemoglobin A1c  2. Essential hypertension The current medical regimen is effective;  continue present plan and medications. - Comprehensive metabolic panel; Future - Comprehensive metabolic panel  3. Hyperlipidemia, unspecified hyperlipidemia type Well controlled  4. Coronary artery disease involving native coronary artery of native heart without angina pectoris asymptomatic  5. Mild memory disturbance unchanged  6. Stage III chronic kidney disease - Comprehensive metabolic panel; Future - Comprehensive metabolic panel  7. Anemia,  unspecified type Remains on iron . - CBC with Differential/Platelet; Future - CBC with Differential/Platelet

## 2016-07-11 LAB — HEMOGLOBIN A1C
Hgb A1c MFr Bld: 6.4 % — ABNORMAL HIGH (ref <5.7)
Mean Plasma Glucose: 137 mg/dL

## 2016-07-18 ENCOUNTER — Encounter: Payer: Self-pay | Admitting: Internal Medicine

## 2016-07-18 ENCOUNTER — Telehealth: Payer: Self-pay | Admitting: *Deleted

## 2016-07-18 NOTE — Telephone Encounter (Signed)
Patient wife, Karena Addison called and stated that she needs a letter from you stating that patient has Dementia to take to the [facility] because his License Renewal is in June. She also wants to get a Handicap Placcard for him. Please Advise.

## 2016-07-18 NOTE — Telephone Encounter (Signed)
Handicap Placard filled out and wife will pick up. Awaiting Letter.

## 2016-07-28 ENCOUNTER — Other Ambulatory Visit: Payer: Self-pay | Admitting: Internal Medicine

## 2016-08-02 ENCOUNTER — Other Ambulatory Visit: Payer: Self-pay | Admitting: Internal Medicine

## 2016-08-02 DIAGNOSIS — I5042 Chronic combined systolic (congestive) and diastolic (congestive) heart failure: Secondary | ICD-10-CM

## 2016-08-03 ENCOUNTER — Telehealth: Payer: Self-pay | Admitting: Diagnostic Neuroimaging

## 2016-08-03 NOTE — Telephone Encounter (Signed)
LVM informing patient's wife that she needs to consult patient's PCP Dr Nyoka Cowden for assistance. Advised Dr Leta Baptist only saw patient once, and that was over a year ago. Advised that Dr Nyoka Cowden has seen him multiple times and should be able to offer assistance.
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
## 2016-08-07 NOTE — Telephone Encounter (Signed)
Received e mail message from answer phone and attempted to reach wife again. LVM giving her same message and advice as from previous VM on 08/03/16.

## 2016-08-16 ENCOUNTER — Ambulatory Visit
Admission: RE | Admit: 2016-08-16 | Discharge: 2016-08-16 | Disposition: A | Discharge status: Home / Self Care | Payer: Medicare Other | Source: Ambulatory Visit | Attending: Cardiology | Admitting: Cardiology

## 2016-08-16 ENCOUNTER — Other Ambulatory Visit: Payer: Self-pay | Admitting: Cardiology

## 2016-08-16 DIAGNOSIS — R0609 Other forms of dyspnea: Principal | ICD-10-CM

## 2016-09-12 ENCOUNTER — Ambulatory Visit (INDEPENDENT_AMBULATORY_CARE_PROVIDER_SITE_OTHER): Payer: Federal, State, Local not specified - PPO | Admitting: Internal Medicine

## 2016-09-12 ENCOUNTER — Encounter: Payer: Self-pay | Admitting: Internal Medicine

## 2016-09-12 VITALS — BP 112/76 | HR 87 | Temp 97.6°F | Ht 65.0 in | Wt 191.0 lb

## 2016-09-12 DIAGNOSIS — N183 Chronic kidney disease, stage 3 unspecified: Secondary | ICD-10-CM

## 2016-09-12 DIAGNOSIS — I10 Essential (primary) hypertension: Secondary | ICD-10-CM | POA: Diagnosis not present

## 2016-09-12 DIAGNOSIS — R413 Other amnesia: Secondary | ICD-10-CM

## 2016-09-12 DIAGNOSIS — D649 Anemia, unspecified: Secondary | ICD-10-CM

## 2016-09-12 DIAGNOSIS — Z9181 History of falling: Secondary | ICD-10-CM | POA: Insufficient documentation

## 2016-09-12 DIAGNOSIS — I2510 Atherosclerotic heart disease of native coronary artery without angina pectoris: Secondary | ICD-10-CM | POA: Diagnosis not present

## 2016-09-12 DIAGNOSIS — I359 Nonrheumatic aortic valve disorder, unspecified: Secondary | ICD-10-CM

## 2016-09-12 DIAGNOSIS — E785 Hyperlipidemia, unspecified: Secondary | ICD-10-CM | POA: Diagnosis not present

## 2016-09-12 DIAGNOSIS — I5022 Chronic systolic (congestive) heart failure: Secondary | ICD-10-CM | POA: Diagnosis not present

## 2016-09-12 DIAGNOSIS — N182 Chronic kidney disease, stage 2 (mild): Secondary | ICD-10-CM

## 2016-09-12 DIAGNOSIS — R0600 Dyspnea, unspecified: Secondary | ICD-10-CM

## 2016-09-12 DIAGNOSIS — R635 Abnormal weight gain: Secondary | ICD-10-CM

## 2016-09-12 DIAGNOSIS — E1122 Type 2 diabetes mellitus with diabetic chronic kidney disease: Secondary | ICD-10-CM

## 2016-09-12 LAB — CBC WITH DIFFERENTIAL/PLATELET
BASOS PCT: 0 %
Basophils Absolute: 0 cells/uL (ref 0–200)
EOS PCT: 4 %
Eosinophils Absolute: 324 cells/uL (ref 15–500)
HEMATOCRIT: 36.2 % — AB (ref 38.5–50.0)
Hemoglobin: 11.5 g/dL — ABNORMAL LOW (ref 13.2–17.1)
LYMPHS PCT: 20 %
Lymphs Abs: 1620 cells/uL (ref 850–3900)
MCH: 28.1 pg (ref 27.0–33.0)
MCHC: 31.8 g/dL — ABNORMAL LOW (ref 32.0–36.0)
MCV: 88.5 fL (ref 80.0–100.0)
MONO ABS: 567 {cells}/uL (ref 200–950)
MONOS PCT: 7 %
MPV: 10.2 fL (ref 7.5–12.5)
Neutro Abs: 5589 cells/uL (ref 1500–7800)
Neutrophils Relative %: 69 %
PLATELETS: 174 10*3/uL (ref 140–400)
RBC: 4.09 MIL/uL — AB (ref 4.20–5.80)
RDW: 16.5 % — AB (ref 11.0–15.0)
WBC: 8.1 10*3/uL (ref 3.8–10.8)

## 2016-09-12 LAB — TSH: TSH: 2.1 mIU/L (ref 0.40–4.50)

## 2016-09-12 LAB — COMPREHENSIVE METABOLIC PANEL
ALT: 21 U/L (ref 9–46)
AST: 24 U/L (ref 10–35)
Albumin: 4.2 g/dL (ref 3.6–5.1)
Alkaline Phosphatase: 72 U/L (ref 40–115)
BILIRUBIN TOTAL: 0.8 mg/dL (ref 0.2–1.2)
BUN: 37 mg/dL — AB (ref 7–25)
CO2: 30 mmol/L (ref 20–31)
CREATININE: 2.09 mg/dL — AB (ref 0.70–1.11)
Calcium: 9.5 mg/dL (ref 8.6–10.3)
Chloride: 103 mmol/L (ref 98–110)
GLUCOSE: 80 mg/dL (ref 65–99)
Potassium: 4.2 mmol/L (ref 3.5–5.3)
SODIUM: 145 mmol/L (ref 135–146)
Total Protein: 6.8 g/dL (ref 6.1–8.1)

## 2016-09-12 LAB — LIPID PANEL
Cholesterol: 110 mg/dL (ref <200)
HDL: 42 mg/dL (ref >40)
LDL CALC: 44 mg/dL (ref <100)
Total CHOL/HDL Ratio: 2.6 Ratio (ref <5.0)
Triglycerides: 119 mg/dL (ref <150)
VLDL: 24 mg/dL (ref <30)

## 2016-09-12 MED ORDER — TORSEMIDE 100 MG PO TABS: ORAL_TABLET | ORAL | 5 refills | Status: DC

## 2016-09-12 NOTE — Patient Instructions (Addendum)
Stop spironolactone. Change torsemide to 100 mg daily (5 x 20 mg or 100 mg). Register with the [place] site. 1-888-[email or web address].

## 2016-09-12 NOTE — Progress Notes (Signed)
Facility  [place]    Place of Service:   OFFICE    No Known Allergies  Chief Complaint  Patient presents with  . Fall    on May 2nd, stepped in a hole and hit his head on a rock, had a big knot on right forehead. Patient refused to go to ED or come see the doctor. Per wife since he fell his memory has decrease and can't think of words to use when talking. Here with wife.    HPI:  Chronic systolic heart failure (HCC) - worse with increase in dyspnea, pedal edema, ascites, and rales bilaterally  Mild memory disturbance - worse since his fall  Stage III chronic kidney disease - needs follow up  Essential hypertension - controlled  Hyperlipidemia, unspecified hyperlipidemia type - needs follow up  Type 2 diabetes mellitus with stage 2 chronic kidney disease, without long-term current use of insulin (HCC) - controlled  Coronary artery disease involving native coronary artery of native heart without angina pectoris - denies chest pain or palpitations  Aortic valve disease - unchanged  Anemia, unspecified type - needs follow up  History of fall - susasttained contusion to the right forehead and had a large ecchymosis and hematoma that has resolved in the last 6 weeks.  Dyspnea, unspecified type - worse. SOB if walking more than 50 feet.  Weight gain - 14# since last visit    Medications: Patient's Medications  New Prescriptions   No medications on file  Previous Medications   ASPIRIN 81 MG TABLET    Take 81 mg by mouth at bedtime.    CARVEDILOL (COREG) 3.125 MG TABLET    TAKE 1 TABLET (3.125 MG TOTAL) BY MOUTH 2 (TWO) TIMES DAILY WITH A MEAL.   DOCUSATE SODIUM (COLACE) 50 MG CAPSULE    Take 50 mg by mouth daily.   ENTRESTO 24-26 MG    TAKE 1 TABLET TWICE A DAY   TO STRENGTHEN THE HEART.   FERROUS SULFATE 325 (65 FE) MG TABLET    TAKE 1 TABLET BY MOUTH 3 TIMES A DAY WITH MEALS   GLIMEPIRIDE (AMARYL) 2 MG TABLET    TAKE ONE TABLET BY MOUTH ONCE DAILY WITH BREAKFAST TO  CONTROL DIABETES   METFORMIN (GLUCOPHAGE) 500 MG TABLET    TAKE 2 TABLETS BY MOUTH TWICE A DAY WITH MEALS   MULTIPLE VITAMIN (MULTIVITAMIN) TABLET    Take 1 tablet by mouth daily.    OMEPRAZOLE (PRILOSEC) 40 MG CAPSULE    TAKE ONE CAPSULE BY MOUTH TWICE A DAY FOR ACID REDUCTION   SIMVASTATIN (ZOCOR) 40 MG TABLET    TAKE ONE TABLET BY MOUTH ONCE DAILY FOR CHOLESTEROL   SPIRONOLACTONE (ALDACTONE) 25 MG TABLET    Take one tablet by mouth every morning to reduce edema and to strengthen the heart   TORSEMIDE (DEMADEX) 20 MG TABLET    Take two tablets twice daily  Modified Medications   No medications on file  Discontinued Medications   No medications on file    Review of Systems  Constitutional: Negative.  Negative for activity change, appetite change, chills, diaphoresis, fatigue and fever.  HENT: Positive for hearing loss. Negative for congestion, ear pain, nosebleeds, rhinorrhea, sinus pressure and tinnitus.        Wears prescription lenses. Otherwise negative in regards to symptoms. Wax in both EAC and impacted in right ear.  Eyes:       Wears prescription lenses. Negative for all other symptoms.  Respiratory:  Positive for shortness of breath. Negative for cough, choking, chest tightness and wheezing.   Cardiovascular: Positive for leg swelling. Negative for chest pain and palpitations.       History of combined systolic and diastolic congestive heart failure.  Gastrointestinal: Negative for abdominal pain, blood in stool, constipation and diarrhea.  Endocrine: Negative for cold intolerance, heat intolerance, polydipsia, polyphagia and polyuria.  Genitourinary: Negative for decreased urine volume, difficulty urinating, dysuria, enuresis, flank pain, frequency and urgency.  Musculoskeletal: Positive for myalgias. Negative for arthralgias, back pain and gait problem.  Skin:       Itch all oveer  Allergic/Immunologic: Negative.   Neurological: Negative for dizziness, tremors, speech  difficulty, weakness, light-headedness, numbness and headaches.       Patient says he is aware that his memory is slipping. Difficulty with word finding. 06/30/2014 MMSE 30/30. Passed clock drawing. Cerebrovascular disease and history of "sun stroke".  Hematological:       Hx of anemia  Psychiatric/Behavioral: Positive for confusion and decreased concentration.       Episodes of agitation and rage per his wife.    Vitals:   09/12/16 1022  BP: 112/76  Pulse: 87  Temp: 97.6 F (36.4 C)  TempSrc: Oral  SpO2: 90%  Weight: 191 lb (86.6 kg)  Height: '5\' 5"'$  (1.651 m)   Body mass index is 31.78 kg/m. Wt Readings from Last 3 Encounters:  09/12/16 191 lb (86.6 kg)  07/10/16 178 lb (80.7 kg)  04/11/16 176 lb (79.8 kg)      Physical Exam  Constitutional: He is oriented to person, place, and time. He appears well-developed and well-nourished.  HENT:  Significant hearing loss bilaterally. Interferes with conversation.  Eyes:  Wears prescription lenses, otherwise normal. Right eye is deviated medially.  Neck: Neck supple. No JVD present. No tracheal deviation present. No thyromegaly present.  Cardiovascular: Normal rate, regular rhythm, normal heart sounds and intact distal pulses.  Exam reveals no gallop.   No murmur heard. Pulmonary/Chest: No respiratory distress. He has no wheezes. He has rales (Moist rales in bibasilar lung areas). He exhibits no tenderness.  Diminished sounds in the right lung.  Abdominal: Soft. Bowel sounds are normal. He exhibits no mass. There is no tenderness.  Genitourinary:  Genitourinary Comments: Prior penile edema has resolved.  Musculoskeletal: He exhibits edema (3+ bipedal).  Chronic back discomfort with movement.  Lymphadenopathy:    He has no cervical adenopathy.  Neurological: He is alert and oriented to person, place, and time. No cranial nerve deficit. Coordination normal.  Wife has reported issues with his memory in the past. He now says that  he is aware of some memory slippage. He does not feel that it incapacitates him in any way. Difficulty with word finding. 07/10/16 MMSE 26/30. Passed clock drawing.  Skin: Skin is warm and dry. No rash noted. No erythema.  Bruise under the right eye.  Psychiatric: He has a normal mood and affect. His behavior is normal. Judgment and thought content normal.    Labs reviewed: Lab Summary Latest Ref Rng & Units 07/10/2016 04/11/2016  Hemoglobin 13.2 - 17.1 g/dL 11.9(L) 12.0(L)  Hematocrit 38.5 - 50.0 % 37.8(L) 38.3(L)  White count 3.8 - 10.8 K/uL 7.9 11.7(H)  Platelet count 140 - 400 K/uL 216 300  Sodium 135 - 146 mmol/L 141 144  Potassium 3.5 - 5.3 mmol/L 4.7 4.8  Calcium 8.6 - 10.3 mg/dL 9.7 9.9  Phosphorus - (None) (None)  Creatinine 0.70 - 1.11 mg/dL 2.14(H) 1.86(H)  AST 10 - 35 U/L 25 22  Alk Phos 40 - 115 U/L 53 69  Bilirubin 0.2 - 1.2 mg/dL 0.8 0.6  Glucose 65 - 99 mg/dL 123(H) 138(H)  Cholesterol <200 mg/dL (None) 123  HDL cholesterol >40 mg/dL (None) 42  Triglycerides <150 mg/dL (None) 100  LDL Direct - (None) (None)  LDL Calc <100 mg/dL (None) 61  Total protein 6.1 - 8.1 g/dL 7.3 7.6  Albumin 3.6 - 5.1 g/dL 4.5 4.2  Some recent data might be hidden   Lab Results  Component Value Date   TSH 2.626 11/16/2015   TSH 2.092 03/07/2015   TSH 1.454 *Test methodology is 3rd generation TSH* 06/27/2007   Lab Results  Component Value Date   BUN 35 (H) 07/10/2016   BUN 33 (H) 04/11/2016   BUN 34 (H) 01/04/2016   Lab Results  Component Value Date   HGBA1C 6.4 (H) 07/10/2016   HGBA1C 6.5 (H) 04/11/2016   HGBA1C 7.0 (H) 11/16/2015    Assessment/Plan  1. Chronic systolic heart failure (HCC) Worse-  - Stop spironolactone - continue Entresto - torsemide (DEMADEX) 100 MG tablet; One daily for diuretic  Dispense: 60 tablet; Refill: 5  2. Mild memory disturbance Worse since the fall  3. Stage III chronic kidney disease - Comprehensive metabolic panel  4. Essential  hypertension - Comprehensive metabolic panel  5. Hyperlipidemia, unspecified hyperlipidemia type - Lipid panel  6. Type 2 diabetes mellitus with stage 2 chronic kidney disease, without long-term current use of insulin (HCC) The current medical regimen is effective;  continue present plan and medications. - Comprehensive metabolic panel - Hemoglobin A1c  7. Coronary artery disease involving native coronary artery of native heart without angina pectoris stable  8. Aortic valve disease unchanged  9. Anemia, unspecified type - CBC with Differential/Platelet  10. History of fall Unstable gait. High risk for falls.  11. Dyspnea, unspecified type worse - Brain natriuretic peptide  12. Weight gain Fluid retention - TSH

## 2016-09-13 LAB — HEMOGLOBIN A1C
HEMOGLOBIN A1C: 6.6 % — AB (ref <5.7)
Mean Plasma Glucose: 143 mg/dL

## 2016-09-13 LAB — BRAIN NATRIURETIC PEPTIDE

## 2016-09-18 ENCOUNTER — Inpatient Hospital Stay (HOSPITAL_COMMUNITY)
Admission: AD | Admit: 2016-09-18 | Discharge: 2016-09-24 | DRG: 291 | Disposition: A | Discharge status: Skilled Nursing Facility | Payer: Medicare Other | Source: Ambulatory Visit | Attending: Cardiology | Admitting: Cardiology

## 2016-09-18 ENCOUNTER — Encounter (HOSPITAL_COMMUNITY): Payer: Self-pay | Admitting: General Practice

## 2016-09-18 DIAGNOSIS — Z8249 Family history of ischemic heart disease and other diseases of the circulatory system: Secondary | ICD-10-CM | POA: Diagnosis not present

## 2016-09-18 DIAGNOSIS — I5023 Acute on chronic systolic (congestive) heart failure: Secondary | ICD-10-CM | POA: Diagnosis not present

## 2016-09-18 DIAGNOSIS — E1122 Type 2 diabetes mellitus with diabetic chronic kidney disease: Secondary | ICD-10-CM | POA: Diagnosis present

## 2016-09-18 DIAGNOSIS — K219 Gastro-esophageal reflux disease without esophagitis: Secondary | ICD-10-CM | POA: Diagnosis present

## 2016-09-18 DIAGNOSIS — I472 Ventricular tachycardia: Secondary | ICD-10-CM | POA: Diagnosis present

## 2016-09-18 DIAGNOSIS — I130 Hypertensive heart and chronic kidney disease with heart failure and stage 1 through stage 4 chronic kidney disease, or unspecified chronic kidney disease: Secondary | ICD-10-CM | POA: Diagnosis present

## 2016-09-18 DIAGNOSIS — Z833 Family history of diabetes mellitus: Secondary | ICD-10-CM | POA: Diagnosis not present

## 2016-09-18 DIAGNOSIS — I255 Ischemic cardiomyopathy: Secondary | ICD-10-CM | POA: Diagnosis present

## 2016-09-18 DIAGNOSIS — N400 Enlarged prostate without lower urinary tract symptoms: Secondary | ICD-10-CM | POA: Diagnosis present

## 2016-09-18 DIAGNOSIS — F0390 Unspecified dementia without behavioral disturbance: Secondary | ICD-10-CM | POA: Diagnosis present

## 2016-09-18 DIAGNOSIS — E785 Hyperlipidemia, unspecified: Secondary | ICD-10-CM | POA: Diagnosis present

## 2016-09-18 DIAGNOSIS — R0600 Dyspnea, unspecified: Secondary | ICD-10-CM | POA: Diagnosis present

## 2016-09-18 DIAGNOSIS — Z8673 Personal history of transient ischemic attack (TIA), and cerebral infarction without residual deficits: Secondary | ICD-10-CM

## 2016-09-18 DIAGNOSIS — I350 Nonrheumatic aortic (valve) stenosis: Secondary | ICD-10-CM | POA: Diagnosis present

## 2016-09-18 DIAGNOSIS — N184 Chronic kidney disease, stage 4 (severe): Secondary | ICD-10-CM | POA: Diagnosis present

## 2016-09-18 DIAGNOSIS — I700 Atherosclerosis of aorta: Secondary | ICD-10-CM | POA: Diagnosis present

## 2016-09-18 DIAGNOSIS — Z7984 Long term (current) use of oral hypoglycemic drugs: Secondary | ICD-10-CM | POA: Diagnosis not present

## 2016-09-18 DIAGNOSIS — Z7982 Long term (current) use of aspirin: Secondary | ICD-10-CM

## 2016-09-18 DIAGNOSIS — I2510 Atherosclerotic heart disease of native coronary artery without angina pectoris: Secondary | ICD-10-CM | POA: Diagnosis present

## 2016-09-18 DIAGNOSIS — Z9119 Patient's noncompliance with other medical treatment and regimen: Secondary | ICD-10-CM

## 2016-09-18 HISTORY — DX: Anxiety disorder, unspecified: F41.9

## 2016-09-18 HISTORY — DX: Unspecified malignant neoplasm of skin of nose: C44.301

## 2016-09-18 HISTORY — DX: Depression, unspecified: F32.A

## 2016-09-18 HISTORY — DX: Essential (primary) hypertension: I10

## 2016-09-18 HISTORY — DX: Major depressive disorder, single episode, unspecified: F32.9

## 2016-09-18 LAB — GLUCOSE, CAPILLARY: GLUCOSE-CAPILLARY: 134 mg/dL — AB (ref 65–99)

## 2016-09-18 LAB — TSH: TSH: 1.597 u[IU]/mL (ref 0.350–4.500)

## 2016-09-18 LAB — CBC WITH DIFFERENTIAL/PLATELET
Basophils Absolute: 0 10*3/uL (ref 0.0–0.1)
Basophils Relative: 0 %
Eosinophils Absolute: 0.3 10*3/uL (ref 0.0–0.7)
Eosinophils Relative: 4 %
HCT: 37.3 % — ABNORMAL LOW (ref 39.0–52.0)
Hemoglobin: 11.3 g/dL — ABNORMAL LOW (ref 13.0–17.0)
LYMPHS ABS: 1.2 10*3/uL (ref 0.7–4.0)
LYMPHS PCT: 17 %
MCH: 27.9 pg (ref 26.0–34.0)
MCHC: 30.3 g/dL (ref 30.0–36.0)
MCV: 92.1 fL (ref 78.0–100.0)
Monocytes Absolute: 0.6 10*3/uL (ref 0.1–1.0)
Monocytes Relative: 8 %
NEUTROS PCT: 71 %
Neutro Abs: 5.2 10*3/uL (ref 1.7–7.7)
Platelets: 151 10*3/uL (ref 150–400)
RBC: 4.05 MIL/uL — AB (ref 4.22–5.81)
RDW: 17 % — ABNORMAL HIGH (ref 11.5–15.5)
WBC: 7.2 10*3/uL (ref 4.0–10.5)

## 2016-09-18 LAB — COMPREHENSIVE METABOLIC PANEL
ALBUMIN: 4 g/dL (ref 3.5–5.0)
ALT: 27 U/L (ref 17–63)
ANION GAP: 12 (ref 5–15)
AST: 32 U/L (ref 15–41)
Alkaline Phosphatase: 77 U/L (ref 38–126)
BILIRUBIN TOTAL: 0.6 mg/dL (ref 0.3–1.2)
BUN: 40 mg/dL — AB (ref 6–20)
CHLORIDE: 102 mmol/L (ref 101–111)
CO2: 29 mmol/L (ref 22–32)
Calcium: 9.3 mg/dL (ref 8.9–10.3)
Creatinine, Ser: 2 mg/dL — ABNORMAL HIGH (ref 0.61–1.24)
GFR calc Af Amer: 33 mL/min — ABNORMAL LOW (ref >60)
GFR calc non Af Amer: 29 mL/min — ABNORMAL LOW (ref >60)
GLUCOSE: 95 mg/dL (ref 65–99)
POTASSIUM: 3.7 mmol/L (ref 3.5–5.1)
SODIUM: 143 mmol/L (ref 135–145)
TOTAL PROTEIN: 6.9 g/dL (ref 6.5–8.1)

## 2016-09-18 LAB — BRAIN NATRIURETIC PEPTIDE: B Natriuretic Peptide: >4500 pg/mL — ABNORMAL HIGH (ref 0.0–100.0)

## 2016-09-18 MED ORDER — FUROSEMIDE 10 MG/ML IJ SOLN
80.0000 mg | Freq: Four times a day (QID) | INTRAMUSCULAR | Status: DC
  Administered 2016-09-18 – 2016-09-20 (×7): 80 mg via INTRAVENOUS
  Filled 2016-09-18 (×7): qty 8

## 2016-09-18 MED ORDER — ENOXAPARIN SODIUM 30 MG/0.3ML ~~LOC~~ SOLN
30.0000 mg | SUBCUTANEOUS | Status: DC
  Administered 2016-09-18 – 2016-09-23 (×6): 30 mg via SUBCUTANEOUS
  Filled 2016-09-18 (×6): qty 0.3

## 2016-09-18 MED ORDER — ONDANSETRON HCL 4 MG/2ML IJ SOLN: 4.0000 mg | Freq: Four times a day (QID) | INTRAMUSCULAR | Status: DC | PRN

## 2016-09-18 MED ORDER — CARVEDILOL 3.125 MG PO TABS
3.1250 mg | ORAL_TABLET | Freq: Two times a day (BID) | ORAL | Status: DC
  Filled 2016-09-18: qty 1

## 2016-09-18 MED ORDER — ASPIRIN 81 MG PO CHEW
81.0000 mg | CHEWABLE_TABLET | Freq: Every day | ORAL | Status: DC
  Administered 2016-09-19 – 2016-09-23 (×5): 81 mg via ORAL
  Filled 2016-09-18 (×5): qty 1

## 2016-09-18 MED ORDER — SODIUM CHLORIDE 0.9% FLUSH
3.0000 mL | Freq: Two times a day (BID) | INTRAVENOUS | Status: DC
  Administered 2016-09-18 – 2016-09-24 (×11): 3 mL via INTRAVENOUS

## 2016-09-18 MED ORDER — SACUBITRIL-VALSARTAN 24-26 MG PO TABS
1.0000 | ORAL_TABLET | Freq: Two times a day (BID) | ORAL | Status: DC
  Administered 2016-09-18 – 2016-09-24 (×9): 1 via ORAL
  Filled 2016-09-18 (×12): qty 1

## 2016-09-18 MED ORDER — DOCUSATE SODIUM 50 MG PO CAPS
50.0000 mg | ORAL_CAPSULE | Freq: Every day | ORAL | Status: DC
  Administered 2016-09-19 – 2016-09-24 (×6): 50 mg via ORAL
  Filled 2016-09-18 (×6): qty 1

## 2016-09-18 MED ORDER — INSULIN ASPART 100 UNIT/ML ~~LOC~~ SOLN
0.0000 [IU] | Freq: Three times a day (TID) | SUBCUTANEOUS | Status: DC
  Administered 2016-09-19 (×2): 2 [IU] via SUBCUTANEOUS
  Administered 2016-09-20: 3 [IU] via SUBCUTANEOUS
  Administered 2016-09-21: 2 [IU] via SUBCUTANEOUS
  Administered 2016-09-22 – 2016-09-23 (×2): 1 [IU] via SUBCUTANEOUS

## 2016-09-18 MED ORDER — INSULIN ASPART 100 UNIT/ML ~~LOC~~ SOLN: 0.0000 [IU] | Freq: Three times a day (TID) | SUBCUTANEOUS | Status: DC

## 2016-09-18 MED ORDER — SODIUM CHLORIDE 0.9% FLUSH: 3.0000 mL | INTRAVENOUS | Status: DC | PRN

## 2016-09-18 MED ORDER — GLIMEPIRIDE 4 MG PO TABS
2.0000 mg | ORAL_TABLET | Freq: Every day | ORAL | Status: DC
  Administered 2016-09-19 – 2016-09-24 (×6): 2 mg via ORAL
  Filled 2016-09-18 (×6): qty 1

## 2016-09-18 MED ORDER — CARVEDILOL 3.125 MG PO TABS
3.1250 mg | ORAL_TABLET | Freq: Two times a day (BID) | ORAL | Status: DC
  Administered 2016-09-19 – 2016-09-24 (×10): 3.125 mg via ORAL
  Filled 2016-09-18 (×11): qty 1

## 2016-09-18 MED ORDER — GLIMEPIRIDE 4 MG PO TABS: 2.0000 mg | ORAL_TABLET | Freq: Every day | ORAL | Status: DC

## 2016-09-18 MED ORDER — PANTOPRAZOLE SODIUM 40 MG PO TBEC
40.0000 mg | DELAYED_RELEASE_TABLET | Freq: Every day | ORAL | Status: DC
  Administered 2016-09-19 – 2016-09-24 (×6): 40 mg via ORAL
  Filled 2016-09-18 (×6): qty 1

## 2016-09-18 MED ORDER — SODIUM CHLORIDE 0.9 % IV SOLN: 250.0000 mL | INTRAVENOUS | Status: DC | PRN

## 2016-09-18 NOTE — H&P (Addendum)
Jimmy Dawson    Date of visit:  09/18/2016 DOB:  [birth year]    Age:  83 yrs. Medical record number:  [id number]     Account number:  [id number] Primary Care Provider: GREEN III, ARTHUR G ____________________________ CURRENT DIAGNOSES  1. Dyspnea  2. Acute on chronic systolic heart failure  3. Hypertensive heart disease without heart failure  4. CAD Native without angina  5. Aortic valve disorder, unspecified  6. Hyperlipidemia  7. Chronic kidney disease, stage 3 (moderate)  8. Type 2 diabetes mellitus without complications  9. Presence of coronary angioplasty implant and graft  10. Gastro-esophageal reflux disease without esophagitis ____________________________ ALLERGIES  No Known Allergies ____________________________ MEDICATIONS  1. acetaminophen 500 mg tablet, PRN  2. aspirin 81 mg chewable tablet, 1 p.o. daily  3. carvedilol 3.125 mg tablet, BID  4. Colace 100 mg capsule, PRN  5. Corlanor 5 mg tablet, BID  6. diphenhydramine 25 mg tablet, PRN  7. doxycycline monohydrate 100 mg tablet, take 2 tablets one first day and then one daily  8. Entresto 24 mg-26 mg tablet, BID  9. ferrous sulfate 325 mg (65 mg iron) tablet, TID  10. glimepiride 2 mg tablet, 1 p.o. daily  11. metformin 500 mg tablet, 2 p.o. b.i.d.  12. multivitamin Tablet, 1 p.o. daily  13. omeprazole 40 mg capsule,delayed release, BID  14. simvastatin 40 mg tablet, 1 p.o. daily  15. spironolactone 25 mg tablet, 1 p.o. daily  16. torsemide 20 mg tablet, two tablets twice a day ____________________________ CHIEF COMPLAINTS  Dyspnea at rest ____________________________ HISTORY OF PRESENT ILLNESS Patient returns for cardiac followup. He had a mechanical fall in May and saw his primary doctor last week. He has had increasing confusion as well as memory difficulties since the fall and has had worsening dyspnea. He was noted to have worsening edema despite taking his medicines. His primary physician stopped his  spironolactone and changed him to torsemide 100 mg once in the morning. Lab work showed a BNP of greater than 4200. He presented to my office today with a 31 pound weight gain since his previous visit just one month ago. He is having worsening shortness of breath but no chest pain and is also having some mild orthopnea and PND as well as generalized abdominal swelling. It is difficult to tell how compliant he has been with his medicines. He has clearly had worsening dementia and memory loss since his visit here. Because of failure to improve in his breathing and failure to diurese as an outpatient he is admitted at this time for inpatient treatment of congestive heart failure.  ____________________________ PAST HISTORY  Past Medical Illnesses:  hypertension, DM-non-insulin dependent, hyperlipidemia, obesity, GERD, BPH, TIA, chronic kidney disease Stage 3, dementia;  Cardiovascular Illnesses:  chronic systolic heart failure, CAD;  Surgical Procedures:  TURP;  NYHA Classification:  II;  Canadian Angina Classification:  Class 0: Asymptomatic;  Cardiology Procedures-Invasive:  Penta stent February 2002 Dr. Glade Lloyd, cardiac cath (left) February 2016;  Cardiology Procedures-Noninvasive:  echocardiogram August 2017;  Cardiac Cath Results:  40% ostiall Left main, 40% proximal LAD, patent stent mid LAD, 95% stenosis proximal Diag 1, scattered irregularities CFX, scattered irregularities RCA;  LVEF of 20% documented via echocardiogram on 11/17/2015,   ____________________________ CARDIO-PULMONARY TEST DATES EKG Date:  11/16/2015;   Cardiac Cath Date:  05/24/2014;  Stent Placement Date: 05/08/2000;  Nuclear Study Date:  05/06/2014;  Echocardiography Date: 11/17/2015;  Chest Xray Date: 03/07/2015;   ____________________________ FAMILY HISTORY Brother -- Brother  dead, Myocardial infarction Brother -- Brother dead, Unknown disease Brother -- Brother dead, Unknown disease Father -- Father dead, Myocardial  infarction Mother -- Mother dead, Diabetes mellitus, Coronary Artery Disease ____________________________ SOCIAL HISTORY Alcohol Use:  does not use alcohol;  Smoking:  nonsmoker;  Diet:  low sodium (less than 2 grams);  Lifestyle:  widower and remarried 12 years;  Exercise:  exercise is limited due to physical disability;  Occupation:  Park Ranger-retired;  Residence:  lives with wife;   ____________________________ REVIEW OF SYSTEMS General:  malaise and fatigue, no change in weight  Integumentary:easy bruisability Eyes: decreased acuity O.D. Ears, Nose, Throat, Mouth:  partial hearing loss Respiratory: productive cough, dyspnea with exertion Cardiovascular:  please review HPI Abdominal: denies dyspepsia, GI bleeding, constipation, or diarrhea Genitourinary-Male: nocturia  Musculoskeletal:  chronic low back pain Neurological:  memory loss, recent fall  ____________________________ PHYSICAL EXAMINATION VITAL SIGNS  Blood Pressure:  118/78 Sitting, Right arm, regular cuff  , 124/80 Standing, Right arm and regular cuff   Pulse:  92/min. Weight:  197.00 lbs. Height:  65"BMI: 33  Constitutional:  Elderly white male, hard of hearing and mildly confused Skin:  scattered ecchyymoses Head:  normocephalic, normal hair pattern, no masses or tenderness Eyes:  strabismus ENT:  bilateral hearing aides present, very HOH Neck:  supple, without massess. No JVD, thyromegaly or carotid bruits. Carotid upstroke normal. Chest:  Rhonchi, rales at bases Cardiac:  regular rhythm, normal S1 and S2, no murmur, grade 2/6 systolic murmur at aortic area radiating to neck Abdomen:  Abdomen distended soft, nontender Peripheral Pulses:  femoral pulses 2+, posterior tibial pulses 3+ Extremities & Back:  mild bilateral venous insufficiency changes present, 3+ edema present Psychiatric:  oriented to time, oriented to person Neurological:  no gross motor or sensory deficits noted, affect appropriate, mildly  confused. ____________________________ MOST RECENT LIPID PANEL 10/17/15  CHOL TOTL 105 mg/dl, LDL 52 NM, HDL 38 mg/dl, TRIGLYCER 77 mg/dl, CHOL/HDL 2.8 (Calc) and VLDL 15 ____________________________ IMPRESSIONS/PLAN  1. Acute on chronic systolic heart failure with 31 pound weight gain 2. Ischemic cardiomyopathy 3. Mild to moderate aortic stenosis 4. Stage 3-4 chronic kidney disease 5. Dementia 6. CAD with previous stents  Recommendations:  Patient is admitted for treatment of congestive heart failure. He will be given intravenous diuresis and will continue his heart failure medications. Obtain repeat echocardiogram as well as chest x-ray. ____________________________ Cardiology Physician:  Kerry Hough MD [facility]

## 2016-09-19 ENCOUNTER — Inpatient Hospital Stay (HOSPITAL_COMMUNITY): Payer: Medicare Other

## 2016-09-19 LAB — ECHOCARDIOGRAM COMPLETE
Height: 66 in
WEIGHTICAEL: 2982.4 [oz_av]

## 2016-09-19 LAB — GLUCOSE, CAPILLARY
GLUCOSE-CAPILLARY: 105 mg/dL — AB (ref 65–99)
Glucose-Capillary: 155 mg/dL — ABNORMAL HIGH (ref 65–99)
Glucose-Capillary: 168 mg/dL — ABNORMAL HIGH (ref 65–99)

## 2016-09-19 LAB — BASIC METABOLIC PANEL
Anion gap: 10 (ref 5–15)
BUN: 36 mg/dL — AB (ref 6–20)
CHLORIDE: 100 mmol/L — AB (ref 101–111)
CO2: 31 mmol/L (ref 22–32)
CREATININE: 1.94 mg/dL — AB (ref 0.61–1.24)
Calcium: 9.2 mg/dL (ref 8.9–10.3)
GFR calc Af Amer: 34 mL/min — ABNORMAL LOW (ref >60)
GFR calc non Af Amer: 30 mL/min — ABNORMAL LOW (ref >60)
Glucose, Bld: 126 mg/dL — ABNORMAL HIGH (ref 65–99)
POTASSIUM: 3.7 mmol/L (ref 3.5–5.1)
Sodium: 141 mmol/L (ref 135–145)

## 2016-09-19 LAB — HEMOGLOBIN A1C
Hgb A1c MFr Bld: 6.8 % — ABNORMAL HIGH (ref 4.8–5.6)
MEAN PLASMA GLUCOSE: 148 mg/dL

## 2016-09-19 LAB — MAGNESIUM: Magnesium: 1.8 mg/dL (ref 1.7–2.4)

## 2016-09-19 MED ORDER — SPIRONOLACTONE 25 MG PO TABS
25.0000 mg | ORAL_TABLET | Freq: Every day | ORAL | Status: DC
  Administered 2016-09-19 – 2016-09-24 (×6): 25 mg via ORAL
  Filled 2016-09-19 (×6): qty 1

## 2016-09-19 NOTE — Progress Notes (Signed)
  Echocardiogram 2D Echocardiogram has been performed.  Jimmy Dawson 09/19/2016, 11:22 AM

## 2016-09-19 NOTE — Progress Notes (Signed)
Subjective:  Feels better today.  Breathing is better but still dyspneic with activity.  No complaints of chest pain.  Reasonable diuresis overnight.  Weight already down  Objective:  Vital Signs in the last 24 hours: BP 126/88 (BP Location: Right Arm)   Pulse 92   Temp 97.8 F (36.6 C) (Oral)   Resp 20   Ht 5\' 6"  (1.676 m)   Wt 84.6 kg (186 lb 6.4 oz)   SpO2 97%   BMI 30.09 kg/m   Physical Exam: Elderly male in no acute distress, appears more comfortable today Lungs:  Reduced breath sounds at the bases with mild rales Cardiac:  Regular rhythm, normal S1 and S2, no S3 Abdomen:  Soft, nontender, no masses Extremities:  3+ edema present  Intake/Output from previous day: 06/19 0701 - 06/20 0700 In: 480 [P.O.:480] Out: 3250 [Urine:3250] Weight Filed Weights   09/18/16 1539 09/18/16 1554 09/19/16 0427  Weight: 89.5 kg (197 lb 6 oz) 89.5 kg (197 lb 6 oz) 84.6 kg (186 lb 6.4 oz)    Lab Results: Basic Metabolic Panel:  Recent Labs  09/18/16 1810 09/19/16 0254  NA 143 141  K 3.7 3.7  CL 102 100*  CO2 29 31  GLUCOSE 95 126*  BUN 40* 36*  CREATININE 2.00* 1.94*    CBC:  Recent Labs  09/18/16 1810  WBC 7.2  NEUTROABS 5.2  HGB 11.3*  HCT 37.3*  MCV 92.1  PLT 151    BNP    Component Value Date/Time   BNP >4,500.0 (H) 09/18/2016 1810   BNP >4200.0 (H) 09/12/2016 1113   Telemetry: Normal sinus rhythm, personally reviewed by me  Assessment/Plan:  1.  Acute on chronic systolic heart failure 2.  Mild-to-moderate aortic stenosis 3.  CAD without angina 4.  Chronic kidney disease stage III-IV some improvement today 5.  Dementia with some compliance issues at home  Recommendations:  Continue intravenous diuresis.  Add spironolactone back to regimen.  Continue to monitor renal function and diuresis.  Try to get home health involved again as home situation is somewhat difficult.      Kerry Hough  MD [facility] Card[facility]  09/19/2016, 8:52 AM

## 2016-09-19 NOTE — Progress Notes (Signed)
I stopped in to speak with Jimmy Dawson regarding his HF and recommendations for home.  He was sound asleep and I will plan to return when he is awake.

## 2016-09-19 NOTE — Progress Notes (Signed)
24 beat of VTach, pt lying in in bed asymptomatic. BP 104/78 HR 83. Dr. Wynonia Lawman made aware.

## 2016-09-19 NOTE — Care Management Note (Signed)
Case Management Note  Patient Details  Name: Jimmy Dawson MRN: [id number] Date of Birth: 1928/11/16  Subjective/Objective:    Admitted with CHF               Action/Plan: Patient lives at home with spouse; Primary Care Provider: Waldon Reining; has private insurance with Medicare / BCBS with prescription drug coverage; pharmacy of choice is [facility]; Patient could benefit from [facility] for Disease Management for CHF; [place] choice offered, pt chose [facility]; [place] with [facility] called for arrangements. Patient has scales at home and knows to weigh himself daily and states that he eats out a lot ( [employer], [employer]); CM talked to patient about the added salt that resturants use; he is agreeable to a Nutritional consult for making wise food choices; also physical therapy to eval; CM will continue to follow for DCP  Expected Discharge Date:      Possibly 09/24/2016            Expected Discharge Plan:  [place]  In-House Referral:   Nutritional Consult, Physical Therapy  Discharge planning Services  CM Consult   Choice offered to:  Patient  HH Arranged:  RN, Disease Management, PT [place] Agency:  [facility]  Status of Service:  In process, will continue to follow  Sherrilyn Rist 09/19/2016, 9:52 AM

## 2016-09-19 NOTE — Progress Notes (Signed)
Pt with 7 beats of V tach last night. Pt asymptomatic and resting comfortably. BMET already ordered for AM labs. MD paged. Orders received for Magnesium level. Mag = 1.8. EKG performed for this AM and in chart. Will continue to monitor.

## 2016-09-19 NOTE — Plan of Care (Signed)
Problem: Food- and Nutrition-Related Knowledge Deficit (NB-1.1) Goal: Nutrition education Formal process to instruct or train a patient/client in a skill or to impart knowledge to help patients/clients voluntarily manage or modify food choices and eating behavior to maintain or improve health. Outcome: Completed/Met Date Met: 09/19/16 Nutrition Education Note  RD consulted for nutrition education regarding low sodium. Noted pt with hx of dementia possibly contributing to his non-compliance. Pt in bed eating lunch without problems. He is very slow to respond. He reports that his wife does the cooking and shopping. He does not use a salt shaker at home but they do cook a lot of frozen items and eat out a lot. Wife not in room. Pt thinks she will be here tomorrow but is unsure. Left materials at bedside with contact information.   RD provided "Low Sodium Nutrition Therapy" handout from the Academy of Nutrition and Dietetics. Reviewed patient's dietary recall. Provided examples on ways to decrease sodium intake in diet. Discouraged intake of processed foods and use of salt shaker. Encouraged fresh fruits and vegetables as well as whole grain sources of carbohydrates to maximize fiber intake.   RD discussed why it is important for patient to adhere to diet recommendations, and emphasized the role of fluids, foods to avoid, and importance of weighing self daily. Teach back method used.  Expect poor-fair compliance.  Body mass index is 30.09 kg/m. Pt meets criteria for obesity class I based on current BMI.  Current diet order is 2gm Na, patient is consuming approximately 100% of meals at this time. Labs and medications reviewed. No further nutrition interventions warranted at this time. RD contact information provided. If additional nutrition issues arise, please re-consult RD.   [place], [place], [place] Pager [phone] After Hours Pager

## 2016-09-19 NOTE — Progress Notes (Signed)
Patient alarmed 21 beats v-tach, however QRS not wide enough to be v-tach.  Dr. Wynonia Lawman made aware. Will continue to monitor.

## 2016-09-20 ENCOUNTER — Ambulatory Visit: Payer: Federal, State, Local not specified - PPO | Admitting: Nurse Practitioner

## 2016-09-20 LAB — GLUCOSE, CAPILLARY
GLUCOSE-CAPILLARY: 102 mg/dL — AB (ref 65–99)
GLUCOSE-CAPILLARY: 67 mg/dL (ref 65–99)
Glucose-Capillary: 181 mg/dL — ABNORMAL HIGH (ref 65–99)
Glucose-Capillary: 237 mg/dL — ABNORMAL HIGH (ref 65–99)
Glucose-Capillary: 77 mg/dL (ref 65–99)
Glucose-Capillary: 81 mg/dL (ref 65–99)

## 2016-09-20 LAB — BASIC METABOLIC PANEL
ANION GAP: 9 (ref 5–15)
BUN: 28 mg/dL — ABNORMAL HIGH (ref 6–20)
CHLORIDE: 98 mmol/L — AB (ref 101–111)
CO2: 32 mmol/L (ref 22–32)
Calcium: 9 mg/dL (ref 8.9–10.3)
Creatinine, Ser: 1.66 mg/dL — ABNORMAL HIGH (ref 0.61–1.24)
GFR calc non Af Amer: 36 mL/min — ABNORMAL LOW (ref >60)
GFR, EST AFRICAN AMERICAN: 41 mL/min — AB (ref >60)
Glucose, Bld: 109 mg/dL — ABNORMAL HIGH (ref 65–99)
Potassium: 3.3 mmol/L — ABNORMAL LOW (ref 3.5–5.1)
Sodium: 139 mmol/L (ref 135–145)

## 2016-09-20 MED ORDER — FUROSEMIDE 10 MG/ML IJ SOLN
80.0000 mg | Freq: Three times a day (TID) | INTRAMUSCULAR | Status: DC
  Administered 2016-09-20 – 2016-09-21 (×3): 80 mg via INTRAVENOUS
  Filled 2016-09-20 (×3): qty 8

## 2016-09-20 MED ORDER — POTASSIUM CHLORIDE 20 MEQ/15ML (10%) PO SOLN
40.0000 meq | Freq: Once | ORAL | Status: AC
  Administered 2016-09-20: 40 meq via ORAL
  Filled 2016-09-20: qty 30

## 2016-09-20 NOTE — Progress Notes (Signed)
Heart Failure Navigator Consult Note  Presentation: Jimmy Dawson is a 83 year old patient of Jimmy Dawson. Per Jimmy Dawson note--He had a mechanical fall in May and saw his primary doctor last week. He has had increasing confusion as well as memory difficulties since the fall and has had worsening dyspnea. He was noted to have worsening edema despite taking his medicines. His primary physician stopped his spironolactone and changed him to torsemide 100 mg once in the morning. Lab work showed a BNP of greater than 4200. He presented to my office today with a 31 pound weight gain since his previous visit just one month ago. He is having worsening shortness of breath but no chest pain and is also having some mild orthopnea and PND as well as generalized abdominal swelling. It is difficult to tell how compliant he has been with his medicines. He has clearly had worsening dementia and memory loss since his visit here. Because of failure to improve in his breathing and failure to diurese as an outpatient he is admitted at this time for inpatient treatment of congestive heart failure.   Past Medical History:  Diagnosis Date  . Anxiety   . Aortic valve disease   . CAD (coronary artery disease)   . CAP (community acquired pneumonia) 09/2013   Jimmy Dawson 10/03/2013  . CHF (congestive heart failure) ([place])   . Chronic kidney disease (CKD), stage III (moderate)   . Depression   . Diastasis of muscle   . DM (diabetes mellitus), type 2, uncontrolled, with renal complications ([place]) 0/25/8527  . Elevated prostate specific antigen (PSA)   . GERD (gastroesophageal reflux disease)   . Hyperlipidemia   . Hypertension   . Hypertensive heart disease   . Low back pain 04/11/2016  . Pneumonia    "small case"  . Skin cancer of nose   . Sun stroke ~ 2014   "haven't had any strength since"  . Sun stroke    "many years back" (09/18/2016)  . Tuberculosis    "when I was a child they said I'd had a spot on my lungs & that it  was TB but it was ok then"  . Type II diabetes mellitus ([place])   . Unspecified disorder of kidney and ureter     Social History   Social History  . Marital status: Married    Spouse name: Jimmy Dawson  . Number of children: 2  . Years of education: 16   Occupational History  .      retired Surveyor, mining   Social History Main Topics  . Smoking status: Former Smoker    Packs/day: 0.50    Years: 1.00    Types: Cigarettes    Quit date: 04/02/1968  . Smokeless tobacco: Former Systems developer    Types: Chew     Comment: "chewed very little'  . Alcohol use 3.6 oz/week    6 Cans of beer per week  . Drug use: No  . Sexual activity: No   Other Topics Concern  . None   Social History Narrative   Married, live in [place] home, lives w/ spouse, has a living well, retired Surveyor, mining. Does not use caffeine, exerise walk. Has dog.    ECHO: Study Conclusions-09/19/16  - Left ventricle: The cavity size was normal. Systolic function was   severely reduced. The estimated ejection fraction was in the   range of 15% to 20%. Diffuse hypokinesis. - Aortic valve: Trileaflet; moderately thickened, mildly calcified  leaflets. Valve mobility was mildly restricted. There was mild   stenosis. Valve area (VTI): 1.8 cm^2. Valve area (Vmax): 2.72   cm^2. Valve area (Vmean): 1.88 cm^2. - Mitral valve: There was mild regurgitation. Valve area by   continuity equation (using LVOT flow): 5.66 cm^2. - Left atrium: The atrium was severely dilated. - Right ventricle: The cavity size was dilated. Systolic function   was mildly reduced. - Right atrium: The atrium was mildly to moderately dilated. - Tricuspid valve: There was moderate regurgitation. - Pulmonary arteries: Systolic pressure was moderately increased.   PA peak pressure: 52 mm Hg (S).  BNP    Component Value Date/Time   BNP >4,500.0 (H) 09/18/2016 1810   BNP >4200.0 (H) 09/12/2016 1113    ProBNP    Component Value Date/Time   PROBNP 1,639.0 (H)  10/05/2013 0232     Education Assessment and Provision:  Detailed education and instructions provided on heart failure disease management including the following:  Signs and symptoms of Heart Failure When to call the physician Importance of daily weights Low sodium diet Fluid restriction Medication management Anticipated future follow-up appointments  Patient education given on each of the above topics.  Patient acknowledges understanding and acceptance of all instructions.  I spoke at length with Jimmy Dawson, his daughter and granddaughter.  He tells me that he lives in a home in [place] with his wife.  He says that the home is full of "300 unopened boxes" and that his wife has a "problem".  He describes difficulty moving around in his home and says that he is forced to sleep in recliner due to the fact that the bed is covered with new suitcases.  His daughter and granddaughter verify what he says and also say that they have attempted to clean and are not allowed in the house by the wife.  I will discuss situation with CM and SW to explore options-however I do not believe that much can be done unfortunately.  He does have a scale and I have encouraged him to weigh each day and when to contact the physician.  I reviewed briefly a low sodium diet and high sodium foods to avoid.  He does admit that they frequently eat out and often high sodium foods.  He denies any issues getting or taking prescribed medications.  He follows with Jimmy Dawson.  Education Materials:  "Living Better With Heart Failure" Booklet, Daily Weight Tracker Tool   High Risk Criteria for Readmission and/or Poor Patient Outcomes:   EF <30%- Yes 15-20%  2 or more admissions in 6 months- no  Difficult social situation- yes- wife seems ? hoarder  Demonstrates medication noncompliance- denies    Barriers of Care:  Living situation, Knowledge and compliance  Discharge Planning:   Plans to return to home with wife.  Would  greatly benefit from HHRN-he does say that wife has refused secondary to home conditions in the past.  He says that he may be able to be seen by RN in his camper outside the home.

## 2016-09-20 NOTE — Evaluation (Addendum)
Physical Therapy Evaluation Patient Details Name: Jimmy Dawson MRN: [id number] DOB: 03-02-1929 Today's Date: 09/20/2016   History of Present Illness  Jimmy Dawson is a 83 year old patient of Dr Wynonia Lawman. Per Dr Thurman Coyer note--He had a mechanical fall in May and saw his primary doctor last week. He has had increasing confusion as well as memory difficulties since the fall and has had worsening dyspnea. He was noted to have worsening edema despite taking his medicines. His primary physician stopped his spironolactone and changed him to torsemide 100 mg once in the morning. Lab work showed a BNP of greater than 4200. He presented day of admit to hospital with a 31 pound weight gain since his previous visit just one month ago. He is having worsening shortness of breath but no chest pain and is also having some mild orthopnea and PND as well as generalized abdominal swelling.  Dx: CHF   Clinical Impression  Pt admitted with above diagnosis. Pt currently with functional limitations due to the deficits listed below (see PT Problem List). Pt was ambulating with cane PTA.  Needs RW currently and needs cues for safety with this device. Wife going in for cath in 2 days.  Feel that pt will need SNF to get stronger prior to d/c home. Will follow acutely.  Pt will benefit from skilled PT to increase their independence and safety with mobility to allow discharge to the venue listed below.      Follow Up Recommendations SNF;Supervision/Assistance - 24 hour    Equipment Recommendations  Other (comment) (TBA)    Recommendations for Other Services       Precautions / Restrictions Precautions Precautions: Fall Restrictions Weight Bearing Restrictions: No      Mobility  Bed Mobility Overal bed mobility: Independent                Transfers Overall transfer level: Needs assistance Equipment used: Rolling walker (2 wheeled) Transfers: Sit to/from Stand Sit to Stand: Supervision          General transfer comment: needed cues for hand placement  Ambulation/Gait Ambulation/Gait assistance: Min guard Ambulation Distance (Feet): 150 Feet Assistive device: Rolling walker (2 wheeled) Gait Pattern/deviations: Step-through pattern;Decreased stride length;Trunk flexed;Wide base of support;Leaning posteriorly;Drifts right/left   Gait velocity interpretation: Below normal speed for age/gender General Gait Details: Needed cues to stay close to rW espeically with turns as he tends to get outside of RW.  Pt unsafe at times with RW and did not use a device PTA.    Stairs            Wheelchair Mobility    Modified Rankin (Stroke Patients Only)       Balance Overall balance assessment: Needs assistance Sitting-balance support: No upper extremity supported;Feet supported Sitting balance-Leahy Scale: Good     Standing balance support: Bilateral upper extremity supported;During functional activity Standing balance-Leahy Scale: Poor Standing balance comment: relies on RW for support                             Pertinent Vitals/Pain Pain Assessment: No/denies pain    Home Living Family/patient expects to be discharged to:: Private residence Living Arrangements: Spouse/significant other Available Help at Discharge: Family;Available 24 hours/day Type of Home: House Home Access: Stairs to enter Entrance Stairs-Rails: Psychiatric nurse of Steps: 5 Home Layout: Two level;Full bath on main level Home Equipment: Walker - 2 wheels;Cane - single point;Walker - 4 wheels;Bedside  commode;Shower seat;Wheelchair - manual Additional Comments: Pt states he does not go up flight of stairs     Prior Function Level of Independence: Independent with assistive device(s)         Comments: Pt reports he stands to bathe and dress, assists with cooking and cleaning, walks with a cane at times, drives even though he isn't supposed to     Hand Dominance    Dominant Hand: Right    Extremity/Trunk Assessment   Upper Extremity Assessment Upper Extremity Assessment: Defer to OT evaluation    Lower Extremity Assessment Lower Extremity Assessment: Generalized weakness    Cervical / Trunk Assessment Cervical / Trunk Assessment: Kyphotic  Communication   Communication: HOH  Cognition Arousal/Alertness: Awake/alert Behavior During Therapy: Flat affect Overall Cognitive Status: History of cognitive impairments - at baseline                                 General Comments: per chart memory issues      General Comments      Exercises     Assessment/Plan    PT Assessment Patient needs continued PT services  PT Problem List Decreased strength;Decreased activity tolerance;Decreased balance;Decreased mobility;Decreased knowledge of use of DME;Decreased safety awareness;Decreased knowledge of precautions       PT Treatment Interventions DME instruction;Gait training;Stair training;Functional mobility training;Therapeutic activities;Therapeutic exercise;Balance training;Patient/family education    PT Goals (Current goals can be found in the Care Plan section)  Acute Rehab PT Goals Patient Stated Goal: to get better PT Goal Formulation: With patient Time For Goal Achievement: 10/04/16 Potential to Achieve Goals: Good    Frequency Min 3X/week   Barriers to discharge Decreased caregiver support Questionable social situation    Co-evaluation               AM-PAC PT "6 Clicks" Daily Activity  Outcome Measure Difficulty turning over in bed (including adjusting bedclothes, sheets and blankets)?: None Difficulty moving from lying on back to sitting on the side of the bed? : None Difficulty sitting down on and standing up from a chair with arms (e.g., wheelchair, bedside commode, etc,.)?: A Little Help needed moving to and from a bed to chair (including a wheelchair)?: A Little Help needed walking in hospital  room?: A Little Help needed climbing 3-5 steps with a railing? : A Lot 6 Click Score: 19    End of Session Equipment Utilized During Treatment: Gait belt Activity Tolerance: Patient limited by fatigue Patient left: in chair;with call bell/phone within reach;with chair alarm set Nurse Communication: Mobility status PT Visit Diagnosis: Unsteadiness on feet (R26.81);Muscle weakness (generalized) (M62.81)    Time: 7096-4383 PT Time Calculation (min) (ACUTE ONLY): 16 min   Charges:   PT Evaluation $PT Eval Moderate Complexity: 1 Procedure     PT G Codes:        Eden Nicolena Schurman,PT Acute Rehabilitation [phone] (pager)   Denice Paradise 09/20/2016, 3:14 PM

## 2016-09-20 NOTE — Progress Notes (Signed)
Subjective:  His weight is down and breathing is better.  He has still crackles in the bases.  His daughters were in the room today and there is a difficult social situation at home.  The patient does have some dementia that complicates his management.  Objective:  Vital Signs in the last 24 hours: BP 100/61 (BP Location: Right Arm)   Pulse 97   Temp 98.2 F (36.8 C) (Oral)   Resp 18   Ht 5\' 6"  (1.676 m)   Wt 79.6 kg (175 lb 8 oz) Comment: c scale  SpO2 100%   BMI 28.33 kg/m   Physical Exam: Elderly male in no acute distress, sitting up in the chair at the side of the bed  Lungs:  Reduced breath sounds at the bases with mild rales Cardiac:  Regular rhythm, normal S1 and S2, no S3 Abdomen:  Soft, nontender, no masses Extremities:  2+ edema present  Intake/Output from previous day: 06/20 0701 - 06/21 0700 In: 1200 [P.O.:1200] Out: 4850 [Urine:4850] Weight Filed Weights   09/18/16 1554 09/19/16 0427 09/20/16 0619  Weight: 89.5 kg (197 lb 6 oz) 84.6 kg (186 lb 6.4 oz) 79.6 kg (175 lb 8 oz)    Lab Results: Basic Metabolic Panel:  Recent Labs  09/19/16 0254 09/20/16 0340  NA 141 139  K 3.7 3.3*  CL 100* 98*  CO2 31 32  GLUCOSE 126* 109*  BUN 36* 28*  CREATININE 1.94* 1.66*    CBC:  Recent Labs  09/18/16 1810  WBC 7.2  NEUTROABS 5.2  HGB 11.3*  HCT 37.3*  MCV 92.1  PLT 151    BNP    Component Value Date/Time   BNP >4,500.0 (H) 09/18/2016 1810   BNP >4200.0 (H) 09/12/2016 1113   Telemetry: Normal sinus rhythm,Some runs of nonsustained ventricular tachycardia noted.  personally reviewed by me  Assessment/Plan:  1.  Acute on chronic systolic heart failure 2.  Mild-to-moderate aortic stenosis 3.  CAD without angina 4.  Chronic kidney disease stage III-IV Improving with diuresis  5.  Dementia with some compliance issues at home 6.  Nonsustained ventricular tachycardia due to heart failure  Recommendations:  Continue intravenous diuresis but reduce  rate with impressive diuresis.  Replete potassium.  Spironolactone was restarted yesterday.      Kerry Hough  MD [facility] Cardiology  09/20/2016, 9:33 AM

## 2016-09-21 LAB — GLUCOSE, CAPILLARY
GLUCOSE-CAPILLARY: 168 mg/dL — AB (ref 65–99)
GLUCOSE-CAPILLARY: 117 mg/dL — AB (ref 65–99)
GLUCOSE-CAPILLARY: 160 mg/dL — AB (ref 65–99)
Glucose-Capillary: 115 mg/dL — ABNORMAL HIGH (ref 65–99)

## 2016-09-21 LAB — BASIC METABOLIC PANEL
Anion gap: 8 (ref 5–15)
BUN: 25 mg/dL — ABNORMAL HIGH (ref 6–20)
CHLORIDE: 95 mmol/L — AB (ref 101–111)
CO2: 34 mmol/L — ABNORMAL HIGH (ref 22–32)
Calcium: 9 mg/dL (ref 8.9–10.3)
Creatinine, Ser: 1.82 mg/dL — ABNORMAL HIGH (ref 0.61–1.24)
GFR calc non Af Amer: 32 mL/min — ABNORMAL LOW (ref >60)
GFR, EST AFRICAN AMERICAN: 37 mL/min — AB (ref >60)
Glucose, Bld: 274 mg/dL — ABNORMAL HIGH (ref 65–99)
POTASSIUM: 3.6 mmol/L (ref 3.5–5.1)
Sodium: 137 mmol/L (ref 135–145)

## 2016-09-21 MED ORDER — FUROSEMIDE 10 MG/ML IJ SOLN
80.0000 mg | Freq: Two times a day (BID) | INTRAMUSCULAR | Status: DC
  Administered 2016-09-21 – 2016-09-22 (×2): 80 mg via INTRAVENOUS
  Filled 2016-09-21 (×2): qty 8

## 2016-09-21 NOTE — Clinical Social Work Placement (Signed)
   CLINICAL SOCIAL WORK PLACEMENT  NOTE  Date:  09/21/2016  Patient Details  Name: Jimmy Dawson MRN: [id number] Date of Birth: 1929/02/08  Clinical Social Work is seeking post-discharge placement for this patient at the [place] level of care (*CSW will initial, date and re-position this form in  chart as items are completed):  Yes   Patient/family provided with [place] Work Department's list of facilities offering this level of care within the geographic area requested by the patient (or if unable, by the patient's family).  Yes   Patient/family informed of their freedom to choose among providers that offer the needed level of care, that participate in Medicare, Medicaid or managed care program needed by the patient, have an available bed and are willing to accept the patient.  Yes   Patient/family informed of [facility]'s ownership interest in [facility] and [facility], as well as of the fact that they are under no obligation to receive care at these facilities.  PASRR submitted to EDS on 09/21/16     PASRR number received on       Existing PASRR number confirmed on 09/21/16     FL2 transmitted to all facilities in geographic area requested by pt/family on 09/21/16     FL2 transmitted to all facilities within larger geographic area on       Patient informed that his/her managed care company has contracts with or will negotiate with certain facilities, including the following:            Patient/family informed of bed offers received.  Patient chooses bed at       Physician recommends and patient chooses bed at      Patient to be transferred to   on  .  Patient to be transferred to facility by       Patient family notified on   of transfer.  Name of family member notified:        PHYSICIAN Please sign FL2     Additional Comment:    _______________________________________________ Candie Chroman, LCSW 09/21/2016, 2:15  PM

## 2016-09-21 NOTE — NC FL2 (Signed)
[place] MEDICAID FL2 LEVEL OF CARE SCREENING TOOL     IDENTIFICATION  Patient Name: Jimmy Dawson Birthdate: 15-Aug-1928 Sex: male Admission Date (Current Location): 09/18/2016  [facility] and [place] Number:  [employer] and Address:  [facility], [place] [address] [place], [place] [address]      Provider Number: [id number]  Attending Physician Name and Address:  Jacolyn Reedy, MD  Relative Name and Phone Number:       Current Level of Care: Hospital Recommended Level of Care: [place] Prior Approval Number:    Date Approved/Denied:   PASRR Number: [id number]  Discharge Plan: SNF    Current Diagnoses: Patient Active Problem List   Diagnosis Date Noted  . History of fall 09/12/2016  . Weight gain 09/12/2016  . Low back pain 04/11/2016  . Myalgia 04/11/2016  . Anemia 01/04/2016  . Acute on chronic systolic congestive heart failure ([place]) 11/16/2015  . Urine incontinence 10/19/2015  . Hearing loss 07/20/2015  . Dyspnea 04/20/2015  . Essential hypertension   . BCC (basal cell carcinoma), face 10/06/2014  . Actinic keratosis 10/06/2014  . Chronic systolic heart failure ([place]) 04/13/2014  . Aortic valve disease   . CAD (coronary artery disease)   . Hypertensive heart disease   . Cerebrovascular disease 10/22/2012  . DM (diabetes mellitus), type 2 with renal complications ([place]) 50/35/4656  . Stage III chronic kidney disease   . Hyperlipidemia 06/17/2006  . BPH (benign prostatic hypertrophy) 11/13/2004  . PSA elevation 11/13/2004  . Reflux esophagitis 02/29/2004  . Mild memory disturbance 11/02/2002  . Unspecified sleep apnea 05/06/2002    Orientation RESPIRATION BLADDER Height & Weight     Self, Time, Situation, Place  Normal Incontinent, External catheter Weight: 172 lb 4.8 oz (78.2 kg) (c scale) Height:  5\' 6"  (167.6 cm)  BEHAVIORAL SYMPTOMS/MOOD NEUROLOGICAL BOWEL NUTRITION STATUS   (None)  (None)  Continent Diet (2 gram sodium carb modified. Fluid restriction: 1500 mL.)  AMBULATORY STATUS COMMUNICATION OF NEEDS Skin   Limited Assist Verbally PU Stage and Appropriate Care PU Stage 1 Dressing:  (Foam prn)                     Personal Care Assistance Level of Assistance  Bathing, Feeding, Dressing Bathing Assistance: Limited assistance Feeding assistance: Independent Dressing Assistance: Limited assistance     Functional Limitations Info  Sight, Hearing, Speech Sight Info: Adequate Hearing Info: Impaired Speech Info: Adequate    SPECIAL CARE FACTORS FREQUENCY  PT (By licensed PT), Blood pressure     PT Frequency: 5 x week              Contractures Contractures Info: Not present    Additional Factors Info  Code Status, Allergies Code Status Info: Full Allergies Info: NKDA           Current Medications (09/21/2016):  This is the current hospital active medication list Current Facility-Administered Medications  Medication Dose Route Frequency Provider Last Rate Last Dose  . 0.9 %  sodium chloride infusion  250 mL Intravenous PRN Jacolyn Reedy, MD      . aspirin chewable tablet 81 mg  81 mg Oral QHS Jacolyn Reedy, MD   81 mg at 09/20/16 2207  . carvedilol (COREG) tablet 3.125 mg  3.125 mg Oral BID WC Jacolyn Reedy, MD   3.125 mg at 09/21/16 0845  . docusate sodium (COLACE) capsule 50 mg  50 mg  Oral Daily Jacolyn Reedy, MD   50 mg at 09/21/16 1011  . enoxaparin (LOVENOX) injection 30 mg  30 mg Subcutaneous Q24H Jacolyn Reedy, MD   30 mg at 09/20/16 1800  . furosemide (LASIX) injection 80 mg  80 mg Intravenous Q12H Jacolyn Reedy, MD      . glimepiride (AMARYL) tablet 2 mg  2 mg Oral Q breakfast Jacolyn Reedy, MD   2 mg at 09/21/16 0845  . insulin aspart (novoLOG) injection 0-9 Units  0-9 Units Subcutaneous TID WC Jacolyn Reedy, MD   2 Units at 09/21/16 1207  . ondansetron (ZOFRAN) injection 4 mg  4 mg Intravenous Q6H PRN Jacolyn Reedy, MD      . pantoprazole (PROTONIX) EC tablet 40 mg  40 mg Oral Daily Jacolyn Reedy, MD   40 mg at 09/21/16 1011  . sacubitril-valsartan (ENTRESTO) 24-26 mg per tablet  1 tablet Oral BID Jacolyn Reedy, MD   1 tablet at 09/21/16 1011  . sodium chloride flush (NS) 0.9 % injection 3 mL  3 mL Intravenous Q12H Jacolyn Reedy, MD   3 mL at 09/21/16 1011  . sodium chloride flush (NS) 0.9 % injection 3 mL  3 mL Intravenous PRN Jacolyn Reedy, MD      . spironolactone (ALDACTONE) tablet 25 mg  25 mg Oral Daily Jacolyn Reedy, MD   25 mg at 09/21/16 1011     Discharge Medications: Please see discharge summary for a list of discharge medications.  Relevant Imaging Results:  Relevant Lab Results:   Additional Information SS#: [id number]  Candie Chroman, LCSW

## 2016-09-21 NOTE — Evaluation (Addendum)
Occupational Therapy Evaluation and Discharge Patient Details Name: Jimmy Dawson MRN: [id number] DOB: 25-Jun-1928 Today's Date: 09/21/2016    History of Present Illness Jimmy Dawson is a 83 year old patient of Dr Wynonia Lawman. Per Dr Thurman Coyer note--He had a mechanical fall in May and saw his primary doctor last week. He has had increasing confusion as well as memory difficulties since the fall and has had worsening dyspnea. He was noted to have worsening edema despite taking his medicines. His primary physician stopped his spironolactone and changed him to torsemide 100 mg once in the morning. Lab work showed a BNP of greater than 4200. He presented day of admit to hospital with a 31 pound weight gain since his previous visit just one month ago. He is having worsening shortness of breath but no chest pain and is also having some mild orthopnea and PND as well as generalized abdominal swelling.  Dx: CHF    Clinical Impression   This 83 yo male admitted with above presents to acute OT at a setup/S to min A which is more A than he needed pta. He will benefit from follow up OT at SNF to get back to PLOF and return home with wife. Will defer remainder of therapy to that venue, we will sign off.     Follow Up Recommendations  SNF;Supervision/Assistance - 24 hour    Equipment Recommendations  None recommended by OT       Precautions / Restrictions Precautions Precautions: Fall Restrictions Weight Bearing Restrictions: No      Mobility Bed Mobility Overal bed mobility: Modified Independent             General bed mobility comments: HOB up and use of rail; pt normally sleeps in a recliner  Transfers Overall transfer level: Needs assistance Equipment used: Rolling walker (2 wheeled) Transfers: Sit to/from Stand Sit to Stand: Min guard              Balance Overall balance assessment: Needs assistance Sitting-balance support: No upper extremity supported;Feet supported Sitting  balance-Leahy Scale: Good     Standing balance support: Bilateral upper extremity supported;During functional activity Standing balance-Leahy Scale: Fair Standing balance comment: relies on RW for support dynamically, but not statically (ie: standing at sink to wash hands)                           ADL either performed or assessed with clinical judgement   ADL Overall ADL's : Needs assistance/impaired Eating/Feeding: Independent;Sitting   Grooming: Min guard;Wash/dry hands;Standing   Upper Body Bathing: Supervision/ safety;Set up;Sitting   Lower Body Bathing: Min guard;Sit to/from stand   Upper Body Dressing : Supervision/safety;Set up;Sitting   Lower Body Dressing: Min guard;Sit to/from stand   Toilet Transfer: Minimal assistance;Ambulation;RW;Grab bars;Comfort height toilet   Toileting- Clothing Manipulation and Hygiene: Min guard;Sit to/from stand               Vision Baseline Vision/History: Wears glasses Wears Glasses: At all times              Pertinent Vitals/Pain Pain Assessment: No/denies pain     Hand Dominance Right   Extremity/Trunk Assessment Upper Extremity Assessment Upper Extremity Assessment: Overall WFL for tasks assessed           Communication Communication Communication: HOH   Cognition Arousal/Alertness: Awake/alert Behavior During Therapy: WFL for tasks assessed/performed Overall Cognitive Status: History of cognitive impairments - at baseline  General Comments: per chart memory issues              Home Living Family/patient expects to be discharged to:: Private residence Living Arrangements: Spouse/significant other Available Help at Discharge: Family;Available 24 hours/day Type of Home: House Home Access: Stairs to enter [employer] of Steps: 5 Entrance Stairs-Rails: Right;Left Home Layout: Two level;Full bath on main level Alternate Level  Stairs-Number of Steps: flight Alternate Level Stairs-Rails: Right Bathroom Shower/Tub: Tub/shower unit;Curtain   Bathroom Toilet: Handicapped height     Home Equipment: Environmental consultant - 2 wheels;Cane - single point;Walker - 4 wheels;Bedside commode;Shower seat;Wheelchair - manual   Additional Comments: Pt states he does not go up flight of stairs       Prior Functioning/Environment Level of Independence: Independent with assistive device(s)        Comments: Pt reports he stands to bathe and dress, assists with cooking and cleaning, walks with a cane at times, drives even though he isn't supposed to        OT Problem List: Decreased strength;Impaired balance (sitting and/or standing)         OT Goals(Current goals can be found in the care plan section) Acute Rehab OT Goals Patient Stated Goal: to get better and go home  OT Frequency:                AM-PAC PT "6 Clicks" Daily Activity     Outcome Measure Help from another person eating meals?: None Help from another person taking care of personal grooming?: A Little Help from another person toileting, which includes using toliet, bedpan, or urinal?: A Little Help from another person bathing (including washing, rinsing, drying)?: A Little Help from another person to put on and taking off regular upper body clothing?: A Little Help from another person to put on and taking off regular lower body clothing?: A Little 6 Click Score: 19   End of Session Equipment Utilized During Treatment: Gait belt;Rolling walker Nurse Communication:  (called the floor (spoke to unit secretary)to let them know I had forgotten to turn the chair alarm on)  Activity Tolerance: Patient tolerated treatment well Patient left: in chair;with call bell/phone within reach;with chair alarm set  OT Visit Diagnosis: Unsteadiness on feet (R26.81)                Time: 1545-1600 OT Time Calculation (min): 15 min Charges:  OT General Charges $OT Visit: 1  Procedure OT Evaluation $OT Eval Moderate Complexity: 1 Procedure [place], OTR/L [phone] 09/21/2016

## 2016-09-21 NOTE — Progress Notes (Signed)
Subjective:  His breathing is better today.  Physical therapy is recommending skilled nursing at the present time.  His wife called me yesterday and told me that she is having a heart cath today.  She also mentions that she does not feel it is safe for him to drive and is trying to rid of his car.  He told me a story today that the reason that home health care has not been able to see him at home is because his wife will not let them,.  We previously have been told that it is he that has been refusing.  He describes a situation where there are extreme numbers of unopened boxes in his house and also his adult children are not allowed to visit him at home by his current wife and she makes arrangements to meet them elsewhere.  I asked him if he thought he felt safe at home and he stated that he did but there are a number of concerning signs there to me.  Objective:  Vital Signs in the last 24 hours: BP (!) 107/57 (BP Location: Right Arm)   Pulse 85   Temp 97.3 F (36.3 C) (Oral)   Resp 17   Ht 5\' 6"  (1.676 m)   Wt 78.2 kg (172 lb 4.8 oz) Comment: c scale  SpO2 100%   BMI 27.81 kg/m   Physical Exam: Elderly male in no acute distress, sitting up in the chair at the side of the bed  Lungs:  Reduced breath sounds at the bases with mild rales Cardiac:  Regular rhythm, normal S1 and S2, no S3 Abdomen:  Soft, nontender, no masses Extremities:  1+ edema present  Intake/Output from previous day: 06/21 0701 - 06/22 0700 In: 1440 [P.O.:1440] Out: 4575 [Urine:4575] Weight Filed Weights   09/19/16 0427 09/20/16 0619 09/21/16 0526  Weight: 84.6 kg (186 lb 6.4 oz) 79.6 kg (175 lb 8 oz) 78.2 kg (172 lb 4.8 oz)    Lab Results: Basic Metabolic Panel:  Recent Labs  09/19/16 0254 09/20/16 0340  NA 141 139  K 3.7 3.3*  CL 100* 98*  CO2 31 32  GLUCOSE 126* 109*  BUN 36* 28*  CREATININE 1.94* 1.66*    CBC:  Recent Labs  09/18/16 1810  WBC 7.2  NEUTROABS 5.2  HGB 11.3*  HCT 37.3*  MCV  92.1  PLT 151    BNP    Component Value Date/Time   BNP >4,500.0 (H) 09/18/2016 1810   BNP >4200.0 (H) 09/12/2016 1113   Telemetry: Normal sinus rhythm,Some runs of nonsustained ventricular tachycardia noted.  personally reviewed by me  Assessment/Plan:  1.  Acute on chronic systolic heart failure-Improved with diuresis and close to dry weight 2.  Mild-to-moderate aortic stenosis 3.  CAD without angina 4.  Chronic kidney disease stage III-IV Improving with diuresis  5.  Dementia with some compliance issues at home 6.  Nonsustained ventricular tachycardia due to heart failure  Recommendations:  Reduce intravenous diuresis today and switch to by mouth tomorrow.  I think that he is going to need to have skilled nursing.  I talked to the social worker today about whether his home situation needs to be assessed.  We've previously been multiple attempts to get home health involved and have been told that he will send them away but his story is that it is not him but his wife.  I think this will need to be looked at and evaluated.  Recheck metabolic panel today.  Probably will need to be capped over the weekend in order to sort out all of the above issues.  Recommended skilled nursing.   Kerry Hough  MD [facility] Cardiology  09/21/2016, 8:42 AM

## 2016-09-21 NOTE — Progress Notes (Signed)
Dr Wynonia Lawman services paged to inform him of patient current BP of 94/70. Patient currently on laxis 80mg  every 8 hours routine. Awaiting call back.

## 2016-09-21 NOTE — Progress Notes (Signed)
Inpatient Diabetes Program Recommendations  AACE/ADA: New Consensus Statement on Inpatient Glycemic Control (2015)  Target Ranges:  Prepandial:   less than 140 mg/dL      Peak postprandial:   less than 180 mg/dL (1-2 hours)      Critically ill patients:  140 - 180 mg/dL  Results for TRACE, WIRICK (MRN [id number]) as of 09/21/2016 09:16  Ref. Range 09/20/2016 07:37 09/20/2016 12:12 09/20/2016 16:35 09/20/2016 17:23 09/20/2016 20:54 09/21/2016 07:38  Glucose-Capillary Latest Ref Range: 65 - 99 mg/dL 102 (H) 237 (H) 67 81 181 (H) 115 (H)    Review of Glycemic Control  Diabetes history: DM2 Outpatient Diabetes medications: Amaryl 2 mg QAM, Metformin 1000 mg BID Current orders for Inpatient glycemic control: Amaryl 2 mg QAM, Novolog 0-9 units TID with meals  Inpatient Diabetes Program Recommendations: Correction (SSI): Please consider ordering Novolog 0-5 units QHS for bedtime correction. Oral Agents: While inpatient, please consider discontinuing Amaryl.  Thanks, Barnie Alderman, RN, MSN, CDE Diabetes Coordinator Inpatient Diabetes Program [phone] (Team Pager from 8am to 5pm)

## 2016-09-21 NOTE — Progress Notes (Signed)
Call back received from Dr Raiford Simmonds. Dr Raiford Simmonds informed that patient is been  treated for CHF and  BP 94/70. Patient SBP has been running in the low 90's since 12:16pm this afternoon. Patient is currently on IV laxis 80mg  every 8 hours. Patient last echo 15-20%. Patient has received a total dose 80mg  of laxis 8 times since admission .Patient asymptomatic at this time. Will continue to monitor patient. Dr Raiford Simmonds verbalizes " Its ok to give 80mg  of IV laxis due "

## 2016-09-21 NOTE — Care Management Important Message (Signed)
Important Message  Patient Details  Name: Jimmy Dawson MRN: [id number] Date of Birth: 04/28/1928   Medicare Important Message Given:  Yes    Dejana Pugsley Montine Circle 09/21/2016, 12:18 PM

## 2016-09-21 NOTE — Clinical Social Work Note (Signed)
APS report completed with Caron Presume at [facility]. She will call CSW with determination. Per RN, patient is now agreeable to SNF. CSW met with patient to confirm. CSW will fax out referral today.  Dayton Scrape, [place]

## 2016-09-21 NOTE — Clinical Social Work Note (Addendum)
Clinical Social Work Assessment  Patient Details  Name: Jimmy Dawson MRN: [id number] Date of Birth: 11-Apr-1928  Date of referral:  09/21/16               Reason for consult:  Facility Placement, Discharge Planning, Housing Concerns/Homelessness                Permission sought to share information with:    Permission granted to share information::  No  Name::        Agency::     Relationship::     Contact Information:     Housing/Transportation Living arrangements for the past 2 months:  Single Family Home Source of Information:  Patient, Medical Team Patient Interpreter Needed:  None Criminal Activity/Legal Involvement Pertinent to Current Situation/Hospitalization:  No - Comment as needed Significant Relationships:  Adult Children, Spouse Lives with:  Spouse Do you feel safe going back to the place where you live?  Yes Need for family participation in patient care:  Yes (Comment)  Care giving concerns:  PT recommending SNF once medically stable for discharge. Concern regarding hoarding at the patient's home.   Social Worker assessment / plan:  CSW met with patient. No supports at bedside. CSW introduced role and explained that PT recommendations would be discussed. Patient immediately declined SNF. He also declined HHPT, stating that his wife will not let them come inside because she is embarrassed about the way the house looks. CSW explained that staff are concerned about his health and his need for more care than he is getting right now. Patient expressed understanding but is still not agreeable. The patient reports that there are around 300 unopened boxes from stores that his wife has ordered from. The patient stated one of the staff were told they were his boxes but the patient is adamant that all of it was ordered by his wife. The patient states that he is able to get around the house safely. He sleeps in a recliner every night. CSW asked the patient if he was eating well at home  and the patient replied "not really." CSW inquired as to why and the patient stated that he was getting tired of eating the same thing over and over again. The patient stated that he goes to the grocery store regularly and there is plenty of food in the house. CSW asked about medication management. The patient states that he manages his medications on his own and goes upstairs to take them where there is less clutter because his wife is unable to get up the stairs. The patient states he gets up the stairs safely each time. The patient reports that he does not use a cane or a walker at home and instead hangs on to objects close to him when needed. The patient reports regularly mowing his lawn and driving. CSW assessed for his house being a fire hazard and the patient stated that the boxes do not block the doors if he needed to get out for a fire. The patient has a son and daughter that live in [place]. He moved to [place] when he married his wife. The patient states that his wife and daughter do not get along. The patient's wife has a daughter that is very supportive of him. 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 Patient provided information on how he and his wife  met and their life together now. The patient states that he has no concerns about returning home and feels safe being there. CSW updated RNCM to make her aware that he declined SNF. This case does not appear to warrant an APS report. CSW left a message for MD at his office. No further concerns. CSW will wait to see if MD has any other concerns before signing off.  12:30 pm: CSW discussed with MD. CSW also discussed case with CSW that was previously an Herbalist. She stated that this case would likely be screened out because patient is not home bound and has capacity to make his own decisions. She also stated that APS may be able to  link patient and his wife to resources so CSW left voicemail with APS. Awaiting call back.  Employment status:  Retired Forensic scientist:  Medicare PT Recommendations:  [place] / Referral to community resources:  Dawson  Patient/Family's Response to care:  Patient declining SNF and prefers to return home. Patient's children are supportive. Patient appreciated social work intervention.  Patient/Family's Understanding of and Emotional Response to Diagnosis, Current Treatment, and Prognosis:  Patient has a good understanding of the reason for admission. Patient appears happy with hospital care.  Emotional Assessment Appearance:  Appears stated age Attitude/Demeanor/Rapport:  Other (Pleasant) Affect (typically observed):  Appropriate, Calm, Pleasant Orientation:  Oriented to Self, Oriented to Place, Oriented to  Time, Oriented to Situation Alcohol / Substance use:  Never Used Psych involvement (Current and /or in the community):  No (Comment)  Discharge Needs  Concerns to be addressed:  Care Coordination Readmission within the last 30 days:  No Current discharge risk:  Dependent with Mobility Barriers to Discharge:  Continued Medical Work up   Candie Chroman, LCSW 09/21/2016, 11:51 AM

## 2016-09-21 NOTE — Progress Notes (Signed)
   09/21/16 2100  Vitals  Temp 97.5 F (36.4 C)  Temp Source Oral  BP (!) 85/55  BP Location Right Arm  BP Method Automatic  Patient Position (if appropriate) Lying  Pulse Rate 79  Pulse Rate Source Dinamap  Resp 18  Oxygen Therapy  SpO2 98 %  O2 Device Room Air  Pt's BP low, pt asymptomatic. Dr. Eula Fried notified and ordered to hold entresto.

## 2016-09-22 DIAGNOSIS — I5023 Acute on chronic systolic (congestive) heart failure: Secondary | ICD-10-CM

## 2016-09-22 LAB — BASIC METABOLIC PANEL
ANION GAP: 9 (ref 5–15)
BUN: 30 mg/dL — ABNORMAL HIGH (ref 6–20)
CALCIUM: 8.7 mg/dL — AB (ref 8.9–10.3)
CO2: 30 mmol/L (ref 22–32)
CREATININE: 1.69 mg/dL — AB (ref 0.61–1.24)
Chloride: 97 mmol/L — ABNORMAL LOW (ref 101–111)
GFR calc Af Amer: 41 mL/min — ABNORMAL LOW (ref >60)
GFR, EST NON AFRICAN AMERICAN: 35 mL/min — AB (ref >60)
Glucose, Bld: 145 mg/dL — ABNORMAL HIGH (ref 65–99)
Potassium: 3.8 mmol/L (ref 3.5–5.1)
SODIUM: 136 mmol/L (ref 135–145)

## 2016-09-22 LAB — GLUCOSE, CAPILLARY
GLUCOSE-CAPILLARY: 104 mg/dL — AB (ref 65–99)
GLUCOSE-CAPILLARY: 129 mg/dL — AB (ref 65–99)
Glucose-Capillary: 76 mg/dL (ref 65–99)
Glucose-Capillary: 135 mg/dL — ABNORMAL HIGH (ref 65–99)

## 2016-09-22 MED ORDER — TORSEMIDE 20 MG PO TABS
100.0000 mg | ORAL_TABLET | Freq: Every day | ORAL | Status: DC
  Administered 2016-09-22 – 2016-09-23 (×2): 100 mg via ORAL
  Filled 2016-09-22 (×2): qty 5

## 2016-09-22 NOTE — Progress Notes (Signed)
Progress Note  Patient Name: Jimmy Dawson Date of Encounter: 09/22/2016  Primary Cardiologist: Wynonia Lawman  Subjective   No significant SOB, no CP  Inpatient Medications    Scheduled Meds: . aspirin  81 mg Oral QHS  . carvedilol  3.125 mg Oral BID WC  . docusate sodium  50 mg Oral Daily  . enoxaparin (LOVENOX) injection  30 mg Subcutaneous Q24H  . furosemide  80 mg Intravenous Q12H  . glimepiride  2 mg Oral Q breakfast  . insulin aspart  0-9 Units Subcutaneous TID WC  . pantoprazole  40 mg Oral Daily  . sacubitril-valsartan  1 tablet Oral BID  . sodium chloride flush  3 mL Intravenous Q12H  . spironolactone  25 mg Oral Daily   Continuous Infusions: . sodium chloride     PRN Meds: sodium chloride, ondansetron (ZOFRAN) IV, sodium chloride flush   Vital Signs    Vitals:   09/21/16 1532 09/21/16 2100 09/22/16 0518 09/22/16 0911  BP: 94/67 (!) 85/55 95/63 (!) 98/50  Pulse: 83 79 82 82  Resp:  18 17   Temp:  97.5 F (36.4 C) 97.5 F (36.4 C)   TempSrc:  Oral Oral   SpO2:  98% 97%   Weight:   171 lb 4.8 oz (77.7 kg)   Height:        Intake/Output Summary (Last 24 hours) at 09/22/16 1003 Last data filed at 09/22/16 0803  Gross per 24 hour  Intake              700 ml  Output             3800 ml  Net            -3100 ml   Filed Weights   09/20/16 0619 09/21/16 0526 09/22/16 0518  Weight: 175 lb 8 oz (79.6 kg) 172 lb 4.8 oz (78.2 kg) 171 lb 4.8 oz (77.7 kg)    Telemetry    NSR, rare NSVT - Personally Reviewed  ECG    NSR NSSTW changes, LAD - Personally Reviewed  Physical Exam   GEN: No acute distress.  Elderly Neck: No JVD Cardiac: RRR, no murmurs, rubs, or gallops.  Respiratory: Clear to auscultation bilaterally. Decreased bases GI: Soft, nontender, non-distended  MS: Mild edema; No deformity. Neuro:  Nonfocal  Psych: Normal affect   Labs    Chemistry Recent Labs Lab 09/18/16 1810  09/20/16 0340 09/21/16 1003 09/22/16 0405  NA 143  < > 139  137 136  K 3.7  < > 3.3* 3.6 3.8  CL 102  < > 98* 95* 97*  CO2 29  < > 32 34* 30  GLUCOSE 95  < > 109* 274* 145*  BUN 40*  < > 28* 25* 30*  CREATININE 2.00*  < > 1.66* 1.82* 1.69*  CALCIUM 9.3  < > 9.0 9.0 8.7*  PROT 6.9  --   --   --   --   ALBUMIN 4.0  --   --   --   --   AST 32  --   --   --   --   ALT 27  --   --   --   --   ALKPHOS 77  --   --   --   --   BILITOT 0.6  --   --   --   --   GFRNONAA 29*  < > 36* 32* 35*  GFRAA 33*  < >  41* 37* 41*  ANIONGAP 12  < > 9 8 9   < > = values in this interval not displayed.   Hematology Recent Labs Lab 09/18/16 1810  WBC 7.2  RBC 4.05*  HGB 11.3*  HCT 37.3*  MCV 92.1  MCH 27.9  MCHC 30.3  RDW 17.0*  PLT 151    Cardiac EnzymesNo results for input(s): TROPONINI in the last 168 hours. No results for input(s): TROPIPOC in the last 168 hours.   BNP Recent Labs Lab 09/18/16 1810  BNP >4,500.0*     DDimer No results for input(s): DDIMER in the last 168 hours.   Radiology    No results found.  Cardiac Studies   - Left ventricle: The cavity size was normal. Systolic function was   severely reduced. The estimated ejection fraction was in the   range of 15% to 20%. Diffuse hypokinesis. - Aortic valve: Trileaflet; moderately thickened, mildly calcified   leaflets. Valve mobility was mildly restricted. There was mild   stenosis. Valve area (VTI): 1.8 cm^2. Valve area (Vmax): 2.72   cm^2. Valve area (Vmean): 1.88 cm^2. - Mitral valve: There was mild regurgitation. Valve area by   continuity equation (using LVOT flow): 5.66 cm^2. - Left atrium: The atrium was severely dilated. - Right ventricle: The cavity size was dilated. Systolic function   was mildly reduced. - Right atrium: The atrium was mildly to moderately dilated. - Tricuspid valve: There was moderate regurgitation. - Pulmonary arteries: Systolic pressure was moderately increased.   PA peak pressure: 52 mm Hg (S).  Patient Profile     83 y.o. male with acute  on chronic systolic HF, EF 03%, mild to moderate aortic stenosis, CAD no angina, CKD 3, Dementia, NSVT  Assessment & Plan     - changing lasix 80 IV BID to PO Demedex 100 QD as on at home  - out total 13 L  - continue spironolactone (K 3.8)  - Continue entresto  Await SNF placement. Dr. Wynonia Lawman had long discussion Friday  Signed, Candee Furbish, MD  09/22/2016, 10:03 AM

## 2016-09-22 NOTE — Progress Notes (Addendum)
Pt stable during 7am to 7pm shift, no any complications and complain of chest pain noted, pt was out of bed most of the time sitting in the recliner, Condom catheter is in place, will continue to monitor

## 2016-09-22 NOTE — Progress Notes (Signed)
   09/22/16 0518  Vitals  Temp 97.5 F (36.4 C)  Temp Source Oral  BP 95/63  BP Location Left Arm  BP Method Automatic  Patient Position (if appropriate) Sitting  Pulse Rate 82  Pulse Rate Source Dinamap  Resp 17  Oxygen Therapy  SpO2 97 %  O2 Device Room Air  Height and Weight  Weight 77.7 kg (171 lb 4.8 oz) (Scale C)  Type of Scale Used Standing  Type of Weight Actual  Dr. Eula Fried informed of pt's BP, ok to give scheduled lasix this morning.

## 2016-09-23 LAB — BASIC METABOLIC PANEL
Anion gap: 7 (ref 5–15)
BUN: 34 mg/dL — ABNORMAL HIGH (ref 6–20)
CHLORIDE: 96 mmol/L — AB (ref 101–111)
CO2: 32 mmol/L (ref 22–32)
CREATININE: 1.91 mg/dL — AB (ref 0.61–1.24)
Calcium: 8.8 mg/dL — ABNORMAL LOW (ref 8.9–10.3)
GFR calc non Af Amer: 30 mL/min — ABNORMAL LOW (ref >60)
GFR, EST AFRICAN AMERICAN: 35 mL/min — AB (ref >60)
Glucose, Bld: 161 mg/dL — ABNORMAL HIGH (ref 65–99)
Potassium: 4.6 mmol/L (ref 3.5–5.1)
SODIUM: 135 mmol/L (ref 135–145)

## 2016-09-23 LAB — GLUCOSE, CAPILLARY
GLUCOSE-CAPILLARY: 91 mg/dL (ref 65–99)
Glucose-Capillary: 141 mg/dL — ABNORMAL HIGH (ref 65–99)
Glucose-Capillary: 105 mg/dL — ABNORMAL HIGH (ref 65–99)
Glucose-Capillary: 130 mg/dL — ABNORMAL HIGH (ref 65–99)

## 2016-09-23 NOTE — Progress Notes (Signed)
Pt stable during 7am to 7pm shift, no any complications and complain of chest pain noted, pt was out of bed most of the time sitting in the recliner, Condom catheter is in place, CBG stable, vitals stable,will continue to monitor

## 2016-09-23 NOTE — Progress Notes (Signed)
Progress Note  Patient Name: Jimmy Dawson Date of Encounter: 09/23/2016  Primary Cardiologist: Wynonia Lawman  Subjective   No CP, no SOB. Was sleeping but aroused easily. Pleasant.   Inpatient Medications    Scheduled Meds: . aspirin  81 mg Oral QHS  . carvedilol  3.125 mg Oral BID WC  . docusate sodium  50 mg Oral Daily  . enoxaparin (LOVENOX) injection  30 mg Subcutaneous Q24H  . glimepiride  2 mg Oral Q breakfast  . insulin aspart  0-9 Units Subcutaneous TID WC  . pantoprazole  40 mg Oral Daily  . sacubitril-valsartan  1 tablet Oral BID  . sodium chloride flush  3 mL Intravenous Q12H  . spironolactone  25 mg Oral Daily  . torsemide  100 mg Oral Daily   Continuous Infusions: . sodium chloride     PRN Meds: sodium chloride, ondansetron (ZOFRAN) IV, sodium chloride flush   Vital Signs    Vitals:   09/22/16 1622 09/22/16 1928 09/23/16 0657 09/23/16 0857  BP: 98/65 (!) 93/53 105/65 (!) 98/57  Pulse: 86 87 83 80  Resp: 18 18 18    Temp: 98.4 F (36.9 C) 98.2 F (36.8 C) 98.2 F (36.8 C)   TempSrc: Oral Oral Oral   SpO2: 98% 95% 95%   Weight:   168 lb 3.2 oz (76.3 kg)   Height:        Intake/Output Summary (Last 24 hours) at 09/23/16 1111 Last data filed at 09/23/16 0900  Gross per 24 hour  Intake              900 ml  Output             1700 ml  Net             -800 ml   Filed Weights   09/21/16 0526 09/22/16 0518 09/23/16 0657  Weight: 172 lb 4.8 oz (78.2 kg) 171 lb 4.8 oz (77.7 kg) 168 lb 3.2 oz (76.3 kg)    Telemetry    NSR, rare NSVT - Personally Reviewed  ECG    NSR NSSTW changes, LAD - Personally Reviewed  Physical Exam   GEN: No acute distress.  Elderly Neck: No JVD Cardiac: RRR, no murmurs, rubs, or gallops.  Respiratory: Clear to auscultation bilaterally. Decreased bases GI: Soft, nontender, non-distended  MS: Mild edema; No deformity. Neuro:  Nonfocal  Psych: Normal affect   No change in exam, completely performed as above  Labs    Chemistry Recent Labs Lab 09/18/16 1810  09/21/16 1003 09/22/16 0405 09/23/16 0412  NA 143  < > 137 136 135  K 3.7  < > 3.6 3.8 4.6  CL 102  < > 95* 97* 96*  CO2 29  < > 34* 30 32  GLUCOSE 95  < > 274* 145* 161*  BUN 40*  < > 25* 30* 34*  CREATININE 2.00*  < > 1.82* 1.69* 1.91*  CALCIUM 9.3  < > 9.0 8.7* 8.8*  PROT 6.9  --   --   --   --   ALBUMIN 4.0  --   --   --   --   AST 32  --   --   --   --   ALT 27  --   --   --   --   ALKPHOS 77  --   --   --   --   BILITOT 0.6  --   --   --   --  GFRNONAA 29*  < > 32* 35* 30*  GFRAA 33*  < > 37* 41* 35*  ANIONGAP 12  < > 8 9 7   < > = values in this interval not displayed.   Hematology  Recent Labs Lab 09/18/16 1810  WBC 7.2  RBC 4.05*  HGB 11.3*  HCT 37.3*  MCV 92.1  MCH 27.9  MCHC 30.3  RDW 17.0*  PLT 151    Cardiac EnzymesNo results for input(s): TROPONINI in the last 168 hours. No results for input(s): TROPIPOC in the last 168 hours.   BNP  Recent Labs Lab 09/18/16 1810  BNP >4,500.0*     DDimer No results for input(s): DDIMER in the last 168 hours.   Radiology    No results found.  Cardiac Studies   - Left ventricle: The cavity size was normal. Systolic function was   severely reduced. The estimated ejection fraction was in the   range of 15% to 20%. Diffuse hypokinesis. - Aortic valve: Trileaflet; moderately thickened, mildly calcified   leaflets. Valve mobility was mildly restricted. There was mild   stenosis. Valve area (VTI): 1.8 cm^2. Valve area (Vmax): 2.72   cm^2. Valve area (Vmean): 1.88 cm^2. - Mitral valve: There was mild regurgitation. Valve area by   continuity equation (using LVOT flow): 5.66 cm^2. - Left atrium: The atrium was severely dilated. - Right ventricle: The cavity size was dilated. Systolic function   was mildly reduced. - Right atrium: The atrium was mildly to moderately dilated. - Tricuspid valve: There was moderate regurgitation. - Pulmonary arteries: Systolic  pressure was moderately increased.   PA peak pressure: 52 mm Hg (S).  Patient Profile     83 y.o. male with acute on chronic systolic HF, EF 59%, mild to moderate aortic stenosis, CAD no angina, CKD 3, Dementia, NSVT  Assessment & Plan     - changing lasix 80 IV BID to PO Demedex 100 QD as on at home  - out total 14.6 L  - continue spironolactone (K 3.8)  - Continue entresto  - Creat mildly elevated today to 1.91 on 19th was 2.0  Await SNF placement. Dr. Wynonia Lawman had long discussion Friday  Signed, Candee Furbish, MD  09/23/2016, 11:11 AM

## 2016-09-24 LAB — BASIC METABOLIC PANEL
ANION GAP: 11 (ref 5–15)
BUN: 40 mg/dL — ABNORMAL HIGH (ref 6–20)
CHLORIDE: 97 mmol/L — AB (ref 101–111)
CO2: 27 mmol/L (ref 22–32)
Calcium: 8.6 mg/dL — ABNORMAL LOW (ref 8.9–10.3)
Creatinine, Ser: 1.72 mg/dL — ABNORMAL HIGH (ref 0.61–1.24)
GFR calc Af Amer: 40 mL/min — ABNORMAL LOW (ref >60)
GFR calc non Af Amer: 34 mL/min — ABNORMAL LOW (ref >60)
GLUCOSE: 172 mg/dL — AB (ref 65–99)
POTASSIUM: 4.1 mmol/L (ref 3.5–5.1)
Sodium: 135 mmol/L (ref 135–145)

## 2016-09-24 LAB — GLUCOSE, CAPILLARY
GLUCOSE-CAPILLARY: 112 mg/dL — AB (ref 65–99)
Glucose-Capillary: 80 mg/dL (ref 65–99)

## 2016-09-24 MED ORDER — TORSEMIDE 20 MG PO TABS: 60.0000 mg | ORAL_TABLET | Freq: Two times a day (BID) | ORAL | Status: AC

## 2016-09-24 MED ORDER — TORSEMIDE 20 MG PO TABS
60.0000 mg | ORAL_TABLET | Freq: Two times a day (BID) | ORAL | Status: DC
  Administered 2016-09-24: 60 mg via ORAL
  Filled 2016-09-24: qty 3

## 2016-09-24 MED ORDER — FERROUS SULFATE 325 (65 FE) MG PO TABS: 325.0000 mg | ORAL_TABLET | Freq: Two times a day (BID) | ORAL | 3 refills | Status: AC

## 2016-09-24 MED ORDER — SPIRONOLACTONE 25 MG PO TABS: 25.0000 mg | ORAL_TABLET | Freq: Every day | ORAL | Status: AC

## 2016-09-24 NOTE — Progress Notes (Signed)
Subjective:  He has been changed to oral diuretics now.  His previous home regimen and was torsemide 60 mg the morning and 40 in the afternoon although his primary physician changed him to a different dosage the week prior to admission.  I am going to change him back to his other dose.  His breathing is better and he has no edema.  He is willing to go to skilled nursing at the present time.  I spoke to him again today about letting the home health nurses into his house.  Objective:  Vital Signs in the last 24 hours: BP 105/78   Pulse 98   Temp 97.4 F (36.3 C) (Oral)   Resp 20   Ht 5\' 6"  (1.676 m)   Wt 77.8 kg (171 lb 9.6 oz)   SpO2 94%   BMI 27.70 kg/m   Physical Exam: Elderly male in no acute distress, sitting up in the chair at the side of the bed  Lungs:  Reduced breath sounds at the bases.   Cardiac:  Regular rhythm, normal S1 and S2, no S3 2/6 systolic murmur  Abdomen:  Soft, nontender, no masses Extremities:  No edema present  Intake/Output from previous day: 06/24 0701 - 06/25 0700 In: 900 [P.O.:900] Out: 3525 [Urine:3525] Weight Filed Weights   09/22/16 0518 09/23/16 0657 09/24/16 0500  Weight: 77.7 kg (171 lb 4.8 oz) 76.3 kg (168 lb 3.2 oz) 77.8 kg (171 lb 9.6 oz)    Lab Results: Basic Metabolic Panel:  Recent Labs  09/23/16 0412 09/24/16 0336  NA 135 135  K 4.6 4.1  CL 96* 97*  CO2 32 27  GLUCOSE 161* 172*  BUN 34* 40*  CREATININE 1.91* 1.72*    CBC: No results for input(s): WBC, NEUTROABS, HGB, HCT, MCV, PLT in the last 72 hours.  BNP    Component Value Date/Time   BNP >4,500.0 (H) 09/18/2016 1810   BNP >4200.0 (H) 09/12/2016 1113   Telemetry: Normal sinus rhythm,  Assessment/Plan:  1.  Acute on chronic systolic heart failure-Improved with diuresis and Now at dry weight. 2.  Mild-to-moderate aortic stenosis 3.  CAD without angina 4.  Chronic kidney disease stage III-IV Improving with diuresis  5.  Dementia with some compliance issues at  home 6.  Nonsustained ventricular tachycardia due to heart failure  Recommendations:  He is currently at his dry weight and is no longer edematous.  We are awaiting skilled nursing placement.  Renal function is improved today.  Kerry Hough  MD [facility] Card[facility]  09/24/2016, 9:13 AM

## 2016-09-24 NOTE — Clinical Social Work Note (Signed)
CSW facilitated patient discharge including contacting patient family and facility to confirm patient discharge plans. Also notified Caron Presume with [facility] DSS of plan for discharge today. Clinical information faxed to facility and family agreeable with plan. CSW arranged ambulance transport via [facility] to [facility]. RN to call report prior to discharge [phone] Room 501A).  CSW will sign off for now as social work intervention is no longer needed. Please consult [place] again if new needs arise.  Jimmy Dawson, [place]

## 2016-09-24 NOTE — Discharge Summary (Addendum)
Physician Discharge Summary  Patient ID: Jimmy Dawson MRN: [id number] DOB/AGE: [birth year] 81 y.o.  Admit date: 09/18/2016 Discharge date: 09/24/2016  Primary Physician:  [facility]  Primary Discharge Diagnosis:  1.  Acute on chronic systolic heart failure  Secondary Discharge Diagnosis: 2.  Coronary artery disease with ischemic cardiomyopathy 3.  Mild-to-moderate aortic stenosis 4.  Stage III-IV chronic kidney disease 5.  Early dementia 6.  Non-insulin-dependent diabetes mellitus 7.  Calcification of aorta  Procedures:  Echocardiogram  Hospital Course: The patient was admitted to the hospital for treatment of congestive heart failure.  He had a mechanical fall in May of this year saw his primary physician the week prior to admission.  At that time he was noted to have a weight gain as well as worsening dyspnea and had worsening edema despite taking his medicines.  His primary physician stopped his spironolactone and changed him to torsemide 100 mg in the morning.  Lab work at that time showed a BNP of greater than 4200.  He presented to my office the day of admission with a 31 pound weight gain since his previous visit one month ago.  He was having worsening shortness of breath but no chest pain and having orthopnea and PND as well as generalized abdominal swelling.  He was difficult to tell how compliant he had been with his medications.  Because of failure to improve in his breathing and failure to diurese as an outpatient he was brought in for inpatient treatment of congestive heart failure.  The patient was placed on intravenous furosemide on admission.  Repeat echocardiogram showed an ejection fraction of 15-20% and mild-to-moderate aortic stenosis.  He diuresed quite well.  He diuresed from 197 pounds to a nadir of 168 pounds and was 171 on discharge.  Extensive discussion was had with patient.  Previous efforts to try to get home health involved have been difficult in that  the history that we had received is that the patient was refusing to let them come into the house however on conversation with the patient he stated that his wife was not willing to let them enter the home.  I consulted social work to ask them to try to make arrangements for him to be seen on home health but discharge.  He was seen by physical therapy and occupational therapy and was felt to be in need of skilled nursing because of safety issues.  He initially was resistant to this but then was agreeable to go to skilled nursing and at the time of this dictation plans are being made for him to go to skilled nursing to gain strength and to be sure that his congestive heart failure is under good control.  He was diuresed and had no shortness of breath or edema at the time of discharge.  Because of severe heart failure his metformin will be stopped.  He will be continued on glimepiride.  His torsemide was changed back to 60 mg twice daily and spironolactone was added back to his regimen without difficulty.  Telemetry showed some nonsustained ventricular tachycardia that we will not pursue further treatment for at this time.  Discharge Exam: Blood pressure 105/78, pulse 98, temperature 97.4 F (36.3 C), temperature source Oral, resp. rate 20, height 5\' 6"  (1.676 m), weight 77.8 kg (171 lb 9.6 oz), SpO2 94 %. Weight: 77.8 kg (171 lb 9.6 oz) Lungs clear, no peripheral edema, no murmur Labs: CBC:   Lab Results  Component Value Date  WBC 7.2 09/18/2016   HGB 11.3 (L) 09/18/2016   HCT 37.3 (L) 09/18/2016   MCV 92.1 09/18/2016   PLT 151 09/18/2016   CMP:  Recent Labs Lab 09/18/16 1810  09/24/16 0336  NA 143  < > 135  K 3.7  < > 4.1  CL 102  < > 97*  CO2 29  < > 27  BUN 40*  < > 40*  CREATININE 2.00*  < > 1.72*  CALCIUM 9.3  < > 8.6*  PROT 6.9  --   --   BILITOT 0.6  --   --   ALKPHOS 77  --   --   ALT 27  --   --   AST 32  --   --   GLUCOSE 95  < > 172*  < > = values in this interval not  displayed.  Lipid Panel     Component Value Date/Time   CHOL 110 09/12/2016 1124   CHOL 110 02/28/2015 0941   TRIG 119 09/12/2016 1124   HDL 42 09/12/2016 1124   HDL 38 (L) 02/28/2015 0941   CHOLHDL 2.6 09/12/2016 1124   VLDL 24 09/12/2016 1124   LDLCALC 44 09/12/2016 1124   LDLCALC 55 02/28/2015 0941    Recent Labs  09/12/16 1113 09/18/16 1810  BNP >4200.0* >4,500.0*   Thyroid: Lab Results  Component Value Date   TSH 1.597 09/18/2016   Hemoglobin A1C: Lab Results  Component Value Date   HGBA1C 6.8 (H) 09/18/2016    Radiology: Coarse interstitial markings, calcification of the aortic arch  Discharge Medications: Allergies as of 09/24/2016   No Known Allergies     Medication List    STOP taking these medications   metFORMIN 500 MG tablet Commonly known as:  GLUCOPHAGE     TAKE these medications   aspirin 81 MG tablet Take 81 mg by mouth at bedtime.   carvedilol 3.125 MG tablet Commonly known as:  COREG TAKE 1 TABLET (3.125 MG TOTAL) BY MOUTH 2 (TWO) TIMES DAILY WITH A MEAL.   docusate sodium 50 MG capsule Commonly known as:  COLACE Take 50 mg by mouth daily.   ENTRESTO 24-26 MG Generic drug:  sacubitril-valsartan TAKE 1 TABLET TWICE A DAY   TO STRENGTHEN THE HEART.   ferrous sulfate 325 (65 FE) MG tablet Take 1 tablet (325 mg total) by mouth 2 (two) times daily with a meal. What changed:  See the new instructions.   glimepiride 2 MG tablet Commonly known as:  AMARYL TAKE ONE TABLET BY MOUTH ONCE DAILY WITH BREAKFAST TO CONTROL DIABETES   multivitamin tablet Take 1 tablet by mouth daily.   omeprazole 40 MG capsule Commonly known as:  PRILOSEC TAKE ONE CAPSULE BY MOUTH TWICE A DAY FOR ACID REDUCTION   simvastatin 40 MG tablet Commonly known as:  ZOCOR TAKE ONE TABLET BY MOUTH ONCE DAILY FOR CHOLESTEROL   spironolactone 25 MG tablet Commonly known as:  ALDACTONE Take 1 tablet (25 mg total) by mouth daily.   torsemide 20 MG  tablet Commonly known as:  DEMADEX Take 3 tablets (60 mg total) by mouth 2 (two) times daily. What changed:  medication strength  how much to take  how to take this  when to take this  additional instructions       Followup plans and appointments: He made a decision to go to [facility].   He is to follow-up at the nursing home and is to have a metabolic panel  done in one week.  He should be weighed every day.  His diuretics may need to be adjusted depending on his weight.  His discharge weight was 171 pounds.  When he gets out of the nursing home he is to follow-up with me in my office.  Time spent with patient to include physician time:  45 minutes Signed: W. Doristine Church. MD [facility] 09/24/2016, 8:58 AM

## 2016-09-24 NOTE — Progress Notes (Signed)
Attempted to call report to nurse at [facility] without success.  Left message and phone number with secretary. Marcille Blanco, RN

## 2016-09-24 NOTE — Progress Notes (Signed)
Nurse notified by CCMD that patient had 8 beats of vtach.  Dr Wynonia Lawman notified via calling office and talking to [facility].  Okay to discharge patient per Dr Wynonia Lawman. Jimmy Blanco, RN

## 2016-09-24 NOTE — Clinical Social Work Placement (Signed)
   CLINICAL SOCIAL WORK PLACEMENT  NOTE  Date:  09/24/2016  Patient Details  Name: Jimmy Dawson MRN: [id number] Date of Birth: [birth year]  Clinical Social Work is seeking post-discharge placement for this patient at the [place] level of care (*CSW will initial, date and re-position this form in  chart as items are completed):  Yes   Patient/family provided with [facility] [place] Work Department's list of facilities offering this level of care within the geographic area requested by the patient (or if unable, by the patient's family).  Yes   Patient/family informed of their freedom to choose among providers that offer the needed level of care, that participate in Medicare, Medicaid or managed care program needed by the patient, have an available bed and are willing to accept the patient.  Yes   Patient/family informed of [place] [place]'s ownership interest in [facility] and [facility], as well as of the fact that they are under no obligation to receive care at these facilities.  PASRR submitted to EDS on 09/21/16     PASRR number received on       Existing PASRR number confirmed on 09/21/16     FL2 transmitted to all facilities in geographic area requested by pt/family on 09/21/16     FL2 transmitted to all facilities within larger geographic area on       Patient informed that his/her managed care company has contracts with or will negotiate with certain facilities, including the following:        Yes   Patient/family informed of bed offers received.  Patient chooses bed at [facility]     Physician recommends and patient chooses bed at      Patient to be transferred to [facility] on 09/24/16.  Patient to be transferred to facility by [facility]     Patient family notified on 09/24/16 of transfer.  Name of family member notified:  Festus Barren     PHYSICIAN Please prepare prescriptions     Additional Comment:     _______________________________________________ Candie Chroman, LCSW 09/24/2016, 3:01 PM

## 2016-09-24 NOTE — Clinical Social Work Note (Signed)
CSW reviewed bed offers with patient. Patient having difficulty making a decision but after extensive discussion on scores, other patient requests, patient has decided on [facility]. They have a bed available for him today.  Dayton Scrape, [place]

## 2016-09-24 NOTE — Progress Notes (Signed)
Patient report given to nurse [place] at 1610. Marcille Blanco, RN

## 2016-09-26 ENCOUNTER — Telehealth (HOSPITAL_COMMUNITY): Payer: Self-pay | Admitting: Surgery

## 2016-09-26 NOTE — Telephone Encounter (Signed)
I called to check on Jimmy Dawson after his recent hospitalization.  His wife tells me that he is at [facility] and " I guess they are taking care of him".  She says she just had a heart cath and is concerned about her own health.  I requested that she call [facility] concerning her cath and any sort of side effects after.

## 2016-09-27 ENCOUNTER — Telehealth: Payer: Self-pay

## 2016-09-27 NOTE — Telephone Encounter (Signed)
This is a patient of [facility], who was admitted to [facility] after hospitalization. [facility] F/U is needed. Hospital discharge from [facility] on 09/24/16.

## 2016-10-10 ENCOUNTER — Telehealth: Payer: Self-pay | Admitting: Nurse Practitioner

## 2016-10-10 NOTE — Telephone Encounter (Signed)
10/10/16 I spoke with Mrs. Doering, and she stated that her husband has been in the hospital and is now in rehab.  She also stated that his family will be moving him to [place], but she's not sure when and for how long.  She will let us know.

## 2017-04-02 DEATH — deceased

## 2017-05-06 NOTE — Telephone Encounter (Signed)
Error..Jimmy Dawson °
